# Patient Record
Sex: Male | Born: 1970 | Race: White
Health system: Southern US, Community
[De-identification: ages and names within clinical notes are randomized; demographics above are authoritative.]

## PROBLEM LIST (undated history)

## (undated) ENCOUNTER — Emergency Department
Admission: EM | Discharge status: Against Medical Advice | Payer: No Typology Code available for payment source | Attending: Emergency Medicine | Admitting: Emergency Medicine

## (undated) DIAGNOSIS — G4730 Sleep apnea, unspecified: Secondary | ICD-10-CM

## (undated) DIAGNOSIS — I10 Essential (primary) hypertension: Secondary | ICD-10-CM

## (undated) DIAGNOSIS — E785 Hyperlipidemia, unspecified: Secondary | ICD-10-CM

## (undated) DIAGNOSIS — E669 Obesity, unspecified: Secondary | ICD-10-CM

## (undated) DIAGNOSIS — I272 Pulmonary hypertension, unspecified: Secondary | ICD-10-CM

## (undated) DIAGNOSIS — J302 Other seasonal allergic rhinitis: Secondary | ICD-10-CM

## (undated) DIAGNOSIS — Z9289 Personal history of other medical treatment: Secondary | ICD-10-CM

## (undated) HISTORY — DX: Pulmonary hypertension, unspecified: I27.20

## (undated) HISTORY — DX: Sleep apnea, unspecified: G47.30

## (undated) HISTORY — DX: Other seasonal allergic rhinitis: J30.2

## (undated) HISTORY — DX: Personal history of other medical treatment: Z92.89

## (undated) HISTORY — PX: WISDOM TOOTH EXTRACTION: SHX21

## (undated) HISTORY — DX: Hyperlipidemia, unspecified: E78.5

## (undated) HISTORY — DX: Obesity, unspecified: E66.9

---

## 2001-09-05 ENCOUNTER — Ambulatory Visit (INDEPENDENT_AMBULATORY_CARE_PROVIDER_SITE_OTHER): Admit: 2001-09-05 | Disposition: A | Discharge status: Home / Self Care | Payer: Self-pay | Source: Ambulatory Visit

## 2009-02-28 DIAGNOSIS — Z9289 Personal history of other medical treatment: Secondary | ICD-10-CM

## 2009-02-28 HISTORY — DX: Personal history of other medical treatment: Z92.89

## 2009-02-28 HISTORY — PX: OTHER SURGICAL HISTORY: SHX169

## 2010-10-30 DIAGNOSIS — Z9289 Personal history of other medical treatment: Secondary | ICD-10-CM

## 2010-10-30 HISTORY — DX: Personal history of other medical treatment: Z92.89

## 2014-10-23 ENCOUNTER — Ambulatory Visit (INDEPENDENT_AMBULATORY_CARE_PROVIDER_SITE_OTHER): Payer: Self-pay | Admitting: Cardiology

## 2014-10-30 ENCOUNTER — Other Ambulatory Visit: Payer: Self-pay | Admitting: Cardiology

## 2014-10-30 DIAGNOSIS — I119 Hypertensive heart disease without heart failure: Secondary | ICD-10-CM

## 2014-10-30 DIAGNOSIS — Z9289 Personal history of other medical treatment: Secondary | ICD-10-CM

## 2014-10-30 HISTORY — DX: Personal history of other medical treatment: Z92.89

## 2014-11-06 ENCOUNTER — Other Ambulatory Visit (INDEPENDENT_AMBULATORY_CARE_PROVIDER_SITE_OTHER): Payer: Self-pay

## 2014-11-06 ENCOUNTER — Ambulatory Visit
Admission: RE | Admit: 2014-11-06 | Discharge: 2014-11-06 | Disposition: A | Discharge status: Home / Self Care | Payer: BC Managed Care – PPO | Source: Ambulatory Visit | Attending: Cardiology | Admitting: Cardiology

## 2014-11-06 DIAGNOSIS — I119 Hypertensive heart disease without heart failure: Secondary | ICD-10-CM

## 2014-11-06 LAB — VAHRT HISTORIC LVEF: Ejection Fraction: 65 %

## 2014-11-19 ENCOUNTER — Ambulatory Visit (INDEPENDENT_AMBULATORY_CARE_PROVIDER_SITE_OTHER): Payer: Self-pay

## 2014-11-19 ENCOUNTER — Ambulatory Visit (INDEPENDENT_AMBULATORY_CARE_PROVIDER_SITE_OTHER): Payer: Self-pay | Admitting: Nurse Practitioner

## 2014-11-26 ENCOUNTER — Ambulatory Visit (INDEPENDENT_AMBULATORY_CARE_PROVIDER_SITE_OTHER): Payer: Self-pay

## 2014-12-13 ENCOUNTER — Ambulatory Visit (INDEPENDENT_AMBULATORY_CARE_PROVIDER_SITE_OTHER): Payer: Self-pay | Admitting: Cardiovascular Disease

## 2015-11-28 ENCOUNTER — Ambulatory Visit (INDEPENDENT_AMBULATORY_CARE_PROVIDER_SITE_OTHER): Payer: Self-pay | Admitting: Cardiology

## 2015-12-12 ENCOUNTER — Ambulatory Visit (INDEPENDENT_AMBULATORY_CARE_PROVIDER_SITE_OTHER): Payer: Self-pay

## 2016-05-20 ENCOUNTER — Ambulatory Visit (INDEPENDENT_AMBULATORY_CARE_PROVIDER_SITE_OTHER): Payer: Self-pay

## 2016-06-09 ENCOUNTER — Ambulatory Visit (INDEPENDENT_AMBULATORY_CARE_PROVIDER_SITE_OTHER): Payer: Self-pay

## 2016-06-17 ENCOUNTER — Ambulatory Visit (INDEPENDENT_AMBULATORY_CARE_PROVIDER_SITE_OTHER): Payer: Self-pay

## 2016-06-25 ENCOUNTER — Ambulatory Visit (INDEPENDENT_AMBULATORY_CARE_PROVIDER_SITE_OTHER): Payer: Self-pay

## 2016-06-30 ENCOUNTER — Ambulatory Visit (INDEPENDENT_AMBULATORY_CARE_PROVIDER_SITE_OTHER): Payer: Self-pay

## 2016-07-07 ENCOUNTER — Ambulatory Visit (INDEPENDENT_AMBULATORY_CARE_PROVIDER_SITE_OTHER): Payer: Self-pay

## 2016-07-21 ENCOUNTER — Ambulatory Visit (INDEPENDENT_AMBULATORY_CARE_PROVIDER_SITE_OTHER): Payer: Self-pay

## 2016-07-28 ENCOUNTER — Ambulatory Visit (INDEPENDENT_AMBULATORY_CARE_PROVIDER_SITE_OTHER): Payer: Self-pay

## 2016-08-05 ENCOUNTER — Ambulatory Visit (INDEPENDENT_AMBULATORY_CARE_PROVIDER_SITE_OTHER): Payer: Self-pay

## 2016-08-11 ENCOUNTER — Ambulatory Visit (INDEPENDENT_AMBULATORY_CARE_PROVIDER_SITE_OTHER): Payer: Self-pay

## 2016-09-22 ENCOUNTER — Ambulatory Visit (INDEPENDENT_AMBULATORY_CARE_PROVIDER_SITE_OTHER): Payer: Self-pay

## 2016-09-29 ENCOUNTER — Ambulatory Visit (INDEPENDENT_AMBULATORY_CARE_PROVIDER_SITE_OTHER): Payer: Self-pay

## 2016-10-06 ENCOUNTER — Ambulatory Visit (INDEPENDENT_AMBULATORY_CARE_PROVIDER_SITE_OTHER): Payer: Self-pay

## 2016-10-27 ENCOUNTER — Ambulatory Visit (INDEPENDENT_AMBULATORY_CARE_PROVIDER_SITE_OTHER): Payer: Self-pay

## 2016-10-27 ENCOUNTER — Ambulatory Visit (INDEPENDENT_AMBULATORY_CARE_PROVIDER_SITE_OTHER): Payer: Self-pay | Admitting: Cardiology

## 2017-08-10 ENCOUNTER — Emergency Department
Admission: EM | Admit: 2017-08-10 | Discharge: 2017-08-10 | Disposition: A | Discharge status: Home / Self Care | Payer: Commercial Managed Care - POS | Attending: Emergency Medicine | Admitting: Emergency Medicine

## 2017-08-10 ENCOUNTER — Emergency Department: Payer: Commercial Managed Care - POS

## 2017-08-10 DIAGNOSIS — I10 Essential (primary) hypertension: Secondary | ICD-10-CM | POA: Insufficient documentation

## 2017-08-10 DIAGNOSIS — Z79899 Other long term (current) drug therapy: Secondary | ICD-10-CM | POA: Insufficient documentation

## 2017-08-10 DIAGNOSIS — R079 Chest pain, unspecified: Secondary | ICD-10-CM

## 2017-08-10 HISTORY — DX: Essential (primary) hypertension: I10

## 2017-08-10 LAB — COMPREHENSIVE METABOLIC PANEL
ALT: 38 U/L (ref 0–55)
AST (SGOT): 22 U/L (ref 5–34)
Albumin/Globulin Ratio: 1.8 (ref 0.9–2.2)
Albumin: 4.4 g/dL (ref 3.5–5.0)
Alkaline Phosphatase: 67 U/L (ref 38–106)
Anion Gap: 10 (ref 5.0–15.0)
BUN: 22.8 mg/dL (ref 9.0–28.0)
Bilirubin, Total: 0.5 mg/dL (ref 0.2–1.2)
CO2: 23 mEq/L (ref 22–29)
Calcium: 9.6 mg/dL (ref 8.5–10.5)
Chloride: 106 mEq/L (ref 100–111)
Creatinine: 1.1 mg/dL (ref 0.7–1.3)
Globulin: 2.4 g/dL (ref 2.0–3.6)
Glucose: 87 mg/dL (ref 70–100)
Potassium: 4.1 mEq/L (ref 3.5–5.1)
Protein, Total: 6.8 g/dL (ref 6.0–8.3)
Sodium: 139 mEq/L (ref 136–145)

## 2017-08-10 LAB — PT AND APTT
PT INR: 1 (ref 0.9–1.1)
PT: 13 s (ref 12.6–15.0)
PTT: 27 s (ref 23–37)

## 2017-08-10 LAB — CBC AND DIFFERENTIAL
Absolute NRBC: 0 10*3/uL (ref 0.00–0.00)
Basophils Absolute Automated: 0.04 10*3/uL (ref 0.00–0.08)
Basophils Automated: 0.6 %
Eosinophils Absolute Automated: 0.16 10*3/uL (ref 0.00–0.44)
Eosinophils Automated: 2.3 %
Hematocrit: 38.9 % (ref 37.6–49.6)
Hgb: 13.2 g/dL (ref 12.5–17.1)
Immature Granulocytes Absolute: 0.01 10*3/uL (ref 0.00–0.07)
Immature Granulocytes: 0.1 %
Lymphocytes Absolute Automated: 2.3 10*3/uL (ref 0.42–3.22)
Lymphocytes Automated: 32.9 %
MCH: 29.8 pg (ref 25.1–33.5)
MCHC: 33.9 g/dL (ref 31.5–35.8)
MCV: 87.8 fL (ref 78.0–96.0)
MPV: 10.4 fL (ref 8.9–12.5)
Monocytes Absolute Automated: 0.44 10*3/uL (ref 0.21–0.85)
Monocytes: 6.3 %
Neutrophils Absolute: 4.05 10*3/uL (ref 1.10–6.33)
Neutrophils: 57.8 %
Nucleated RBC: 0 /100 WBC (ref 0.0–0.0)
Platelets: 222 10*3/uL (ref 142–346)
RBC: 4.43 10*6/uL (ref 4.20–5.90)
RDW: 13 % (ref 11–15)
WBC: 7 10*3/uL (ref 3.10–9.50)

## 2017-08-10 LAB — GFR: EGFR: >60

## 2017-08-10 LAB — B-TYPE NATRIURETIC PEPTIDE: B-Natriuretic Peptide: 15.9 pg/mL (ref 0.0–100.0)

## 2017-08-10 LAB — TROPONIN I
Troponin I: <0.01 ng/mL (ref 0.00–0.09)
Troponin I: <0.01 ng/mL (ref 0.00–0.09)

## 2017-08-10 LAB — MAGNESIUM: Magnesium: 2.2 mg/dL (ref 1.6–2.6)

## 2017-08-10 LAB — IHS D-DIMER: D-Dimer: 0.32 ug/mL FEU (ref 0.00–0.50)

## 2017-08-10 LAB — TSH: TSH: 2.75 u[IU]/mL (ref 0.35–4.94)

## 2017-08-10 MED ORDER — ASPIRIN 81 MG PO CHEW
324.00 mg | CHEWABLE_TABLET | Freq: Once | ORAL | Status: AC
Start: 2017-08-10 — End: 2017-08-10
  Administered 2017-08-10: 324 mg via ORAL
  Filled 2017-08-10: qty 4

## 2017-08-10 MED ORDER — KETOROLAC TROMETHAMINE 30 MG/ML IJ SOLN
30.00 mg | Freq: Once | INTRAMUSCULAR | Status: DC
Start: 2017-08-10 — End: 2017-08-11
  Filled 2017-08-10: qty 1

## 2017-08-10 NOTE — Discharge Instructions (Signed)
Chest Pain of Unclear Etiology    You have been seen for chest pain. The cause of your pain is not yet known.    Your doctor has learned about your medical history, examined you, and checked any tests that were done. Still, it is unclear why you are having pain. The doctor thinks there is only a very small chance that your pain is caused by a life-threatening condition. Later, your primary care doctor might do more tests or check you again.    Sometimes chest pain is caused by a dangerous condition, like a heart attack, aorta injury, blood clot in the lung, or collapsed lung. It is unlikely that your pain is caused by a life-threatening condition if: Your chest pain lasts only a few seconds at a time; you are not short of breath, nauseated (sick to your stomach), sweaty, or lightheaded; your pain gets worse when you twist or bend; your pain improves with exercise or hard work.    Chest pain is serious. It is VERY IMPORTANT that you follow up with your regular doctor and seek medical attention immediately here or at the nearest Emergency Department if your symptoms become worse or they change.    YOU SHOULD SEEK MEDICAL ATTENTION IMMEDIATELY, EITHER HERE OR AT THE NEAREST EMERGENCY DEPARTMENT, IF ANY OF THE FOLLOWING OCCURS:   Your pain gets worse.   Your pain makes you short of breath, nauseated, or sweaty.   Your pain gets worse when you walk, go up stairs, or exert yourself.   You feel weak, lightheaded, or faint.   It hurts to breathe.   Your leg swells.   Your symptoms get worse or you have new symptoms or concerns.             Rest, fluids.  Tylenol or ibuprofen for pain, fever.   Daily aspirin 81 mg until cardiology recheck.  Activity as tolerated.

## 2017-08-10 NOTE — ED Triage Notes (Signed)
mid chest pain started today at 1600, worse with deep breath and pushing on chest.

## 2017-08-10 NOTE — ED Provider Notes (Signed)
Physician/Midlevel provider first contact with patient: 08/10/17 1942         History     Chief Complaint   Patient presents with   . Chest Pain       Chief Complaint:  Chest pain  Location:  Left mid-lower sternal  Onset:  1600 hrs x dop  Character:  Aching to sharp  Aggravating/Alleviating Factors:  Aggravated by coughing, direct palpation.  ass'd anxious.  No sx tx or alleviating factors  Timing:  intermittent  Environment:  driving  Severity:  Mild to moderate  Context:  Pt, drove alone to ED, c/o chest pain.  He states while driving he had sudden onset of left mid to lower sternal pain w/o radiation.  Pt felt anxious with onset of pain.  He reports increase with palpation and coughing.  No sx tx.  Pt called treating cardiology group, but did not hear back    Pt had air travel to [place] in past week.    Pt reports EST and chemical stress test in 2018, unremarkable.    PMH:  HTN, cholesterol unknown    FH:  Father with cardiac dz    Denies HA, chills, sweats, dyspnea, palps, URI sx, NVD, melena, weakness, numbness, vertigo, swelling, PND, orthopnea    PMD    Cards Kulangara, s      The history is provided by the patient and medical records. No language interpreter was used.            Past Medical History:   Diagnosis Date   . Hypertension        History reviewed. No pertinent surgical history.    History reviewed. No pertinent family history.    Social  Social History   Substance Use Topics   . Smoking status: Never Smoker   . Smokeless tobacco: Never Used   . Alcohol use Yes       .     No Known Allergies    Home Medications     Med List Status:  In Progress Set By: Otis Brace, RN at 08/10/2017  7:32 PM                lisinopril (PRINIVIL,ZESTRIL) 10 MG tablet     Take 10 mg by mouth daily.           Review of Systems   Constitutional: Negative for activity change, chills, diaphoresis and fever.   HENT: Negative for congestion, nosebleeds, rhinorrhea, sore throat, trouble swallowing and voice  change.    Eyes: Negative for pain, discharge and redness.   Respiratory: Positive for chest tightness. Negative for cough, shortness of breath and wheezing.    Cardiovascular: Negative for chest pain, palpitations and leg swelling.   Gastrointestinal: Negative for abdominal pain, blood in stool, diarrhea, nausea and vomiting.   Endocrine: Negative for polydipsia, polyphagia and polyuria.   Genitourinary: Negative for dysuria, frequency, hematuria and urgency.   Musculoskeletal: Negative for arthralgias, back pain, myalgias and neck pain.   Skin: Negative for color change, rash and wound.   Allergic/Immunologic: Negative for environmental allergies, food allergies and immunocompromised state.   Neurological: Negative for dizziness, tremors, syncope, weakness, light-headedness, numbness and headaches.   Hematological: Negative for adenopathy. Does not bruise/bleed easily.   Psychiatric/Behavioral: Negative for agitation and confusion. The patient is nervous/anxious.    All other systems reviewed and are negative.      Physical Exam    BP: (!) 141/94, Heart Rate: 73, Temp:  98 F (36.7 C), Resp Rate: 16, SpO2: 99 %, Weight: 118.4 kg    Physical Exam   Constitutional: He is oriented to person, place, and time. He appears well-developed and well-nourished. He is cooperative.  Non-toxic appearance. He does not appear ill. No distress.   HENT:   Head: Normocephalic and atraumatic.   Right Ear: External ear normal.   Left Ear: External ear normal.   Nose: Nose normal. No mucosal edema or rhinorrhea. Right sinus exhibits no maxillary sinus tenderness and no frontal sinus tenderness. Left sinus exhibits no maxillary sinus tenderness and no frontal sinus tenderness.   Mouth/Throat: Oropharynx is clear and moist. No oropharyngeal exudate, posterior oropharyngeal edema, posterior oropharyngeal erythema or tonsillar abscesses.   Eyes: Pupils are equal, round, and reactive to light. Conjunctivae and lids are normal. Right eye  exhibits no discharge. Left eye exhibits no discharge. Right conjunctiva is not injected. Left conjunctiva is not injected. No scleral icterus.   Neck: Normal range of motion. Neck supple. No thyromegaly present.   Cardiovascular: Normal rate, regular rhythm, normal heart sounds and intact distal pulses.    No murmur heard.  Pulses:       Radial pulses are 2+ on the right side.   Pulmonary/Chest: Effort normal and breath sounds normal. No respiratory distress. He has no wheezes. He has no rales. Chest wall is not dull to percussion. He exhibits tenderness. He exhibits no mass, no bony tenderness, no crepitus, no edema, no swelling and no retraction.       No splinting with respirations.   Abdominal: Soft. Bowel sounds are normal. He exhibits no distension, no abdominal bruit, no ascites, no pulsatile midline mass and no mass. There is no hepatosplenomegaly. There is no tenderness. There is no rigidity, no rebound, no guarding and no CVA tenderness. No hernia.   Musculoskeletal: Normal range of motion. He exhibits no edema or tenderness.   Lymphadenopathy:     He has no cervical adenopathy.   Neurological: He is alert and oriented to person, place, and time. No sensory deficit. He exhibits normal muscle tone. Coordination and gait normal.   Skin: Skin is warm and dry. Capillary refill takes less than 2 seconds. No abrasion, no ecchymosis, no laceration, no lesion, no petechiae and no rash noted. He is not diaphoretic. No erythema. Nails show no clubbing.   Psychiatric: He has a normal mood and affect. His speech is normal and behavior is normal. Judgment and thought content normal. Cognition and memory are normal.         MDM and ED Course     ED Medication Orders     Start Ordered     Status Ordering Provider    08/10/17 2034 08/10/17 2033    Once     Route: Intravenous  Ordered Dose: 30 mg     Discontinued Elisama Thissen A    08/10/17 1956 08/10/17 1955  aspirin chewable tablet 324 mg  Once     Route: Oral  Ordered  Dose: 324 mg     Last MAR action:  Given Chamaine Stankus A             MDM  Number of Diagnoses or Management Options  Chest pain in adult: new and requires workup  Diagnosis management comments: Plan:  IV, labs, ekg, monitoring, imaging, meds, supportive care, reassess.    2040  Patient reassessment.  Lab results reviewed.  Initial troponin is negative.  Patient is resting quietly and comfortably.  No additional complaints.  Pain is resolved.  Exam is otherwise unchanged.  Patient advised that we will need to repeat a 6 hour post-onset of symptoms.  Troponin.  He is amenable.  This will be redrawn at 2200 hrs.  On occur.  Vital signs, cardiopulmonary status.    2235  Repeat troponin is reviewed with patient.  He remains pain-free.    His questions have been answered.  He reports understanding follow-up instructions.  Strict return precautions bites.  He is ready for discharge.    The attending signature signifies review and agreement of the history , PE, evaluation, clinical impression and discharge plan except as otherwise noted.    I,Farid Grigorian, PA-C, have been the primary provider for Guy Martinez during this Emergency Dept visit.    Oxygen saturation by pulse oximetry is 95%-100%, Normal.  Interventions: None Needed.    DDX to include, but not limited to:  Cp, angina, arrhythmia, PE, CHF, costochondritis, less likely PE, pna, PTX  Plan:  Supportive care, daily 81 mg ASA, cards referral.    Results     Procedure Component Value Units Date/Time    Troponin I ([id number]) Collected:  08/10/17 2200    Specimen:  Blood Updated:  08/10/17 2232     Troponin I <0.01 ng/mL     TSH ([id number]) Collected:  08/10/17 2001    Specimen:  Blood Updated:  08/10/17 2048     TSH 2.75 uIU/mL     B-type Natriuretic Peptide ([id number]) Collected:  08/10/17 2003    Specimen:  Blood Updated:  08/10/17 2032     B-Natriuretic Peptide 15.9 pg/mL     Troponin I ([id number]) Collected:  08/10/17 1959    Specimen:  Blood Updated:  08/10/17  2032     Troponin I <0.01 ng/mL     Comprehensive metabolic panel ([id number]) Collected:  08/10/17 2001    Specimen:  Blood Updated:  08/10/17 2029     Glucose 87 mg/dL      BUN 24.4 mg/dL      Creatinine 1.1 mg/dL      Sodium 010 mEq/L      Potassium 4.1 mEq/L      Chloride 106 mEq/L      CO2 23 mEq/L      Calcium 9.6 mg/dL      Protein, Total 6.8 g/dL      Albumin 4.4 g/dL      AST (SGOT) 22 U/L      ALT 38 U/L      Alkaline Phosphatase 67 U/L      Bilirubin, Total 0.5 mg/dL      Globulin 2.4 g/dL      Albumin/Globulin Ratio 1.8     Anion Gap 10.0    Magnesium ([id number]) Collected:  08/10/17 2001    Specimen:  Blood Updated:  08/10/17 2029     Magnesium 2.2 mg/dL     GFR ([id number]) Collected:  08/10/17 2001     Updated:  08/10/17 2029     EGFR >60.0    D-Dimer ([id number]) Collected:  08/10/17 1959     Updated:  08/10/17 2023     D-Dimer 0.32 ug/mL FEU     PT/APTT ([id number]) Collected:  08/10/17 1959     Updated:  08/10/17 2023     PT 13.0 sec      PT INR 1.0     PT Anticoag. Given Within 48 hrs. None     PTT 27 sec     CBC  with differential ([id number]) Collected:  08/10/17 2001    Specimen:  Blood from Blood Updated:  08/10/17 2010     WBC 7.00 x10 3/uL      Hgb 13.2 g/dL      Hematocrit 30.8 %      Platelets 222 x10 3/uL      RBC 4.43 x10 6/uL      MCV 87.8 fL      MCH 29.8 pg      MCHC 33.9 g/dL      RDW 13 %      MPV 10.4 fL      Neutrophils 57.8 %      Lymphocytes Automated 32.9 %      Monocytes 6.3 %      Eosinophils Automated 2.3 %      Basophils Automated 0.6 %      Immature Granulocyte 0.1 %      Nucleated RBC 0.0 /100 WBC      Neutrophils Absolute 4.05 x10 3/uL      Abs Lymph Automated 2.30 x10 3/uL      Abs Mono Automated 0.44 x10 3/uL      Abs Eos Automated 0.16 x10 3/uL      Absolute Baso Automated 0.04 x10 3/uL      Absolute Immature Granulocyte 0.01 x10 3/uL      Absolute NRBC 0.00 x10 3/uL         Radiology Results (24 Hour)     Procedure Component Value Units Date/Time    Chest 2 Views  ([id number]) Collected:  08/10/17 2024    Order Status:  Completed Updated:  08/10/17 2029    Narrative:       Clinical History: Chest pain.    Findings: PA and lateral views of the chest. No prior studies are  available for comparison.    Central silhouette and pulmonary vascularity are within normal limits.     No focal consolidation or pleural effusion.    Minimal degenerative changes in the thoracic spine.      Impression:        No evidence of acute cardiopulmonary disease.    Darra Lis, MD   08/10/2017 8:25 PM        I have reviewed all labs and/or radiological studies. I have reviewed all xrays if any myself on the PACS system.      " *This note was generated by the [employer] EMR system/ Dragon speech recognition and   may contain inherent errors or omissions not intended by the user. Grammatical    errors, random word insertions, deletions, pronoun errors and incomplete   sentences are occasional consequences of this technology due to software   limitations. Not all errors are caught or corrected. If there are questions or    concerns about the content of this note or information contained within the body   of this dictation they should be addressed directly with the author for   Clarification."    Wells Score      Value   Previous DVT or PE  0   HR greater than 100  0   Surgery within 4 weeks, or Immobilization in last 3 days  0   Clinical Signs/Symptoms of DVT  0   Alternative diagnosis less likely than PE  0   Hemoptysis  0   Malignancy with treatment within 6 months or palliative care  0   Wells Score for PE  0  PERC Score      Value   Age (read only)  less than or equal to 50 years   HR >= 100  0   O2 Sat on Room Air < 95%  0   Prior History of Venous Thromboembolism  0   Trauma or Surgery within 4 weeks  0   Hemoptysis  0   Exogenous Estrogen  0   Unilateral Leg Swelling  0   PERC Rule Score (Calculated)  0      Heart Score      Value   History  0   EKG  0   Risk Factors  1   Total (with  age)  2   Onset of pain (time of START of last episode of chest pain)?  3-6 hrs ago        EKG interpreted by ED physician assistant    Rate: Normal  Rhythm: sinus rhythm  Axis: Normal  ST Segments: No acute ST segment changes  T waves: No acute T Wave changes  Conduction: No blocks  Impression: Normal EKG    No previous EKG present for comparison       Amount and/or Complexity of Data Reviewed  Clinical lab tests: ordered and reviewed  Tests in the radiology section of CPT: ordered and reviewed (  Chest 2 Views (Final result)   Result time 08/10/17 20:25:17   Final result by Betti Cruz, MD (08/10/17 20:25:17)             Impression:     No evidence of acute cardiopulmonary disease.    Darra Lis, MD   08/10/2017 8:25 PM        Narrative:     Clinical History: Chest pain.    Findings: PA and lateral views of the chest. No prior studies are  available for comparison.    Central silhouette and pulmonary vascularity are within normal limits.     No focal consolidation or pleural effusion.    Minimal degenerative changes in the thoracic spine.          )  Tests in the medicine section of CPT: ordered and reviewed  Review and summarize past medical records: yes  Discuss the patient with other providers: yes (ED case d/w attending, potluri, j, who agrees with the current course, tx, and disposition plan.)  Independent visualization of images, tracings, or specimens: yes    Risk of Complications, Morbidity, and/or Mortality  Presenting problems: high  Diagnostic procedures: high  Management options: high    Patient Progress  Patient progress: stable               Wells Score      Value   Previous DVT or PE  0   HR greater than 100  0   Surgery within 4 weeks, or Immobilization in last 3 days  0   Clinical Signs/Symptoms of DVT  0   Alternative diagnosis less likely than PE  0   Hemoptysis  0   Malignancy with treatment within 6 months or palliative care  0   Wells Score for PE  0      PERC Score       Value   Age (read only)  less than or equal to 50 years   HR >= 100  0   O2 Sat on Room Air < 95%  0   Prior History of Venous Thromboembolism  0   Trauma or Surgery within  4 weeks  0   Hemoptysis  0   Exogenous Estrogen  0   Unilateral Leg Swelling  0   PERC Rule Score (Calculated)  0      Heart Score      Value   History  0   EKG  0   Risk Factors  1   Total (with age)  2   Onset of pain (time of START of last episode of chest pain)?  3-6 hrs ago          Procedures    Clinical Impression & Disposition     Clinical Impression  Final diagnoses:   Chest pain in adult        ED Disposition     ED Disposition Condition Date/Time Comment    Discharge  Wed Aug 10, 2017 10:48 PM Guy Martinez discharge to home/self care.    Condition at disposition: Stable           Discharge Medication List as of 08/10/2017 10:49 PM                    Missi Mcmackin, Selinda Flavin, PA  08/11/17 1943       Earline Mayotte, MD  08/12/17 1707

## 2017-08-11 LAB — ECG 12-LEAD
Atrial Rate: 64 {beats}/min
P Axis: 42 degrees
P-R Interval: 186 ms
Q-T Interval: 398 ms
QRS Duration: 100 ms
QTC Calculation (Bezet): 410 ms
R Axis: 48 degrees
T Axis: 29 degrees
Ventricular Rate: 64 {beats}/min

## 2018-08-23 ENCOUNTER — Ambulatory Visit (INDEPENDENT_AMBULATORY_CARE_PROVIDER_SITE_OTHER): Payer: Self-pay | Admitting: Cardiology

## 2019-07-02 ENCOUNTER — Emergency Department: Payer: Commercial Managed Care - POS

## 2019-07-02 ENCOUNTER — Emergency Department
Admission: EM | Admit: 2019-07-02 | Discharge: 2019-07-03 | Disposition: A | Discharge status: Home / Self Care | Payer: Commercial Managed Care - POS | Attending: Emergency Medicine | Admitting: Emergency Medicine

## 2019-07-02 DIAGNOSIS — N281 Cyst of kidney, acquired: Secondary | ICD-10-CM | POA: Insufficient documentation

## 2019-07-02 DIAGNOSIS — I10 Essential (primary) hypertension: Secondary | ICD-10-CM | POA: Insufficient documentation

## 2019-07-02 DIAGNOSIS — S0031XA Abrasion of nose, initial encounter: Secondary | ICD-10-CM | POA: Insufficient documentation

## 2019-07-02 DIAGNOSIS — Z23 Encounter for immunization: Secondary | ICD-10-CM | POA: Insufficient documentation

## 2019-07-02 DIAGNOSIS — S0081XA Abrasion of other part of head, initial encounter: Secondary | ICD-10-CM

## 2019-07-02 DIAGNOSIS — K4090 Unilateral inguinal hernia, without obstruction or gangrene, not specified as recurrent: Secondary | ICD-10-CM | POA: Insufficient documentation

## 2019-07-02 DIAGNOSIS — S39012A Strain of muscle, fascia and tendon of lower back, initial encounter: Secondary | ICD-10-CM

## 2019-07-02 DIAGNOSIS — Y9323 Activity, snow (alpine) (downhill) skiing, snow boarding, sledding, tobogganing and snow tubing: Secondary | ICD-10-CM | POA: Insufficient documentation

## 2019-07-02 DIAGNOSIS — S301XXA Contusion of abdominal wall, initial encounter: Secondary | ICD-10-CM

## 2019-07-02 LAB — URINALYSIS, REFLEX TO MICROSCOPIC EXAM IF INDICATED
Bilirubin, UA: NEGATIVE
Blood, UA: NEGATIVE
Glucose, UA: NEGATIVE
Ketones UA: NEGATIVE
Leukocyte Esterase, UA: NEGATIVE
Nitrite, UA: NEGATIVE
Protein, UR: 30 — AB
Specific Gravity UA: 1.025 (ref 1.001–1.035)
Urine pH: 5 (ref 5.0–8.0)
Urobilinogen, UA: NORMAL mg/dL (ref 0.2–2.0)

## 2019-07-02 LAB — CBC WITH MANUAL DIFFERENTIAL
Absolute NRBC: 0 10*3/uL (ref 0.00–0.00)
Band Neutrophils Absolute: 0.56 10*3/uL (ref 0.00–1.00)
Band Neutrophils: 6 %
Basophils Absolute Manual: 0 10*3/uL (ref 0.00–0.08)
Basophils Manual: 0 %
Cell Morphology: NORMAL
Eosinophils Absolute Manual: 0.09 10*3/uL (ref 0.00–0.44)
Eosinophils Manual: 1 %
Hematocrit: 40.2 % (ref 37.6–49.6)
Hgb: 13.9 g/dL (ref 12.5–17.1)
Lymphocytes Absolute Manual: 2.89 10*3/uL (ref 0.42–3.22)
Lymphocytes Manual: 31 %
MCH: 30.3 pg (ref 25.1–33.5)
MCHC: 34.6 g/dL (ref 31.5–35.8)
MCV: 87.6 fL (ref 78.0–96.0)
MPV: 9.9 fL (ref 8.9–12.5)
Monocytes Absolute: 0.09 10*3/uL — ABNORMAL LOW (ref 0.21–0.85)
Monocytes Manual: 1 %
Neutrophils Absolute Manual: 5.69 10*3/uL (ref 1.10–6.33)
Nucleated RBC: 0 /100 WBC (ref 0.0–0.0)
Platelet Estimate: NORMAL
Platelets: 215 10*3/uL (ref 142–346)
RBC: 4.59 10*6/uL (ref 4.20–5.90)
RDW: 13 % (ref 11–15)
Segmented Neutrophils: 61 %
WBC: 9.32 10*3/uL (ref 3.10–9.50)

## 2019-07-02 LAB — COMPREHENSIVE METABOLIC PANEL
ALT: 49 U/L (ref 0–55)
AST (SGOT): 32 U/L (ref 5–34)
Albumin/Globulin Ratio: 2 (ref 0.9–2.2)
Albumin: 4.4 g/dL (ref 3.5–5.0)
Alkaline Phosphatase: 60 U/L (ref 38–106)
Anion Gap: 9 (ref 5.0–15.0)
BUN: 17.1 mg/dL (ref 9.0–28.0)
Bilirubin, Total: 1 mg/dL (ref 0.2–1.2)
CO2: 23 mEq/L (ref 22–29)
Calcium: 9 mg/dL (ref 8.5–10.5)
Chloride: 106 mEq/L (ref 100–111)
Creatinine: 1 mg/dL (ref 0.7–1.3)
Globulin: 2.2 g/dL (ref 2.0–3.6)
Glucose: 93 mg/dL (ref 70–100)
Potassium: 4 mEq/L (ref 3.5–5.1)
Protein, Total: 6.6 g/dL (ref 6.0–8.3)
Sodium: 138 mEq/L (ref 136–145)

## 2019-07-02 LAB — PT AND APTT
PT INR: 1 (ref 0.9–1.1)
PT: 12.8 s (ref 12.6–15.0)
PTT: 25 s (ref 23–37)

## 2019-07-02 LAB — GFR: EGFR: >60

## 2019-07-02 LAB — LIPASE: Lipase: 7 U/L — ABNORMAL LOW (ref 8–78)

## 2019-07-02 MED ORDER — FENTANYL CITRATE (PF) 50 MCG/ML IJ SOLN (WRAP)
50.00 ug | Freq: Once | INTRAMUSCULAR | Status: AC
Start: 2019-07-02 — End: 2019-07-02
  Administered 2019-07-02: 50 ug via INTRAVENOUS
  Filled 2019-07-02: qty 2

## 2019-07-02 MED ORDER — ONDANSETRON HCL 4 MG/2ML IJ SOLN
4.00 mg | Freq: Once | INTRAMUSCULAR | Status: AC
Start: 2019-07-02 — End: 2019-07-02
  Administered 2019-07-02: 4 mg via INTRAVENOUS
  Filled 2019-07-02: qty 2

## 2019-07-02 MED ORDER — MORPHINE SULFATE 2 MG/ML IJ/IV SOLN (WRAP)
4.0000 mg | Freq: Once | Status: AC
Start: 2019-07-02 — End: 2019-07-02
  Administered 2019-07-02: 4 mg via INTRAVENOUS
  Filled 2019-07-02: qty 2

## 2019-07-02 MED ORDER — KETOROLAC TROMETHAMINE 15 MG/ML IJ SOLN
15.00 mg | Freq: Once | INTRAMUSCULAR | Status: AC
Start: 2019-07-02 — End: 2019-07-02
  Administered 2019-07-02: 15 mg via INTRAVENOUS
  Filled 2019-07-02: qty 1

## 2019-07-02 MED ORDER — SODIUM CHLORIDE 0.9 % IV BOLUS
1000.00 mL | Freq: Once | INTRAVENOUS | Status: AC
Start: 2019-07-02 — End: 2019-07-03
  Administered 2019-07-02: 1000 mL via INTRAVENOUS

## 2019-07-02 MED ORDER — TETANUS-DIPHTH-ACELL PERTUSSIS 5-2.5-18.5 LF-MCG/0.5 IM SUSP
0.50 mL | Freq: Once | INTRAMUSCULAR | Status: AC
Start: 2019-07-02 — End: 2019-07-02
  Administered 2019-07-02: 0.5 mL via INTRAMUSCULAR
  Filled 2019-07-02: qty 0.5

## 2019-07-02 MED ORDER — CYCLOBENZAPRINE HCL 10 MG PO TABS
10.00 mg | ORAL_TABLET | Freq: Once | ORAL | Status: AC
Start: 2019-07-02 — End: 2019-07-02
  Administered 2019-07-02: 10 mg via ORAL
  Filled 2019-07-02: qty 1

## 2019-07-02 MED ORDER — IODIXANOL 320 MG/ML IV SOLN
100.00 mL | Freq: Once | INTRAVENOUS | Status: AC | PRN
Start: 2019-07-02 — End: 2019-07-02
  Administered 2019-07-02: 100 mL via INTRAVENOUS

## 2019-07-02 NOTE — ED Triage Notes (Signed)
Pt states he had a skiing accident. Pt states at 1430 he fell into rocks and ice boulders while going down hill. Helmet was dented but not cracked open. Redness noted to nose from goggles. C/o RLQ/LLQ pain. Stated pain 7/10-advil taken at 1700 with some relief. Patient has been questioned about the presence of weapons so that these items may be recovered and secured appropriately. Denies the current possession of any weapons.

## 2019-07-02 NOTE — ED Provider Notes (Addendum)
Nursing (triage) note reviewed for the following pertinent information:         History     Chief Complaint   Patient presents with   . ski accident     49 year old male presents to the emergency department with his wife after ski accident earlier today.  He said he was skiing on the side did not realize there is only 6 inches over a bunch of boulders.  He fell face forward.  He is unsure if he hit his head.  He has an abrasion on his nose.  His main pain is along his lower abdomen and his lower back.  No blood thinners.  Pain is moderate to severe.  Is worse with movement.  No chest pain.  No blood in his urine.  No arm or leg pain.  No neck pain.  He was wearing a helmet.    The history is provided by the patient.        Past Medical History:   Diagnosis Date   . Hypertension        History reviewed. No pertinent surgical history.    History reviewed. No pertinent family history.    Social  Social History     Tobacco Use   . Smoking status: Never Smoker   . Smokeless tobacco: Never Used   Substance Use Topics   . Alcohol use: Not Currently   . Drug use: No       .     No Known Allergies    Home Medications     Med List Status: In Progress Set By: Elberta Leatherwood, RN at 07/02/2019  8:46 PM                lisinopril (PRINIVIL,ZESTRIL) 10 MG tablet     Take 10 mg by mouth daily.           Review of Systems   Constitutional: Negative for chills and fever.   HENT: Negative for congestion, rhinorrhea and sore throat.    Respiratory: Negative for cough, chest tightness and shortness of breath.    Cardiovascular: Negative for chest pain and palpitations.   Gastrointestinal: Positive for abdominal pain. Negative for diarrhea, nausea and vomiting.   Genitourinary: Negative for dysuria and frequency.   Musculoskeletal: Positive for back pain. Negative for myalgias.   Skin: Negative for color change and rash.   Neurological: Negative for dizziness, weakness and headaches.   Psychiatric/Behavioral: Negative for confusion. The  patient is not nervous/anxious.        Physical Exam    BP: (!) 165/108, Heart Rate: 77, Temp: 97.8 F (36.6 C), Resp Rate: 16, SpO2: 99 %, Weight: 126 kg    Physical Exam  Vitals signs and nursing note reviewed.   Constitutional:       Appearance: He is well-developed.   HENT:      Head: Normocephalic.      Comments: Abrasion along bridge of nose  Eyes:      General:         Right eye: No discharge.         Left eye: No discharge.      Conjunctiva/sclera: Conjunctivae normal.      Pupils: Pupils are equal, round, and reactive to light.   Neck:      Musculoskeletal: Normal range of motion and neck supple.      Comments: Nontender full range of motion  Cardiovascular:      Rate and Rhythm: Normal  rate and regular rhythm.      Heart sounds: Normal heart sounds.   Pulmonary:      Effort: Pulmonary effort is normal. No respiratory distress.      Breath sounds: Normal breath sounds. No wheezing or rales.   Abdominal:      General: There is no distension.      Palpations: Abdomen is soft.      Tenderness: There is abdominal tenderness. There is no guarding or rebound.      Comments: Diffusely tender especially lower abdomen   Musculoskeletal: Normal range of motion.         General: No tenderness.      Comments: Tender lumbar spine, no step-offs   Skin:     General: Skin is warm and dry.   Neurological:      General: No focal deficit present.      Mental Status: He is alert and oriented to person, place, and time.      Cranial Nerves: No cranial nerve deficit.   Psychiatric:         Mood and Affect: Mood normal.         Behavior: Behavior normal.         Thought Content: Thought content normal.         Judgment: Judgment normal.           MDM and ED Course     ED Medication Orders (From admission, onward)    Start Ordered     Status Ordering Provider    07/03/19 0015 07/03/19 0014  HYDROcodone-acetaminophen (NORCO) 5-325 MG per tablet 2 tablet  Once     Route: Oral  Ordered Dose: 2 tablet     Last MAR action: Given Madelyn Tlatelpa,  Teniqua Marron H    07/03/19 0015 07/03/19 0014  ondansetron (ZOFRAN-ODT) disintegrating tablet 4 mg  Once     Route: Oral  Ordered Dose: 4 mg     Last MAR action: Given Vasilisa Vore H    07/02/19 2319 07/02/19 2318  ketorolac (TORADOL) injection 15 mg  Once     Route: Intravenous  Ordered Dose: 15 mg     Last MAR action: Given Mehgan Santmyer H    07/02/19 2309 07/02/19 2308  cyclobenzaprine (FLEXERIL) tablet 10 mg  Once     Route: Oral  Ordered Dose: 10 mg     Last MAR action: Given Jishnu Jenniges H    07/02/19 2222 07/02/19 2222  iodixanol (VISIPAQUE) 320 MG/ML injection 100 mL  IMG once as needed     Route: Intravenous  Ordered Dose: 100 mL     Last MAR action: Imaging Agent Given Siearra Amberg H    07/02/19 2200 07/02/19 2159  morphine injection 4 mg  Once     Route: Intravenous  Ordered Dose: 4 mg     Last MAR action: Given Huey Scalia H    07/02/19 2057 07/02/19 2056  tetanus-diphth-acell pertussis (BOOSTRIX) injection 0.5 mL  Once     Route: Intramuscular  Ordered Dose: 0.5 mL     Last MAR action: Given Jerre Vandrunen H    07/02/19 2056 07/02/19 2055  fentaNYL (PF) (SUBLIMAZE) injection 50 mcg  Once     Route: Intravenous  Ordered Dose: 50 mcg     Last MAR action: Given Deatrice Spanbauer H    07/02/19 2056 07/02/19 2055  ondansetron (ZOFRAN) injection 4 mg  Once     Route: Intravenous  Ordered Dose: 4 mg  Last MAR action: Given Leticia Clas    07/02/19 2056 07/02/19 2055  sodium chloride 0.9 % bolus 1,000 mL  Once     Route: Intravenous  Ordered Dose: 1,000 mL     Last MAR action: Stopped Keiston Manley H             MDM  Number of Diagnoses or Management Options  Abrasion of face, initial encounter:   Contusion of abdominal wall, initial encounter:   Cyst of left kidney:   Essential hypertension:   Injury due to skiing accident, initial encounter:   Strain of lumbar region, initial encounter:   Unilateral inguinal hernia without obstruction or gangrene, recurrence not specified:   Diagnosis management comments: Differential diagnosis: Intra-abdominal  injury, lumbar fracture, rule out head or face injury  Plan: Labs, CAT scan, supportive care    I, Nita Sells, M.D, have been the primary provider for Guy Martinez during this Emergency Dept visit.  Oxygen saturation by pulse oximetry is 95%-100%, Normal.  Interventions: Patient Observed    I wore the appropriate PPE while caring for this patient.      11:18 PM  CT scans neg for acute injury but pt is still in significant pain. Gave additional meds and consulted trauma.     Trauma consulted. They saw and examined pt and reviewed ct scans. They feel he can go home with pain meds and close f/u. They will see in their office this week. Pt will return if worsening pain or any concerns. He is aware of limitations of ct scan.  Jovita Gamma and Dr Evlyn Courier were the consultants.  Pt aware that there are incidental findings and will f/u. Given a copy of his results.       Amount and/or Complexity of Data Reviewed  Clinical lab tests: ordered and reviewed  Tests in the radiology section of CPT: ordered and reviewed  Obtain history from someone other than the patient: yes (wife)  Discuss the patient with other providers: yes (trauma)    Patient Progress  Patient progress: improved             Radiology Results (24 Hour)     Procedure Component Value Units Date/Time    CT Abd/Pelvis with IV Contrast [[id number]] Collected: 07/02/19 2253    Order Status: Completed Updated: 07/02/19 2303    Narrative:      HISTORY: Trauma while skiing with lower abdominal pain.    TECHNIQUE: CT angiogram of chest with additional CT imaging through  abdomen and pelvis with nonionic IV and oral contrast. 100 cc Visipaque  320 given. The mid generated.    COMPARISON: None    FINDINGS: Thoracic aorta is intact. Minimal dependent atelectatic  changes identified at the left lung base. The lungs are otherwise clear.  Central airways are patent. No thoracic adenopathy, pleural or  pericardial effusion. Coronary artery calcification noted. Heart size is   normal. Aorta is normal in caliber. No pneumothorax.    Simple cyst within the upper pole the left kidney measures 1.8 cm.  Kidneys are otherwise normal in appearance. Normal liver, spleen,  adrenals, pancreas,  gallbladder and biliary tree. No mass, hernia,  inflammatory process or bowel abnormality is demonstrated. There is no  abdominal or pelvic lymphadenopathy or free fluid.    Small fat-containing umbilical hernia. No herniating bowel loops. There  are bilateral small fat-containing inguinal hernias slightly larger on  the left without herniating bowel. The bladder is unremarkable. No  suspicious bony lesion.  Impression:       Minimal atelectasis at the left lung base. No additional  evidence for traumatic injury. Intact thoracic aorta. No hemoperitoneum.  Small fat-containing inguinal and umbilical hernias. No herniating  bowel.    Charlott Rakes, MD   07/02/2019 11:01 PM    CT Angio Chest with IV Contrast [[id number]] Collected: 07/02/19 2253    Order Status: Completed Updated: 07/02/19 2303    Narrative:      HISTORY: Trauma while skiing with lower abdominal pain.    TECHNIQUE: CT angiogram of chest with additional CT imaging through  abdomen and pelvis with nonionic IV and oral contrast. 100 cc Visipaque  320 given. The mid generated.    COMPARISON: None    FINDINGS: Thoracic aorta is intact. Minimal dependent atelectatic  changes identified at the left lung base. The lungs are otherwise clear.  Central airways are patent. No thoracic adenopathy, pleural or  pericardial effusion. Coronary artery calcification noted. Heart size is  normal. Aorta is normal in caliber. No pneumothorax.    Simple cyst within the upper pole the left kidney measures 1.8 cm.  Kidneys are otherwise normal in appearance. Normal liver, spleen,  adrenals, pancreas,  gallbladder and biliary tree. No mass, hernia,  inflammatory process or bowel abnormality is demonstrated. There is no  abdominal or pelvic lymphadenopathy or free  fluid.    Small fat-containing umbilical hernia. No herniating bowel loops. There  are bilateral small fat-containing inguinal hernias slightly larger on  the left without herniating bowel. The bladder is unremarkable. No  suspicious bony lesion.       Impression:       Minimal atelectasis at the left lung base. No additional  evidence for traumatic injury. Intact thoracic aorta. No hemoperitoneum.  Small fat-containing inguinal and umbilical hernias. No herniating  bowel.    Charlott Rakes, MD   07/02/2019 11:01 PM    CT Reconstruction L-spine [[id number]] Collected: 07/02/19 2252    Order Status: Completed Updated: 07/02/19 2301    Narrative:      HISTORY: 49 year old male patient with posttraumatic low back pain. Fell  while skiing.    COMPARISON: None.    TECHNIQUE: Small field-of-view axial reconstructions of the lumbar spine  were rendered retrospectively from an enhanced abdominal and pelvic CT.  Coronal and sagittal reconstructions were provided.    This CT study was performed using radiation dose reduction techniques  including one or more of the following: automated exposure control,  adjustment of the mA and/or kV according to patient size, and the use of  iterative reconstruction technique.    FINDINGS:    The lumbar spine is normally aligned on the sagittal reconstructions.  Mild lumbar levocurvature is present. The vertebral body heights are  maintained. The facets are well aligned bilaterally. The central canal  contents are unremarkable within the limits of this modality. Intrinsic  disc degeneration is present at L5-S1 with moderate disc space  narrowing, endplate irregularities, reactive sclerosis and  circumferential disc osteophyte formation. Moderate facet arthrosis is  also present. There is stenosis of the neural foramina and lateral  recesses with probable chronic impingement of the exiting L5 and  descending S1 nerve roots. Symmetric degenerative changes are present  along the sacroiliac joints  including marginal sclerosis and ventral  osteophyte formation (asymmetrically more prominent on the right).      Impression:           No acute lumbar vertebral fracture, compression deformity or traumatic  malalignment.  Waynard Edwards, MD   07/02/2019 10:59 PM    CT Maxillofacial Bones [[id number]] Collected: 07/02/19 2251    Order Status: Completed Updated: 07/02/19 2255    Narrative:      HISTORY: 49 years old, trauma  COMPARISON: None.    TECHNIQUE: Routine  Facial CT with MPR, without contrast.   Dose reduction with automatic exposure control, iterative  reconstruction, and/or adjustment of the mA and/or kV according to  patient size.  FINDINGS:  The facial bones are without acute  fracture.  The orbital bones are without acute fracture. The intraorbital soft  tissue appear without focal abnormality.      Impression:         1.  No evidence of acute fracture of the facial bones.    Alex Einar Pheasant, MD   07/02/2019 10:53 PM    CT Cervical Spine without  Contrast [[id number]] Collected: 07/02/19 2250    Order Status: Completed Updated: 07/02/19 2254    Narrative:      HISTORY: 49 year old male patient with posttraumatic cervical  tenderness.    COMPARISON: None.    TECHNIQUE: Unenhanced axial images were acquired through the cervical  spine. Coronal and sagittal reconstructions were provided.    This CT study was performed using radiation dose reduction techniques  including one or more of the following: automated exposure control,  adjustment of the mA and/or kV according to patient size, and the use of  iterative reconstruction technique.    FINDINGS:    The cervical spine is normal in alignment on both coronal and sagittal  reconstructions. The relationship of the dens to the ring of C1 is  normal and the facet joints are well aligned bilaterally. No acute  fracture is identified. No prevertebral soft tissue swelling is evident.  The central canal contents are unremarkable within the limits of this   modality. Calcification at each carotid bulb/bifurcation indicates  atherosclerosis.      Impression:           No acute cervical spine fracture or traumatic malalignment.    Waynard Edwards, MD   07/02/2019 10:52 PM    CT Head without  Contrast [[id number]] Collected: 07/02/19 2248    Order Status: Completed Updated: 07/02/19 2252    Narrative:      HISTORY: 49 year old male patient with posttraumatic headache following  fall.    COMPARISON: None.    TECHNIQUE: Serial axial images were obtained from the skull base to  vertex without intravenous contrast.    This CT study was performed using radiation dose reduction techniques  including one or more of the following: automated exposure control,  adjustment of the mA and/or kV according to patient size, and the use of  iterative reconstruction technique.    FINDINGS:    The ventricles are normal in configuration. No intracranial hemorrhage,  mass-effect or midline shift is present. No extra-axial fluid  collections are seen. There is no CT evidence of an acute cortical  infarct. The parenchyma is unremarkable in attenuation.     The calvarium is intact. The visualized paranasal sinuses and mastoids  are clear. Leftward nasal septal convexity and spurring is present. The  orbital contents are normal.      Impression:           No CT evidence of acute intracranial abnormality.     Waynard Edwards, MD   07/02/2019 10:50 PM        Results     Procedure Component  Value Units Date/Time    UA with reflex to micro (all hospital ED's and [facility], pts  3 + yrs) [[id number]]  (Abnormal) Collected: 07/02/19 2216    Specimen: Urine Updated: 07/02/19 2227     Urine Type Clean Catch     Color, UA Yellow     Clarity, UA Clear     Specific Gravity UA 1.025     Urine pH 5.0     Leukocyte Esterase, UA Negative     Nitrite, UA Negative     Protein, UR 30     Glucose, UA Negative     Ketones UA Negative     Urobilinogen, UA Normal mg/dL      Bilirubin, UA Negative     Blood,  UA Negative     RBC, UA 0-2 /hpf      WBC, UA 0-5 /hpf      Squamous Epithelial Cells, Urine 0-5 /hpf     CBC WITH MANUAL DIFFERENTIAL [[id number]]  (Abnormal) Collected: 07/02/19 2119     Updated: 07/02/19 2154     WBC 9.32 x10 3/uL      Hgb 13.9 g/dL      Hematocrit 78.2 %      Platelets 215 x10 3/uL      RBC 4.59 x10 6/uL      MCV 87.6 fL      MCH 30.3 pg      MCHC 34.6 g/dL      RDW 13 %      MPV 9.9 fL      Nucleated RBC 0.0 /100 WBC      Absolute NRBC 0.00 x10 3/uL      Segmented Neutrophils 61 %      Band Neutrophils 6 %      Lymphocytes Manual 31 %      Monocytes Manual 1 %      Eosinophils Manual 1 %      Basophils Manual 0 %      Neutrophils Absolute Manual 5.69 x10 3/uL      Band Neutrophils Absolute 0.56 x10 3/uL      Lymphocytes Absolute Manual 2.89 x10 3/uL      Monocytes Absolute 0.09 x10 3/uL      Eosinophils Absolute Manual 0.09 x10 3/uL      Basophils Absolute Manual 0.00 x10 3/uL      Cell Morphology Normal     Platelet Estimate Normal    PT/APTT [[id number]] Collected: 07/02/19 2119     Updated: 07/02/19 2149     PT 12.8 sec      PT INR 1.0     PTT 25 sec     GFR [[id number]] Collected: 07/02/19 2119     Updated: 07/02/19 2149     EGFR >60.0    Comprehensive metabolic panel [[id number]] Collected: 07/02/19 2119    Specimen: Blood Updated: 07/02/19 2149     Glucose 93 mg/dL      BUN 01.0 mg/dL      Creatinine 1.0 mg/dL      Sodium 272 mEq/L      Potassium 4.0 mEq/L      Chloride 106 mEq/L      CO2 23 mEq/L      Calcium 9.0 mg/dL      Protein, Total 6.6 g/dL      Albumin 4.4 g/dL      AST (SGOT) 32 U/L      ALT 49 U/L      Alkaline Phosphatase 60  U/L      Bilirubin, Total 1.0 mg/dL      Globulin 2.2 g/dL      Albumin/Globulin Ratio 2.0     Anion Gap 9.0    Lipase [[id number]]  (Abnormal) Collected: 07/02/19 2119    Specimen: Blood Updated: 07/02/19 2149     Lipase 7 U/L               Procedures    Clinical Impression & Disposition     Clinical Impression  Final diagnoses:   Injury due to skiing  accident, initial encounter   Contusion of abdominal wall, initial encounter   Abrasion of face, initial encounter   Strain of lumbar region, initial encounter   Essential hypertension   Unilateral inguinal hernia without obstruction or gangrene, recurrence not specified   Cyst of left kidney        ED Disposition     ED Disposition Condition Date/Time Comment    Discharge Boarder to Home  Tue Jul 03, 2019 12:06 AM            Discharge Medication List as of 07/03/2019 12:19 AM      START taking these medications    Details   cyclobenzaprine (FLEXERIL) 10 MG tablet Take 1 tablet (10 mg total) by mouth 3 (three) times daily as needed for Muscle spasms, Starting Tue 07/03/2019, E-Rx      HYDROcodone-acetaminophen (NORCO) 5-325 MG per tablet Take 1 tablet by mouth every 6 (six) hours as needed for Pain, Starting Tue 07/03/2019, Until Tue 07/10/2019, E-Rx      ondansetron (Zofran ODT) 4 MG disintegrating tablet Take 1 tablet (4 mg total) by mouth every 8 (eight) hours as needed for Nausea, Starting Tue 07/03/2019, E-Rx                       Leticia Clas, MD  07/04/19 0206       Leticia Clas, MD  07/04/19 [phone]

## 2019-07-03 MED ORDER — HYDROCODONE-ACETAMINOPHEN 5-325 MG PO TABS
1.00 | ORAL_TABLET | Freq: Four times a day (QID) | ORAL | 0 refills | Status: AC | PRN
Start: 2019-07-03 — End: 2019-07-10

## 2019-07-03 MED ORDER — HYDROCODONE-ACETAMINOPHEN 5-325 MG PO TABS
2.00 | ORAL_TABLET | Freq: Once | ORAL | Status: AC
Start: 2019-07-03 — End: 2019-07-03
  Administered 2019-07-03: 2 via ORAL
  Filled 2019-07-03: qty 2

## 2019-07-03 MED ORDER — ONDANSETRON 4 MG PO TBDP
4.00 mg | ORAL_TABLET | Freq: Once | ORAL | Status: AC
Start: 2019-07-03 — End: 2019-07-03
  Administered 2019-07-03: 4 mg via ORAL
  Filled 2019-07-03: qty 1

## 2019-07-03 MED ORDER — CYCLOBENZAPRINE HCL 10 MG PO TABS
10.0000 mg | ORAL_TABLET | Freq: Three times a day (TID) | ORAL | 0 refills | Status: DC | PRN
Start: 2019-07-03 — End: 2019-08-29

## 2019-07-03 MED ORDER — ONDANSETRON 4 MG PO TBDP
4.00 mg | ORAL_TABLET | Freq: Three times a day (TID) | ORAL | 0 refills | Status: DC | PRN
Start: 2019-07-03 — End: 2019-08-29
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
## 2019-07-03 NOTE — Discharge Instructions (Signed)
Back Pain, Lumbar NOS    You have been seen for low back pain.     This area is also called the lumbar spine.    Pain in the low back is very a common problem. This pain is usually due to overuse of the muscles, tension or a strain of the muscles. There can also be damage to the discs that cushion the bones in the spine. This can cause irritation of the nerves that exit the spinal canal in the low back.    Your doctor did not find any pain over the bones in your back (even though you might have pain in the back muscles). This means it is very unlikely that you have a broken bone in your back. Your doctor did not think it was necessary to take an x-ray.    The doctor still does not know the exact cause of your pain. Your problem does not seem to be from a dangerous cause. It is OK for you to go home today.    Some things you can try to help your back feel better are:   Apply a warm damp washcloth to the back for 20 minutes at a time, at least 4 times per day. This will reduce your pain. Massaging your back might also help.   Have someone massage the sore parts of your back.   Don't do any heavy lifting or bending. You can go back to normal daily activities if they don't make the pain worse.   Use the over-the-counter anti-inflammatory medication ibuprofen (also known as Advil or Motrin) as directed on the package to help with pain and inflammation.    It is normal for the pain to last for the next few days.     Call your doctor or go to the nearest Emergency Department if you your pain does not improve or your pain is bad enough to seriously limit your normal activities.    YOU SHOULD SEEK MEDICAL ATTENTION IMMEDIATELY, EITHER HERE OR AT THE NEAREST EMERGENCY DEPARTMENT, IF ANY OF THE FOLLOWING OCCURS:   You think the pain is coming from somewhere other than your back. This can include pelvic pain. This can be from infections in the pelvis or lower belly.   You have abdominal (belly) pain that goes  through to your back.   Your legs tingle or get numb (lose feeling).   Your legs are weak.   You have fever (temperature higher than 100.90F / 38C) along with back pain.   Your back pain is getting worse.   You lose control of your bladder or bowels. If this were to happen, it may cause you to wet or soil yourself.   You have problems urinating (peeing).   Your symptoms get worse or you have new symptoms or concerns.    If you can't follow up with your doctor, or if at any time you feel you need to be rechecked or seen again, come back here or go to the nearest emergency department.        Blunt Abdominal Trauma    You have been seen for an injury to your abdomen (belly).    Blunt trauma is a term used for impact injuries. This can happen when you hit the steering wheel or dashboard during a car accident. It can also happen from a fall or if you are hit in the belly with a fist or other object. Blunt trauma is different from penetrating trauma. Penetrating trauma  is when an object, like a bullet, enters and injures the body. The abdomen is the part of the body between the chest and the pelvis. It includes many of your body's organs. These include the stomach, intestines, pancreas, liver, spleen and bladder.    Some symptoms of blunt injury are:   Pain anywhere in the abdomen.   Pain in other parts of your body like the lower chest or shoulder. This pain is called "referred pain." It happens because the nerves that supply feeling to some of the abdominal organs also supply feeling to the chest and shoulder.   Scratches, bruises or swelling to the skin on your abdomen. This can be called a "seatbelt sign." It is a bruise over the lower belly. It happens when the seatbelt is forced tightly against the body during a crash. This sign shows up when a great amount of force is sent into the belly. It can be a sign of a more serious injury to the intestines.   Nausea (feeling sick) and vomiting (throwing  up).   Blood in the urine.    Injuries to some organs in the abdomen can usually be found when you are examined by the doctor. Even if you have normal lab work, x-rays and CT scans, there can still be injuries. Some injuries do not show up right away. This is why it is very important for you to see your doctor or go to the Emergency Department if you still have pain or the pain gets worse.    It is OK for you to go home today.    YOU SHOULD SEEK MEDICAL ATTENTION IMMEDIATELY, EITHER HERE OR AT THE NEAREST EMERGENCY DEPARTMENT, IF ANY OF THE FOLLOWING OCCUR:   Your abdominal pain gets worse.   You vomit more than two times.   You get a fever (temperature higher than 100.63F / 38C) or chills.   You have any other pains or symptoms you did not notice before.   You have any other problems or concerns.             Sedating Medication (Edu)    You have been given a medicine or prescription for medicine that causes drowsiness (sleepiness) or dizziness.    While taking this medicine:   Do not drink alcohol or take any take any tranquilizers or sleeping pills.   Do not drive or use heavy machinery.    Do not ride a bicycle.   Do not perform jobs where you need to be alert.   Do not make important decisions or sign any legal documents.    After taking sedating medicine, you have a bigger risk of falling. You may need help with things you don't normally need help with. Falls can result in serious injury.     Do the following when taking this medicine to prevent a fall:   If you get dizzy, sit or lie down at the first signs. Get up slowly.   Be careful walking and going up and down stairs. Always ask for help.   Wear your eye glasses.    Use your assistive devices such as a cane or walker.     Never give this medicine to others.    Keep this medicine out of reach of children.    Do not take or save old medicines. Throw them away when expired. To learn how to safely throw away medications, go to:  [employer].za    Keep all medicines in a cool,  dry place. Do not keep them in your bathroom medicine cabinet or in a cabinet above the stove.

## 2019-07-07 ENCOUNTER — Emergency Department: Payer: Commercial Managed Care - POS

## 2019-07-07 ENCOUNTER — Emergency Department
Admission: EM | Admit: 2019-07-07 | Discharge: 2019-07-08 | Disposition: A | Discharge status: Home / Self Care | Payer: Commercial Managed Care - POS | Attending: Emergency Medical Services | Admitting: Emergency Medical Services

## 2019-07-07 DIAGNOSIS — R1030 Lower abdominal pain, unspecified: Secondary | ICD-10-CM | POA: Insufficient documentation

## 2019-07-07 DIAGNOSIS — K5900 Constipation, unspecified: Secondary | ICD-10-CM

## 2019-07-07 DIAGNOSIS — N289 Disorder of kidney and ureter, unspecified: Secondary | ICD-10-CM | POA: Insufficient documentation

## 2019-07-07 DIAGNOSIS — I10 Essential (primary) hypertension: Secondary | ICD-10-CM

## 2019-07-07 DIAGNOSIS — N281 Cyst of kidney, acquired: Secondary | ICD-10-CM | POA: Insufficient documentation

## 2019-07-07 NOTE — ED Provider Notes (Signed)
History     Chief Complaint   Patient presents with   . Abdominal Pain   . Constipation     49 year old male presents with complaints of constipation since Monday.  Constipation was slow onset, initially moderate now mild with minimal passing of some hard stool pellets after taking milk of magnesia and Dulcolax pills and suppositories, likely made worse secondary to use of Norco, associated with some cramping lower abdominal pain that is in the same location as when he injured his abdomen during skiing, pain is nonradiating, constipation is constant, gradually improving.  Denies f/c/n/v/d/chest pain/sob/headahce/photophobia/rash/focal neuro deficits/loc/neck pain    The history is provided by the patient and medical records. No language interpreter was used.   Abdominal Pain  Associated symptoms: constipation    Associated symptoms: no chest pain, no chills, no cough, no diarrhea, no dysuria, no fever, no hematuria, no nausea, no shortness of breath, no sore throat and no vomiting    Constipation  Associated symptoms: abdominal pain    Associated symptoms: no back pain, no diarrhea, no dysuria, no fever, no nausea and no vomiting         Past Medical History:   Diagnosis Date   . Hypertension        No past surgical history on file.    No family history on file.    Social  Social History     Tobacco Use   . Smoking status: Never Smoker   . Smokeless tobacco: Never Used   Substance Use Topics   . Alcohol use: Not Currently   . Drug use: No       .     No Known Allergies    Home Medications     Med List Status: In Progress Set By: Llana Aliment, RN at 07/07/2019 10:23 PM                cyclobenzaprine (FLEXERIL) 10 MG tablet     Take 1 tablet (10 mg total) by mouth 3 (three) times daily as needed for Muscle spasms     HYDROcodone-acetaminophen (NORCO) 5-325 MG per tablet     Take 1 tablet by mouth every 6 (six) hours as needed for Pain     lisinopril (PRINIVIL,ZESTRIL) 10 MG tablet     Take 10 mg by mouth  daily.     ondansetron (Zofran ODT) 4 MG disintegrating tablet     Take 1 tablet (4 mg total) by mouth every 8 (eight) hours as needed for Nausea           Review of Systems   Constitutional: Negative for chills and fever.   HENT: Negative for rhinorrhea and sore throat.    Eyes: Negative for photophobia and discharge.   Respiratory: Negative for cough and shortness of breath.    Cardiovascular: Negative for chest pain and palpitations.   Gastrointestinal: Positive for abdominal pain and constipation. Negative for diarrhea, nausea and vomiting.   Genitourinary: Negative for dysuria, frequency and hematuria.   Musculoskeletal: Negative for back pain, myalgias and neck pain.   Neurological: Negative for dizziness, syncope, weakness, light-headedness, numbness and headaches.   Psychiatric/Behavioral: Negative for confusion and suicidal ideas.       Physical Exam    BP: (!) 180/110, Heart Rate: 72, Temp: 97 F (36.1 C), Resp Rate: 18, SpO2: 97 %, Weight: 123 kg    Physical Exam  Vitals signs and nursing note reviewed.   Constitutional:       General:  He is not in acute distress.     Appearance: He is well-developed. He is not toxic-appearing or diaphoretic.   HENT:      Head: Normocephalic and atraumatic.      Mouth/Throat:      Mouth: Mucous membranes are moist.      Pharynx: No oropharyngeal exudate.   Eyes:      General: Lids are normal.         Right eye: No discharge.         Left eye: No discharge.      Conjunctiva/sclera: Conjunctivae normal.      Right eye: Right conjunctiva is not injected. No exudate.     Left eye: Left conjunctiva is not injected. No exudate.     Pupils: Pupils are equal, round, and reactive to light.   Neck:      Musculoskeletal: Full passive range of motion without pain, normal range of motion and neck supple. Normal range of motion. No spinous process tenderness or muscular tenderness.      Vascular: No carotid bruit or JVD.      Trachea: No tracheal tenderness.      Comments: htn   Cardiovascular:      Rate and Rhythm: Normal rate and regular rhythm.      Pulses: Normal pulses.      Heart sounds: Normal heart sounds.   Pulmonary:      Effort: Pulmonary effort is normal. No respiratory distress.      Breath sounds: Normal breath sounds. No stridor. No decreased breath sounds or wheezing.   Abdominal:      General: Bowel sounds are normal. There is no distension.      Palpations: Abdomen is soft. There is no mass.      Tenderness: There is no abdominal tenderness. There is no right CVA tenderness, left CVA tenderness, guarding or rebound.      Hernia: A hernia ( Reducible umbilical hernia without signs of strangulation or incarceration) is present.   Genitourinary:     Comments: Soft stool high up in the rectal vault.  No palpation of hard stool ball  Musculoskeletal: Normal range of motion.         General: No tenderness.      Comments: No calf tenderness, no Homman's sign   Skin:     General: Skin is warm.      Coloration: Skin is not pale.      Findings: No rash.   Neurological:      General: No focal deficit present.      Mental Status: He is alert and oriented to person, place, and time.      GCS: GCS eye subscore is 4. GCS verbal subscore is 5. GCS motor subscore is 6.      Cranial Nerves: Cranial nerves are intact. No cranial nerve deficit.      Sensory: Sensation is intact. No sensory deficit.      Motor: Motor function is intact.      Coordination: Coordination is intact. Coordination normal.      Deep Tendon Reflexes: Reflexes are normal and symmetric.      Reflex Scores:       Patellar reflexes are 2+ on the right side and 2+ on the left side.  Psychiatric:         Speech: Speech normal.         Behavior: Behavior normal.         Thought Content: Thought content normal.  Judgment: Judgment normal.           MDM and ED Course     ED Medication Orders (From admission, onward)    None             MDM  Number of Diagnoses or Management Options  Constipation, unspecified  constipation type:   Essential hypertension:   Renal insufficiency:   Diagnosis management comments: I, Janese Banks, have assumed care of patient.  I have completed his/her evaluation, reviewed all pertinent data, and determined his final disposition.    " *This note was generated by the Epic EMR system/ Dragon speech recognition and may contain inherent errors or omissions not intended by the user. Grammatical errors, random word insertions, deletions, pronoun errors and incomplete sentences are occasional consequences of this technology due to software limitations. Not all errors are caught or corrected. If there are questions or concerns about the content of this note or information contained within the body of this dictation they should be addressed directly with the author for clarification."      Oxygen saturation by pulse oximetry is 95%-100%, Normal.  Interventions: None Needed.     I reviewed all labs and/or radiology studies.     I reviewed old medical records including CAT scan of the abdomen pelvis from 07/02/2019 showing:    CT Abd/Pelvis with IV Contrast (UEA540) (Order [id number])   CT Angio Chest with IV Contrast (GNF621) (Order [id number])  Status: Final result  Study Result    HISTORY: Trauma while skiing with lower abdominal pain.    TECHNIQUE: CT angiogram of chest with additional CT imaging through  abdomen and pelvis with nonionic IV and oral contrast. 100 cc Visipaque  320 given. The mid generated.    COMPARISON: None    FINDINGS: Thoracic aorta is intact. Minimal dependent atelectatic  changes identified at the left lung base. The lungs are otherwise clear.  Central airways are patent. No thoracic adenopathy, pleural or  pericardial effusion. Coronary artery calcification noted. Heart size is  normal. Aorta is normal in caliber. No pneumothorax.    Simple cyst within the upper pole the left kidney measures 1.8 cm.  Kidneys are otherwise normal in appearance. Normal liver, spleen,   adrenals, pancreas,  gallbladder and biliary tree. No mass, hernia,  inflammatory process or bowel abnormality is demonstrated. There is no  abdominal or pelvic lymphadenopathy or free fluid.    Small fat-containing umbilical hernia. No herniating bowel loops. There  are bilateral small fat-containing inguinal hernias slightly larger on  the left without herniating bowel. The bladder is unremarkable. No  suspicious bony lesion.     IMPRESSION:    Minimal atelectasis at the left lung base. No additional  evidence for traumatic injury. Intact thoracic aorta. No hemoperitoneum.  Small fat-containing inguinal and umbilical hernias. No herniating  bowel.    Charlott Rakes, MD   07/02/2019 11:01 PM        I reviewed nursing recorded vitals and history including PMSFHX         When I was within 6 feet of this patient I donned the following PPE:  Surgical Mask Yes, Gloves Yes, Gown No  ; Goggles Yes; Face Shield No  , Respirator/N95 mask Yes.  The patient was wearing a mask during my evaluation Yes.    Axr shows constipation and no signs of obstruction-  Read by Dr. Jannet Askew.  X-ray reading was used in medical decision-making.    Differential diagnosis: Constipation secondary  to narcotic use, obstruction-unlikely as patient has had a CAT scan on 07/02/2019 without signs of blockage.    X-ray, meds, reeval    Abdominal x-ray show:      XR Abdomen 2 Views (Final result)  Result time 07/07/19 23:54:40  Final result by Gar Ponto, MD (07/07/19 23:54:40)             Impression:        Possible fluid is seen within the transverse colon. Small amounts of   fluid are seen within the small bowel. Possible dilated loop of small   bowel in the midabdomen. Findings possibly due to acute gastroenteritis.   Bowel obstruction is possible.     CT abdomen and pelvis with intravenous and oral contrast suggested.     Johnsie Kindred, MD   07/07/2019 11:54 PM        Narrative:     HISTORY: Constipation.     COMPARISON: CT abdomen and pelvis dated  07/02/2019     TECHNIQUE: Supine and upright views of the abdomen     FINDINGS:     LINES/TUBES: None.     LUNG BASES: The visualized lung bases are clear.     FREE AIR: No radiographic evidence of free air.     ORGANS: No organomegaly.     BOWEL: Possible fluid is seen within the transverse colon. Small   amounts of fluid are seen within the small bowel. Possible dilated loop   of small bowel in the midabdomen.     CALCIFICATIONS: No abnormal calcifications.     BONES: Unremarkable      Due to the findings on the abdominal x-ray is questionable that the patient may have developed bowel obstruction versus findings related to laxatives he has been using.  We will get CAT scan and labs.    CAT scan shows:      CT Abd/Pelvis with IV and PO Contrast (Final result)  Result time 07/08/19 01:11:50  Final result by Teresa Coombs, MD (07/08/19 01:11:50)             Impression:          No acute inflammatory process identified in the abdomen and pelvis.   Fluid fecal material within the colon could represent a diarrheal state   or laxative use given history of constipation. No significant stool   burden in the distal colon or rectum.     Demetrios Isaacs, MD   07/08/2019 1:11 AM        Narrative:     HISTORY: Abdominal pain and constipation.     COMPARISON: CT abdomen pelvis 07/02/2019     TECHNIQUE: CT abdomen and pelvis with 100 mL Omnipaque 350 IV contrast.   The following dose reduction techniques were utilized: automated   exposure control and/or adjustment of the mA and/or kV according to   patient size, and/or the use iterative reconstruction technique.     FINDINGS: The lung bases are clear. The abdominal aorta is normal   caliber. The liver, gallbladder, spleen, pancreas and adrenal glands are   within normal limits. There is a small cyst in the upper pole of the   left kidney. The kidneys enhance symmetrically without hydronephrosis.   There is no bowel wall thickening or obstruction. Fluid fecal material   is  visualized in the ascending, transverse and descending colon with   mild distention of the ascending colon. No significant stool burden in   the rectum or sigmoid  colon. There is no ascites or pneumoperitoneum.   The appendix looks normal. The bladder looks normal. There is no   suspicious or destructive osseous lesion.     CBC within normal limits, electrolytes within normal limits creatinine 1.4.    We will give patient please enema and reevaluate    After fleets enema patient had large BM and feels much better.    Discussed study results and treatment plan with patient     Will d/c home with physician f/u. Pt. Understands to return to ed if worsen or not better or have any acute concerns.                     Procedures    Clinical Impression & Disposition     Clinical Impression  Final diagnoses:   Essential hypertension   Constipation, unspecified constipation type   Renal insufficiency        ED Disposition     ED Disposition Condition Date/Time Comment    Discharge  Sun Jul 08, 2019  2:59 AM Lorenda Hatchet discharge to home/self care.    Condition at disposition: Stable           New Prescriptions    No medications on file                 Janese Banks, MD  07/08/19 0300

## 2019-07-07 NOTE — ED Triage Notes (Signed)
Pt presents to ED via walk-in c/o lower abd pain and constipation since Monday. Was seen at this facility on Monday after a skiing accident, prescribed norco, took a couple doses. Has tried ducolax, suppository and milk of magnesia with no relief.

## 2019-07-08 ENCOUNTER — Emergency Department: Payer: Commercial Managed Care - POS

## 2019-07-08 LAB — CBC AND DIFFERENTIAL
Absolute NRBC: 0 10*3/uL (ref 0.00–0.00)
Basophils Absolute Automated: 0.05 10*3/uL (ref 0.00–0.08)
Basophils Automated: 0.8 %
Eosinophils Absolute Automated: 0.18 10*3/uL (ref 0.00–0.44)
Eosinophils Automated: 3 %
Hematocrit: 39.8 % (ref 37.6–49.6)
Hgb: 13.9 g/dL (ref 12.5–17.1)
Immature Granulocytes Absolute: 0.02 10*3/uL (ref 0.00–0.07)
Immature Granulocytes: 0.3 %
Lymphocytes Absolute Automated: 2.52 10*3/uL (ref 0.42–3.22)
Lymphocytes Automated: 42.4 %
MCH: 30.3 pg (ref 25.1–33.5)
MCHC: 34.9 g/dL (ref 31.5–35.8)
MCV: 86.7 fL (ref 78.0–96.0)
MPV: 9.6 fL (ref 8.9–12.5)
Monocytes Absolute Automated: 0.48 10*3/uL (ref 0.21–0.85)
Monocytes: 8.1 %
Neutrophils Absolute: 2.7 10*3/uL (ref 1.10–6.33)
Neutrophils: 45.4 %
Nucleated RBC: 0 /100 WBC (ref 0.0–0.0)
Platelets: 241 10*3/uL (ref 142–346)
RBC: 4.59 10*6/uL (ref 4.20–5.90)
RDW: 12 % (ref 11–15)
WBC: 5.95 10*3/uL (ref 3.10–9.50)

## 2019-07-08 LAB — COMPREHENSIVE METABOLIC PANEL
ALT: 42 U/L (ref 0–55)
AST (SGOT): 25 U/L (ref 5–34)
Albumin/Globulin Ratio: 1.9 (ref 0.9–2.2)
Albumin: 4.5 g/dL (ref 3.5–5.0)
Alkaline Phosphatase: 63 U/L (ref 38–106)
Anion Gap: 10 (ref 5.0–15.0)
BUN: 19.4 mg/dL (ref 9.0–28.0)
Bilirubin, Total: 0.8 mg/dL (ref 0.2–1.2)
CO2: 27 mEq/L (ref 22–29)
Calcium: 9.2 mg/dL (ref 8.5–10.5)
Chloride: 102 mEq/L (ref 100–111)
Creatinine: 1.4 mg/dL — ABNORMAL HIGH (ref 0.7–1.3)
Globulin: 2.4 g/dL (ref 2.0–3.6)
Glucose: 96 mg/dL (ref 70–100)
Potassium: 4.1 mEq/L (ref 3.5–5.1)
Protein, Total: 6.9 g/dL (ref 6.0–8.3)
Sodium: 139 mEq/L (ref 136–145)

## 2019-07-08 LAB — GFR: EGFR: 53.8

## 2019-07-08 MED ORDER — FLEET ENEMA 7-19 GM/118ML RE ENEM
1.00 | ENEMA | Freq: Once | RECTAL | Status: AC
Start: 2019-07-08 — End: 2019-07-08
  Administered 2019-07-08: 1 via RECTAL

## 2019-07-08 MED ORDER — SODIUM CHLORIDE 0.9 % IV BOLUS
1000.00 mL | Freq: Once | INTRAVENOUS | Status: AC
Start: 2019-07-08 — End: 2019-07-08
  Administered 2019-07-08: 1000 mL via INTRAVENOUS

## 2019-07-08 MED ORDER — IOHEXOL 350 MG/ML IV SOLN
100.00 mL | Freq: Once | INTRAVENOUS | Status: AC | PRN
Start: 2019-07-08 — End: 2019-07-08
  Administered 2019-07-08: 100 mL via INTRAVENOUS

## 2019-07-08 MED ORDER — IOHEXOL 12 MG/ML PO SOLN
45.00 mL | Freq: Once | ORAL | Status: AC
Start: 2019-07-08 — End: 2019-07-08
  Administered 2019-07-08: 45 mL via ORAL

## 2019-07-08 NOTE — Discharge Instructions (Signed)
F/u with pcp or physician from [facility] family practice in 7 days as needed for re-eval. return to ed if worsen or not better or have any acute concerns.      Hypertension    You have been diagnosed with elevated blood pressure.    The medical term for high blood pressure is hypertension. Many people feel anxious or uncomfortable about being at the hospital. If you feel anxious today, this could make your blood pressure appear high, even if your blood pressure is usually normal. Check your blood pressure several more times when you are not feeling stress. Keep a record of these readings and give this information to your regular doctor. He or she will decide whether you have hypertension that requires medical treatment.    If your blood pressure becomes extremely high all of a sudden, you will probably notice symptoms. In fact, very high blood pressure is a medical emergency. Most people with hypertension have blood pressure that is only a little too high. Mild high blood pressure does not cause specific symptoms. Instead, the effects of hypertension develop slowly over time. Untreated hypertension can affect the heart, brain, kidneys, eyes, and blood vessels. Unfortunately, by the time side-effects become noticeable, the body has already been damaged. This is why hypertension is called "the silent killer!"    It is important to follow up with your regular doctor. Check your blood pressure several times in the next 1 to 2 weeks and tell your doctor about the results. It may be helpful to keep a log or a journal where you can write down your blood pressures. Note the time of day and the activity you were doing when the reading was taken.    YOU SHOULD SEEK MEDICAL ATTENTION IMMEDIATELY, EITHER HERE OR AT THE NEAREST EMERGENCY DEPARTMENT, IF ANY OF THE FOLLOWING OCCURS:   You have a headache.   You have chest pain.   You are short of breath or have trouble breathing.    You feel weak, especially on  only one side of the body.   Your symptoms get worse or you have other concerns.               Constipation    You have been diagnosed with constipation.    Constipation is one of the most common problems people have. It is especially common for those over age 87.    Constipation is caused by a few things. This includes not enough fiber in the diet. It also includes not enough regular exercise. Hypothyroidism is another possible cause. Medicines (especially narcotic pain medicines like oxycodone (Percocet), hydrocodone (Vicodin), propoxyphene (Darvocet), and codeine (Tylenol #3) can cause it. Rarely, constipation can be a symptom in colon cancer. You should follow up with your doctor.    Typical constipation symptoms are less bowel movements than usual and small hard stool. Sometimes, diarrhea happens when stool leaks over and around a blockage of hard stool. This is called "overflow incontinence."    There are many treatments for constipation. These include having more fiber in your diet. Fruits, vegetables and foods with bran are rich in fiber. One cup of bran cereal a day usually gets you the daily dietary fiber and fluids you need. A teaspoon (5 ml) of Metamucil with each meal usually works as well.    You may also need suppositories, enemas and/or pills for your constipation. Use these only after talking about them with your doctor.    YOU SHOULD SEEK  MEDICAL ATTENTION IMMEDIATELY, EITHER HERE OR AT THE NEAREST EMERGENCY DEPARTMENT, IF ANY OF THE FOLLOWING OCCURS:   Vomiting.   Fever (temperature higher than 100.4F / 38C).   Bloody or black, tarry stool.   Abdominal (belly) pain that is new, worse or that does not get better in the next 24 hours.   Cannot have a bowel movement even with laxatives, enemas and/or suppositories used as directed.   Worsening in any way or any concerns.

## 2019-07-24 ENCOUNTER — Telehealth: Payer: Self-pay

## 2019-07-24 NOTE — Telephone Encounter (Signed)
Pt reached out to office via our online request portal. PT is interested in our [place] office location and is scheduled for a consult with Dr. Selena Martinez on 2/29 @ 9:00am.   Please see below for zoom link and forms e-mail sent to pt.     From: Adriana Simas   Sent: Tuesday, July 24, 2019 1:13 PM  To: '[name]' [email or web address]>  Cc: Waggoner, Drexel G. [email or web address]>; [email or web address] [email or web address]>  Subject: Guy Martinez    Dear [name],     Please see below for your [facility] consult zoom link for your video visit, set for Friday, Feb. 29th @ 9:00am with Dr. Selena Martinez. If you are using a mobile device, copy and paste the following link into your browser.    [email or web address] [email or web address]    Please enable your video and audio in the bottom left corner once you are ready to begin.  Try out the camera and speakers on your computer or phone.  If you aren't currently calling family or friends using video software, now is a great time to start.  Make sure that you can see and hear yourself and your video partner well.  Look for a space in your house with some privacy and good lighting.  Also, earphones can help you maintain your privacy and make it easier for you to hear.     Attached you will find the required registration paperwork for your appointment.  Please complete and return via e-mail before your appointment begins.  Please note that the Communication Waiver, Release, and Indemnity Agreement and Financial Agreement for [employer] Members will only be required if you chose to enroll.  We encourage you to review them in advance and let us know if there are any questions.      Kindest regards,      Adriana Simas  Patient Relations Manager  [facility]   [address]  [place], [place] [address]  [place] [phone]  F [phone]  [email or web address]        This communication  may contain confidential and/or privileged information. Additionally, this communication may contain protected health information (PHI) that is legally protected from inappropriate disclosure by the Privacy Standards of the [employer] Portability and Accountability Act (HIPAA) and relevant [employer]. If you are not the intended recipient, please note that any dissemination, distribution or copying of this communication is strictly prohibited. If you have received this message in error, you should notify the sender immediately by telephone or by return e-mail and delete this message from your computer. Direct questions to the Personal assistant at [phone].

## 2019-07-27 ENCOUNTER — Encounter: Payer: Self-pay | Admitting: Internal Medicine

## 2019-07-27 ENCOUNTER — Encounter (INDEPENDENT_AMBULATORY_CARE_PROVIDER_SITE_OTHER): Payer: Self-pay

## 2019-07-27 NOTE — Progress Notes (Signed)
Consult for [facility]. Past medical history notable for cardiac currently on lisinopril and recent back injury skiing. Needs new patient physical and blood work.

## 2019-08-08 ENCOUNTER — Ambulatory Visit (INDEPENDENT_AMBULATORY_CARE_PROVIDER_SITE_OTHER): Payer: Commercial Managed Care - POS

## 2019-08-08 DIAGNOSIS — Z0000 Encounter for general adult medical examination without abnormal findings: Secondary | ICD-10-CM

## 2019-08-08 DIAGNOSIS — E559 Vitamin D deficiency, unspecified: Secondary | ICD-10-CM

## 2019-08-08 LAB — COMPREHENSIVE METABOLIC PANEL
ALT: 45 U/L (ref 0–55)
AST (SGOT): 29 U/L (ref 5–34)
Albumin/Globulin Ratio: 1.9 (ref 0.9–2.2)
Albumin: 4.4 g/dL (ref 3.5–5.0)
Alkaline Phosphatase: 81 U/L (ref 38–106)
Anion Gap: 10 (ref 5.0–15.0)
BUN: 18 mg/dL (ref 9.0–28.0)
Bilirubin, Total: 1 mg/dL (ref 0.2–1.2)
CO2: 22 mEq/L (ref 21–29)
Calcium: 8.8 mg/dL (ref 8.5–10.5)
Chloride: 107 mEq/L (ref 100–111)
Creatinine: 1.2 mg/dL (ref 0.5–1.5)
Globulin: 2.3 g/dL (ref 2.0–3.7)
Glucose: 97 mg/dL (ref 70–100)
Potassium: 4.4 mEq/L (ref 3.5–5.1)
Protein, Total: 6.7 g/dL (ref 6.0–8.3)
Sodium: 139 mEq/L (ref 136–145)

## 2019-08-08 LAB — URINALYSIS WITH MICROSCOPIC
Bilirubin, UA: NEGATIVE
Glucose, UA: NEGATIVE
Ketones UA: NEGATIVE
Leukocyte Esterase, UA: NEGATIVE
Nitrite, UA: NEGATIVE
Protein, UR: NEGATIVE
Specific Gravity UA: 1.024 (ref 1.001–1.035)
Urine Bacteria: NONE SEEN /hpf
Urine pH: 5.5 (ref 5.0–8.0)
Urobilinogen, UA: 0.2 (ref 0.2–2.0)

## 2019-08-08 LAB — LIPID PANEL
Cholesterol / HDL Ratio: 5.9
Cholesterol: 220 mg/dL — ABNORMAL HIGH (ref 0–199)
HDL: 37 mg/dL — ABNORMAL LOW (ref 40–9999)
LDL Calculated: 148 mg/dL — ABNORMAL HIGH (ref 0–99)
Triglycerides: 174 mg/dL — ABNORMAL HIGH (ref 34–149)
VLDL Calculated: 35 mg/dL (ref 10–40)

## 2019-08-08 LAB — VITAMIN D,25 OH,TOTAL: Vitamin D, 25 OH, Total: 13 ng/mL — ABNORMAL LOW (ref 30–100)

## 2019-08-08 LAB — HEPATITIS C ANTIBODY: Hepatitis C, AB: NONREACTIVE

## 2019-08-08 LAB — TSH: TSH: 2.72 u[IU]/mL (ref 0.35–4.94)

## 2019-08-08 LAB — HEMOLYSIS INDEX: Hemolysis Index: 13 (ref 0–18)

## 2019-08-08 LAB — PSA: Prostate Specific Antigen, Total: 0.796 ng/mL (ref 0.000–4.000)

## 2019-08-08 LAB — GFR: EGFR: >60

## 2019-08-08 NOTE — Progress Notes (Signed)
Blood draw:  Using aseptic technique, blood was drawn from the right and left antecubital without any complication. Attempt number 1. Patient tolerated procedure well. All labs will be sent to [facility] refrigerated.    -3 SSTs     -1 Lav    -1 Yellow/red top for UA    [employer] completed today.    Patient left in good condition today.

## 2019-08-15 ENCOUNTER — Ambulatory Visit: Payer: Commercial Managed Care - POS | Admitting: Internal Medicine

## 2019-08-17 ENCOUNTER — Encounter: Payer: Self-pay | Admitting: Internal Medicine

## 2019-08-29 ENCOUNTER — Other Ambulatory Visit: Payer: Self-pay | Admitting: Internal Medicine

## 2019-08-29 ENCOUNTER — Encounter: Payer: Self-pay | Admitting: Internal Medicine

## 2019-08-29 ENCOUNTER — Ambulatory Visit (INDEPENDENT_AMBULATORY_CARE_PROVIDER_SITE_OTHER): Payer: Commercial Managed Care - POS | Admitting: Internal Medicine

## 2019-08-29 VITALS — BP 152/91 | HR 71 | Resp 16 | Ht 71.0 in | Wt 277.8 lb

## 2019-08-29 DIAGNOSIS — Z0000 Encounter for general adult medical examination without abnormal findings: Secondary | ICD-10-CM

## 2019-08-29 DIAGNOSIS — M545 Low back pain: Secondary | ICD-10-CM

## 2019-08-29 DIAGNOSIS — E782 Mixed hyperlipidemia: Secondary | ICD-10-CM

## 2019-08-29 DIAGNOSIS — R319 Hematuria, unspecified: Secondary | ICD-10-CM

## 2019-08-29 DIAGNOSIS — R7989 Other specified abnormal findings of blood chemistry: Secondary | ICD-10-CM

## 2019-08-29 DIAGNOSIS — I10 Essential (primary) hypertension: Secondary | ICD-10-CM

## 2019-08-29 DIAGNOSIS — Z6838 Body mass index (BMI) 38.0-38.9, adult: Secondary | ICD-10-CM

## 2019-08-29 DIAGNOSIS — E6609 Other obesity due to excess calories: Secondary | ICD-10-CM

## 2019-08-29 DIAGNOSIS — E559 Vitamin D deficiency, unspecified: Secondary | ICD-10-CM

## 2019-08-29 DIAGNOSIS — E785 Hyperlipidemia, unspecified: Secondary | ICD-10-CM | POA: Insufficient documentation

## 2019-08-29 LAB — URINALYSIS REFLEX TO MICROSCOPIC EXAM - REFLEX TO CULTURE
Bilirubin, UA: NEGATIVE
Blood, UA: NEGATIVE
Glucose, UA: NEGATIVE
Ketones UA: NEGATIVE
Leukocyte Esterase, UA: NEGATIVE
Nitrite, UA: NEGATIVE
Protein, UR: NEGATIVE
Specific Gravity UA: 1.016 (ref 1.001–1.035)
Urine Bacteria: NONE SEEN /hpf
Urine pH: 8 (ref 5.0–8.0)
Urobilinogen, UA: 1 (ref 0.2–2.0)

## 2019-08-29 MED ORDER — ATORVASTATIN CALCIUM 10 MG PO TABS
10.0000 mg | ORAL_TABLET | Freq: Every day | ORAL | 0 refills | Status: DC
Start: 2019-08-29 — End: 2019-09-13

## 2019-08-29 NOTE — Progress Notes (Signed)
Subjective:      Patient ID: Guy Martinez is a 49 y.o. male.    Chief Complaint:  Chief Complaint   Patient presents with   . Annual Exam       HPI:  Guy Martinez is a 49 y.o. male here for first concierge physical.    Specific concerns:    Lumbar back pain. Cramping and tight. Skiing injury several weeks ago. Stretching helps and slowly getting better. No radiation or neuro symptoms associated.    Right knee point tenderss after fall. Usually when kneeling on it when getting into bed. Has been improving. Full ROM and no swelling or brusing after the fall while skiing. Was able to ski on it and has golfed on it without problems.    Past Medical History:    HTN - lisinopril, managed by [facility].     Family history of early cardiac disease - Evaluated at [facility] for non-obstructive disease.     Low Vitamin D - currently on 35,000 units weekly, should be on 50,000    Health Maintenance  Annual Cancer screenings: Does do regular testicular exams.  Derm: uses sun protection. Follow with derm annually.   Dental: Every year.    Surgical History: Reviewed and noted below  Family History: Reviewed and noted below. Father with history of early heart attack.  Social Hisory: Reviewed and noted below    Family: Lives with wife and four kids. 18, 16, 15, 50 yo. Rottie mix and another rescue.  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  Stress: High. Good oulets and support network among colleagues and friends.  Hobbies: Golf in the 33s. Kids.  Exercise: None at the moment.  Diet: Could use work.  Sleep: 6.5-7.5 hrs/night. No difficulty falling or staying asleep. Wife reports snoring has worsened with weight gain. Well rested in the AM and no daytime somnolence.        Problem List:  Patient Active Problem List   Diagnosis   . HLD (hyperlipidemia)   . Obesity   . HTN (hypertension)       Current Medications:  Current Outpatient Medications   Medication Sig Dispense Refill   . Cholecalciferol (Vitamin D) 50 MCG (2000 UT) Cap  Take 50 mcg by mouth daily     . lisinopril (ZESTRIL) 40 MG tablet Take 40 mg by mouth daily     . atorvastatin (LIPITOR) 10 MG tablet Take 1 tablet (10 mg total) by mouth daily 30 tablet 0     No current facility-administered medications for this visit.       Allergies:  No Known Allergies    Past Medical History:  Past Medical History:   Diagnosis Date   . Hypertension    . Seasonal allergic rhinitis     Seasonal - Ragweed       Past Surgical History:  History reviewed. No pertinent surgical history.    Family History:  Family History   Problem Relation Age of Onset   . Diverticulitis Mother    . Cancer Father 59        Prostate, Bladder   . Heart attack Father 45        4 MI's   . Stroke Father 7        2 strokes   . Hypertension Father    . Diabetes Father    . Hyperlipidemia Father    . Heart disease Father    . No known problems Sister    .  No known problems Brother    . No known problems Son    . No known problems Paternal Grandmother    . COPD Paternal Grandfather    . Heart attack Paternal Grandfather    . Diabetes Paternal Grandfather    . Appendicitis Son    . Asthma Son    . No known problems Son    . Pancreatic cancer Paternal Uncle 65       Social History:  Social History     Socioeconomic History   . Marital status: Married     Spouse name: Not on file   . Number of children: Not on file   . Years of education: Not on file   . Highest education level: Not on file   Occupational History   . Not on file   Tobacco Use   . Smoking status: Former Smoker     Types: Cigarettes   . Smokeless tobacco: Former Neurosurgeon     Types: Snuff     Quit date: 2010   . Tobacco comment: Social use. 5-7 cig/week   Substance and Sexual Activity   . Alcohol use: Yes     Comment: 8-10 week   . Drug use: Never   . Sexual activity: Yes     Partners: Female     Birth control/protection: None   Other Topics Concern   . Not on file   Social History Narrative   . Not on file     Social Determinants of Health     Financial Resource  Strain:    . Difficulty of Paying Living Expenses:    Food Insecurity:    . Worried About Programme researcher, broadcasting/film/video in the Last Year:    . Barista in the Last Year:    Transportation Needs:    . Freight forwarder (Medical):    Marland Kitchen Lack of Transportation (Non-Medical):    Physical Activity:    . Days of Exercise per Week:    . Minutes of Exercise per Session:    Stress:    . Feeling of Stress :    Social Connections:    . Frequency of Communication with Friends and Family:    . Frequency of Social Gatherings with Friends and Family:    . Attends Religious Services:    . Active Member of Clubs or Organizations:    . Attends Banker Meetings:    Marland Kitchen Marital Status:    Intimate Partner Violence:    . Fear of Current or Ex-Partner:    . Emotionally Abused:    Marland Kitchen Physically Abused:    . Sexually Abused:        The following sections were reviewed this encounter by the provider: Meds           ROS:  Review of Systems   Constitutional: Negative for chills, fatigue and fever.   HENT: Negative for congestion, ear discharge, ear pain, hearing loss, postnasal drip, sinus pressure, sneezing, sore throat and tinnitus.    Eyes: Negative for pain, discharge and visual disturbance.   Respiratory: Negative for cough, chest tightness, shortness of breath and wheezing.    Cardiovascular: Negative for chest pain, palpitations and leg swelling.   Gastrointestinal: Negative for abdominal pain, blood in stool, constipation, diarrhea, nausea and vomiting.   Genitourinary: Negative for decreased urine volume, difficulty urinating, discharge, flank pain, frequency, genital sores, hematuria, penile pain, penile swelling, scrotal swelling, testicular pain and urgency.  Musculoskeletal: Positive for back pain. Negative for arthralgias, gait problem, joint swelling, myalgias, neck pain and neck stiffness.   Skin: Negative for color change, pallor, rash and wound.   Neurological: Negative for dizziness, seizures, syncope,  weakness, light-headedness, numbness and headaches.       Vitals:  BP (!) 152/91 (BP Site: Left arm, Patient Position: Sitting, Cuff Size: Large)   Pulse 71   Resp 16   Ht 1.803 m (5\' 11" )   Wt 126 kg (277 lb 12.8 oz)   SpO2 97%   BMI 38.75 kg/m      Objective:     Physical Exam:  Physical Exam  Vitals reviewed.   Constitutional:       General: He is not in acute distress.     Appearance: Normal appearance. He is obese. He is not ill-appearing, toxic-appearing or diaphoretic.   HENT:      Head: Normocephalic and atraumatic.      Right Ear: Tympanic membrane, ear canal and external ear normal. There is no impacted cerumen.      Left Ear: Tympanic membrane, ear canal and external ear normal. There is no impacted cerumen.   Eyes:      General: No scleral icterus.        Right eye: No discharge.         Left eye: No discharge.      Extraocular Movements: Extraocular movements intact.      Conjunctiva/sclera: Conjunctivae normal.      Pupils: Pupils are equal, round, and reactive to light.   Cardiovascular:      Rate and Rhythm: Normal rate and regular rhythm.      Pulses: Normal pulses.      Heart sounds: Normal heart sounds. No murmur. No friction rub. No gallop.    Pulmonary:      Effort: Pulmonary effort is normal. No respiratory distress.      Breath sounds: No stridor. No wheezing, rhonchi or rales.   Chest:      Chest wall: No tenderness.   Abdominal:      General: Abdomen is flat. Bowel sounds are normal. There is no distension.      Palpations: Abdomen is soft. There is no mass.      Tenderness: There is no abdominal tenderness. There is no right CVA tenderness, left CVA tenderness, guarding or rebound.      Hernia: No hernia is present.   Genitourinary:     Penis: Normal.       Testes: Normal.   Musculoskeletal:         General: No swelling, tenderness, deformity or signs of injury. Normal range of motion.      Cervical back: Normal range of motion and neck supple. No rigidity or tenderness.      Right  lower leg: No edema.      Left lower leg: No edema.      Comments: Lumbar paraspinal tenderness. Some point tenderness of anterior tibula at the level of the knee on the right leg but not consistent. Otherwise full ROM of the back and knee.   Lymphadenopathy:      Cervical: No cervical adenopathy.   Skin:     General: Skin is warm and dry.      Capillary Refill: Capillary refill takes less than 2 seconds.      Comments: Extensive sun damage   Neurological:      General: No focal deficit present.      Mental  Status: He is alert and oriented to person, place, and time. Mental status is at baseline.      Cranial Nerves: No cranial nerve deficit.      Sensory: No sensory deficit.      Motor: No weakness.      Coordination: Coordination normal.      Gait: Gait normal.      Deep Tendon Reflexes: Reflexes normal.   Psychiatric:         Mood and Affect: Mood normal.         Behavior: Behavior normal.         Thought Content: Thought content normal.         Judgment: Judgment normal.          Assessment:     1. Hematuria, unspecified type  - UA/Micro reflex Culture Routine    2. Low vitamin D level  - Vitamin D,25 OH, Total; Future    3. Mixed hyperlipidemia  - atorvastatin (LIPITOR) 10 MG tablet; Take 1 tablet (10 mg total) by mouth daily  Dispense: 30 tablet; Refill: 0  - Comprehensive metabolic panel; Future  - Lipid panel; Future  - Comprehensive metabolic panel; Future    4. Class 2 obesity due to excess calories without serious comorbidity with body mass index (BMI) of 38.0 to 38.9 in adult    5. Hypertension, unspecified type      Plan:     HTN - continue lisnopril, recheck BP at home and follow up with log.    Low Vitamin D - 50,000 units weekly with recheck of levels in one month    Hematuria - asymptomatic, rechecking UA    HLD - started on atorvastatin 10mg  daily with aggressive lifestyle changes. ASCVD risk of 7.2%    Back pain - suspect 2/2 msk. Advised stretching which has been helping    Knee pain - no joint  pain or boney tenderness. Monitor for now.    Obesity- counseled on lifestyle modifications and diet. Given info on [place] resources.    Health Maintenance  - Pending above lab work.  - Flu shot due 2021  - Shingles at 49 yo  - Pneumovax at 49 yo  - Tdap: pending notes from PCP  - Hep B/C: Negative Hep C 08/08/2019. Checking hep b surface ab  - Colonoscopy at 49 yo  - Dexa @ 49yo  - PSA normal  - Derm Follow up as outpt.    EKG: Normal Sinus Rhythm. Normal axis. Normal intervals. No ST elevations, ST depression, q waves, or inverted T waves appreciated.  Vision: poor near vision, uses readers  Hearing: Normal    Advance directive: information provided    1 month CMP and Vitamin D  3 month lipid and CMP  6 month office visit follow up      Sarina Ill, MD

## 2019-08-29 NOTE — Progress Notes (Signed)
Audiology test: Hearing test administered to patient. Went over results with patient in a general form by nurse. Results reviewed with patient by physician in detail.       Vision test:  Vision test administered to patient. Went over results with patient in a general form by nurse. Results reviewed with patient by physician in detail.       Fitness Evaluation:    Body Fat as percentage: In men, over 25% is obese, 20-25% is higher than normal, 16-20% is healthy / normal, <16% or under is considered lean / ideal.   2020 = 36.5%       Visceral Fat: Abdominal "belly" fat.Visceral fat is a type of body fat that exists in the abdomen and surrounds the internal organs. A high level of visceral fat can increase your risk for serious health problems including cardiovascular disease, types 2 diabetes, and increased blood pressure.     2020 = 20 (normal <10).     Current Exercise Program:   Discussed    Nutrition:      Discussed        Fitness Goals:   Decrease weight.   Increase muscle mass.   Decrease stress.    Decrease alcohol.     Weight Loss:  Discussed with patient that a -68.8 pound weight loss would be ideal per [employer]'s recommendations.     Patient did not meet with the trainer today to discuss nutrition and fitness regimen.     Patient Education:    Hearing recommendations were always protect your hearing.    Educated patient on importance of getting flu shot every year/season and the benefits as recommended by the CDC.    Educated patient on importance of getting yearly eye exam with an Optometrist for their eye health (e.g. Retina scans, glaucoma checks, etc).    Faxed request for records to [facility]     Emailed pt info on Engineer, mining.     Testing:  Collected urine sample, sent to [facility] for U/A and C&S.

## 2019-08-30 ENCOUNTER — Encounter: Payer: Self-pay | Admitting: Internal Medicine

## 2019-09-13 ENCOUNTER — Other Ambulatory Visit: Payer: Self-pay

## 2019-09-13 DIAGNOSIS — E782 Mixed hyperlipidemia: Secondary | ICD-10-CM

## 2019-09-13 MED ORDER — ATORVASTATIN CALCIUM 10 MG PO TABS
10.0000 mg | ORAL_TABLET | Freq: Every day | ORAL | 0 refills | Status: DC
Start: 2019-09-13 — End: 2019-10-13

## 2019-10-05 ENCOUNTER — Encounter: Payer: Self-pay | Admitting: Internal Medicine

## 2019-10-05 ENCOUNTER — Ambulatory Visit (INDEPENDENT_AMBULATORY_CARE_PROVIDER_SITE_OTHER): Payer: Commercial Managed Care - POS

## 2019-10-05 DIAGNOSIS — R7989 Other specified abnormal findings of blood chemistry: Secondary | ICD-10-CM

## 2019-10-05 DIAGNOSIS — E782 Mixed hyperlipidemia: Secondary | ICD-10-CM

## 2019-10-05 LAB — HEMOLYSIS INDEX
Hemolysis Index: 9 (ref 0–18)
Hemolysis Index: 10 (ref 0–18)

## 2019-10-05 LAB — GFR: EGFR: >60

## 2019-10-05 LAB — COMPREHENSIVE METABOLIC PANEL
ALT: 51 U/L (ref 0–55)
AST (SGOT): 30 U/L (ref 5–34)
Albumin/Globulin Ratio: 1.8 (ref 0.9–2.2)
Albumin: 4.5 g/dL (ref 3.5–5.0)
Alkaline Phosphatase: 62 U/L (ref 38–106)
Anion Gap: 8 (ref 5.0–15.0)
BUN: 20 mg/dL (ref 9.0–28.0)
Bilirubin, Total: 0.8 mg/dL (ref 0.2–1.2)
CO2: 26 mEq/L (ref 21–29)
Calcium: 9.6 mg/dL (ref 8.5–10.5)
Chloride: 106 mEq/L (ref 100–111)
Creatinine: 1.2 mg/dL (ref 0.5–1.5)
Globulin: 2.5 g/dL (ref 2.0–3.7)
Glucose: 104 mg/dL — ABNORMAL HIGH (ref 70–100)
Potassium: 4.7 mEq/L (ref 3.5–5.1)
Protein, Total: 7 g/dL (ref 6.0–8.3)
Sodium: 140 mEq/L (ref 136–145)

## 2019-10-05 LAB — VITAMIN D,25 OH,TOTAL: Vitamin D, 25 OH, Total: 38 ng/mL (ref 30–100)

## 2019-10-05 NOTE — Progress Notes (Signed)
Blood draw:  Using aseptic technique, blood was drawn from the right antecubital without any complication. Patient tolerated procedure well. All labs will be sent to [facility] refrigerated.    -1 SSTs     Patient left in good condition today.

## 2019-10-10 ENCOUNTER — Encounter: Payer: Self-pay | Admitting: Internal Medicine

## 2019-10-22 ENCOUNTER — Telehealth: Payer: Self-pay | Admitting: Internal Medicine

## 2019-10-22 NOTE — Telephone Encounter (Signed)
Called patient and request call back to schedule fasting blood work around 11/28/19 per Dr. Elmyra Ricks request.

## 2019-10-31 ENCOUNTER — Other Ambulatory Visit (INDEPENDENT_AMBULATORY_CARE_PROVIDER_SITE_OTHER): Payer: Self-pay

## 2019-10-31 ENCOUNTER — Ambulatory Visit (INDEPENDENT_AMBULATORY_CARE_PROVIDER_SITE_OTHER): Payer: Commercial Managed Care - POS | Admitting: Internal Medicine

## 2019-10-31 ENCOUNTER — Telehealth: Payer: Self-pay | Admitting: Internal Medicine

## 2019-10-31 ENCOUNTER — Encounter: Payer: Self-pay | Admitting: Internal Medicine

## 2019-10-31 VITALS — BP 142/88 | HR 69 | Resp 16

## 2019-10-31 DIAGNOSIS — I10 Essential (primary) hypertension: Secondary | ICD-10-CM

## 2019-10-31 DIAGNOSIS — R55 Syncope and collapse: Secondary | ICD-10-CM

## 2019-10-31 DIAGNOSIS — R42 Dizziness and giddiness: Secondary | ICD-10-CM

## 2019-10-31 LAB — CBC AND DIFFERENTIAL
Absolute NRBC: 0 10*3/uL (ref 0.00–0.00)
Basophils Absolute Automated: 0.06 10*3/uL (ref 0.00–0.08)
Basophils Automated: 0.9 %
Eosinophils Absolute Automated: 0.17 10*3/uL (ref 0.00–0.44)
Eosinophils Automated: 2.7 %
Hematocrit: 41.7 % (ref 37.6–49.6)
Hgb: 14.1 g/dL (ref 12.5–17.1)
Immature Granulocytes Absolute: 0.02 10*3/uL (ref 0.00–0.07)
Immature Granulocytes: 0.3 %
Lymphocytes Absolute Automated: 2.2 10*3/uL (ref 0.42–3.22)
Lymphocytes Automated: 34.4 %
MCH: 30.7 pg (ref 25.1–33.5)
MCHC: 33.8 g/dL (ref 31.5–35.8)
MCV: 90.8 fL (ref 78.0–96.0)
MPV: 9.9 fL (ref 8.9–12.5)
Monocytes Absolute Automated: 0.56 10*3/uL (ref 0.21–0.85)
Monocytes: 8.8 %
Neutrophils Absolute: 3.39 10*3/uL (ref 1.10–6.33)
Neutrophils: 52.9 %
Nucleated RBC: 0 /100 WBC (ref 0.0–0.0)
Platelets: 251 10*3/uL (ref 142–346)
RBC: 4.59 10*6/uL (ref 4.20–5.90)
RDW: 13 % (ref 11–15)
WBC: 6.4 10*3/uL (ref 3.10–9.50)

## 2019-10-31 LAB — BASIC METABOLIC PANEL
Anion Gap: 7 (ref 5.0–15.0)
BUN: 15 mg/dL (ref 9.0–28.0)
CO2: 29 mEq/L (ref 21–29)
Calcium: 9.8 mg/dL (ref 8.5–10.5)
Chloride: 107 mEq/L (ref 100–111)
Creatinine: 1.2 mg/dL (ref 0.5–1.5)
Glucose: 101 mg/dL — ABNORMAL HIGH (ref 70–100)
Potassium: 4.7 mEq/L (ref 3.5–5.1)
Sodium: 143 mEq/L (ref 136–145)

## 2019-10-31 LAB — GFR: EGFR: >60

## 2019-10-31 LAB — HEMOLYSIS INDEX: Hemolysis Index: 12 (ref 0–18)

## 2019-10-31 MED ORDER — LISINOPRIL 40 MG PO TABS
40.0000 mg | ORAL_TABLET | Freq: Every day | ORAL | 0 refills | Status: DC
Start: 2019-10-31 — End: 2020-02-06

## 2019-10-31 NOTE — Telephone Encounter (Signed)
Pt experienced three episodes of light headedness. Coming in this afternoon for evaluation at 3:45 PM.

## 2019-10-31 NOTE — Progress Notes (Signed)
Subjective:      Patient ID: Guy Martinez is a 49 y.o. male     Chief Complaint   Patient presents with   . Dizziness        Pt with same day visit for light headedness and vertigo. Pt reports it has been on going for the past 3-5 weeks. It is intermittent and usually resolves in minutes. There is a combination of light headedness and vertigo. Not always associated with positional change or exertion. No chest pain, SOB, palpitations, LOC, weakness, vision changes, diffuse numbness, or problems with coordination. Felt more concerned this AM after his most severe episode that required him to steady himself. Feels his hydration has been at baseline. History of unremarkable echo with [facility]. Has been taking lisinopril as prescribed.     Has been having some paresthesias of the 1st toes bilaterally and not associated with the above symptoms.       The following sections were reviewed this encounter by the provider:        Review of Systems   Constitutional: Negative for diaphoresis and fatigue.   Eyes: Negative for visual disturbance.   Respiratory: Negative for chest tightness and shortness of breath.    Cardiovascular: Negative for chest pain and palpitations.   Neurological: Positive for dizziness and light-headedness. Negative for seizures, syncope, facial asymmetry, speech difficulty, weakness, numbness and headaches.          BP 142/88 (BP Site: Left arm, Patient Position: Standing, Cuff Size: Large)   Pulse 69   Resp 16   SpO2 98%      Orthostatics: Diastolic did drop from seated to standing by 13 points. Other readings normal.    EKG: Normal Sinus Rhythm. Normal axis. Normal intervals. No ST elevations, ST depression, q waves, or inverted T waves appreciated.    Objective:     Physical Exam  Vitals reviewed.   Constitutional:       General: He is not in acute distress.     Appearance: Normal appearance. He is obese. He is not ill-appearing, toxic-appearing or diaphoretic.   HENT:      Head: Normocephalic and  atraumatic.      Right Ear: Tympanic membrane, ear canal and external ear normal. There is no impacted cerumen.      Left Ear: Tympanic membrane, ear canal and external ear normal.   Eyes:      General:         Right eye: No discharge.         Left eye: No discharge.      Extraocular Movements: Extraocular movements intact.      Conjunctiva/sclera: Conjunctivae normal.      Pupils: Pupils are equal, round, and reactive to light.   Cardiovascular:      Rate and Rhythm: Normal rate and regular rhythm.      Pulses: Normal pulses.      Heart sounds: Normal heart sounds. No murmur. No friction rub. No gallop.    Pulmonary:      Effort: Pulmonary effort is normal. No respiratory distress.      Breath sounds: No stridor. No wheezing, rhonchi or rales.   Chest:      Chest wall: No tenderness.   Skin:     General: Skin is warm and dry.      Capillary Refill: Capillary refill takes less than 2 seconds.   Neurological:      General: No focal deficit present.  Mental Status: He is alert and oriented to person, place, and time. Mental status is at baseline.      Cranial Nerves: No cranial nerve deficit.      Sensory: No sensory deficit.      Motor: No weakness.      Coordination: Coordination normal.      Gait: Gait normal.      Comments: Negative Dix Hallpike Maneuver    CN 2-12 intact. Sensation grossly intact. Strength 5+ diffusely. Normal finger to nose bilaterally. Negative dysdiadochokinesis. Normal tandem gait. Negative pronator drift.    Slightly reduced sensation of the 1st toe bilaterally under the bottom. Strength intact.        Assessment:     1. Dizziness  - CBC without differential; Future  - Basic Metabolic Panel; Future  - CBC and differential; Future  - Basic Metabolic Panel; Future  - CBC and differential  - Basic Metabolic Panel    2. Near syncope  - CBC without differential; Future  - Basic Metabolic Panel; Future  - CBC and differential; Future  - Basic Metabolic Panel; Future  - CBC and differential  -  Basic Metabolic Panel    3. Vertigo  - CBC without differential; Future  - Basic Metabolic Panel; Future  - CBC and differential; Future  - Basic Metabolic Panel; Future  - CBC and differential  - Basic Metabolic Panel        Plan:     Light headedness and vertigo  - Unclear etiology at this time with normal neuro exam and unremarkable EKG.  - Diastolic did drop 13 points from seated to standing. Asked to hold BP meds for now and hydrate.  - Will refer to cardiology for further evaluation. Pending that work up, consider neurology evaluation.  - Advise to monitor for worsening or new symptoms that were listed in the HPI and if any are noted, needs immediate ER evaluation.    Sarina Ill, MD

## 2019-10-31 NOTE — Progress Notes (Signed)
Blood Draw:  . Fasting? = no  . Location of stick: right AC using aseptic technique.  . Outcome: Patient tolerated well without any complications. Attempt # 1.    . Collected:   o 1 Gold/Tiger top tubes.  o 1 Lavender EDTA top tubes.    . Lab/Temperature: refrigerated and sent to [facility]

## 2019-11-01 ENCOUNTER — Encounter: Payer: Self-pay | Admitting: Internal Medicine

## 2019-11-01 ENCOUNTER — Telehealth: Payer: Self-pay | Admitting: Internal Medicine

## 2019-11-01 NOTE — Telephone Encounter (Signed)
Left a message that my chart message was sent.    "  Dear Guy Martinez,    I'm sorry to hear that you cannot make the appt tomorrow. I would have preferred you be seen sooner rather than later, esp since the next appt is so far out. Your work up with me is so far negative and the next step would be a cardiology evaluation. Since your symptoms are better today I think we do have some time to wait but if they worsen or become more frequent, we would have to expedite the work up with an ER evaluation. You can reach out to [facility] and schedule with them as soon as you can.    Sincerely,    Sarina Ill  3:38 PM 11/01/2019  "

## 2019-11-01 NOTE — Telephone Encounter (Signed)
Patient called to inform our office he cannot make the cardiology appointment for tomorrow (11/02/19). He has already contacted their office to cancel the appointment.    Informed patient that there is no appointment sooner than 2 weeks and this would be in [place]. All other appointments with either [facility] or [facility] is booked out for another 3 weeks to 1 month away.     Patient would like to know next steps since he is unable to go to his cardiology appointment.     Please advise.

## 2019-11-01 NOTE — Telephone Encounter (Signed)
There was no appointments for [facility] or Dr. Delena Bali office for another 2 weeks.     Patient is scheduled at Dr. Teressa Lower office [facility] [place] tomorrow, 11/02/19, at 8:30 AM. Left detailed VM for patient and sending e-mail as well to inform him of appointment date, time, address, and phone number.

## 2019-11-01 NOTE — Telephone Encounter (Signed)
Called about lab work. Normal CBC and BMP. Pt reports symptoms still occur but are more mild today. No new symptoms. Asked to follow up if anything changes. Attempting to make app with [facility].

## 2019-11-02 ENCOUNTER — Encounter: Payer: Self-pay | Admitting: Internal Medicine

## 2019-11-06 ENCOUNTER — Encounter: Payer: Self-pay | Admitting: Internal Medicine

## 2019-11-22 ENCOUNTER — Ambulatory Visit (FREE_STANDING_LABORATORY_FACILITY): Payer: Commercial Managed Care - POS

## 2019-11-22 ENCOUNTER — Telehealth: Payer: Self-pay

## 2019-11-22 DIAGNOSIS — R05 Cough: Secondary | ICD-10-CM

## 2019-11-22 DIAGNOSIS — R059 Cough, unspecified: Secondary | ICD-10-CM

## 2019-11-22 NOTE — Telephone Encounter (Signed)
Emailed patient appointment time and address to [facility] for covid 19 testing.

## 2019-11-22 NOTE — Progress Notes (Signed)
COVID-19 test was performed via drive-up appointment. Verified ID from [facility] -- patient was symptomatic. Swab was placed into the anterior nasal region and coated for 10-15 seconds in each nostril.      Patient was notified that results may take 48-72 hours to appear in their MyChart and their PCP will call regarding the final result. Swab was processed and sent to [facility].

## 2019-11-23 ENCOUNTER — Telehealth: Payer: Self-pay | Admitting: Internal Medicine

## 2019-11-23 LAB — COVID-19 (SARS-COV-2): SARS CoV 2 Overall Result: NOT DETECTED

## 2019-11-23 NOTE — Telephone Encounter (Signed)
Left message asking for a call back to follow up on symptoms in light of negative COVID test.

## 2019-11-26 ENCOUNTER — Telehealth: Payer: Self-pay | Admitting: Internal Medicine

## 2019-11-26 ENCOUNTER — Encounter: Payer: Self-pay | Admitting: Internal Medicine

## 2019-11-26 ENCOUNTER — Ambulatory Visit: Payer: Commercial Managed Care - POS | Admitting: Internal Medicine

## 2019-11-26 ENCOUNTER — Other Ambulatory Visit: Payer: Self-pay | Admitting: Internal Medicine

## 2019-11-26 VITALS — BP 157/93 | HR 86 | Temp 97.7°F

## 2019-11-26 DIAGNOSIS — W57XXXA Bitten or stung by nonvenomous insect and other nonvenomous arthropods, initial encounter: Secondary | ICD-10-CM

## 2019-11-26 DIAGNOSIS — R5381 Other malaise: Secondary | ICD-10-CM

## 2019-11-26 DIAGNOSIS — E782 Mixed hyperlipidemia: Secondary | ICD-10-CM

## 2019-11-26 DIAGNOSIS — R7989 Other specified abnormal findings of blood chemistry: Secondary | ICD-10-CM

## 2019-11-26 DIAGNOSIS — Z0000 Encounter for general adult medical examination without abnormal findings: Secondary | ICD-10-CM

## 2019-11-26 LAB — HEPATITIS B SURFACE ANTIBODY: HEPATITIS B SURFACE ANTIBODY: <8

## 2019-11-26 LAB — COMPREHENSIVE METABOLIC PANEL
ALT: 102 U/L — ABNORMAL HIGH (ref 0–55)
AST (SGOT): 40 U/L — ABNORMAL HIGH (ref 5–34)
Albumin/Globulin Ratio: 1.8 (ref 0.9–2.2)
Albumin: 4.3 g/dL (ref 3.5–5.0)
Alkaline Phosphatase: 75 U/L (ref 38–106)
Anion Gap: 11 (ref 5.0–15.0)
BUN: 16 mg/dL (ref 9.0–28.0)
Bilirubin, Total: 1.6 mg/dL — ABNORMAL HIGH (ref 0.2–1.2)
CO2: 22 mEq/L (ref 21–29)
Calcium: 9 mg/dL (ref 8.5–10.5)
Chloride: 102 mEq/L (ref 100–111)
Creatinine: 1.1 mg/dL (ref 0.5–1.5)
Globulin: 2.4 g/dL (ref 2.0–3.7)
Glucose: 97 mg/dL (ref 70–100)
Potassium: 4.3 mEq/L (ref 3.5–5.1)
Protein, Total: 6.7 g/dL (ref 6.0–8.3)
Sodium: 135 mEq/L — ABNORMAL LOW (ref 136–145)

## 2019-11-26 LAB — CBC AND DIFFERENTIAL
Absolute NRBC: 0 10*3/uL (ref 0.00–0.00)
Basophils Absolute Automated: 0.03 10*3/uL (ref 0.00–0.08)
Basophils Automated: 0.5 %
Eosinophils Absolute Automated: 0.02 10*3/uL (ref 0.00–0.44)
Eosinophils Automated: 0.3 %
Hematocrit: 37.7 % (ref 37.6–49.6)
Hgb: 12.9 g/dL (ref 12.5–17.1)
Immature Granulocytes Absolute: 0.02 10*3/uL (ref 0.00–0.07)
Immature Granulocytes: 0.3 %
Lymphocytes Absolute Automated: 1.1 10*3/uL (ref 0.42–3.22)
Lymphocytes Automated: 16.7 %
MCH: 29.7 pg (ref 25.1–33.5)
MCHC: 34.2 g/dL (ref 31.5–35.8)
MCV: 86.9 fL (ref 78.0–96.0)
MPV: 9.8 fL (ref 8.9–12.5)
Monocytes Absolute Automated: 0.61 10*3/uL (ref 0.21–0.85)
Monocytes: 9.2 %
Neutrophils Absolute: 4.82 10*3/uL (ref 1.10–6.33)
Neutrophils: 73 %
Nucleated RBC: 0 /100 WBC (ref 0.0–0.0)
Platelets: 218 10*3/uL (ref 142–346)
RBC: 4.34 10*6/uL (ref 4.20–5.90)
RDW: 13 % (ref 11–15)
WBC: 6.6 10*3/uL (ref 3.10–9.50)

## 2019-11-26 LAB — LIPID PANEL
Cholesterol / HDL Ratio: 7.4
Cholesterol: 221 mg/dL — ABNORMAL HIGH (ref 0–199)
HDL: 30 mg/dL — ABNORMAL LOW (ref 40–9999)
LDL Calculated: 156 mg/dL — ABNORMAL HIGH (ref 0–99)
Triglycerides: 176 mg/dL — ABNORMAL HIGH (ref 34–149)
VLDL Calculated: 35 mg/dL (ref 10–40)

## 2019-11-26 LAB — LYME AB, TOTAL,REFLEX TO WESTERN BLOT (IGG & IGM): Lyme AB,Total,Reflx to WB(IGM): 0.3

## 2019-11-26 LAB — GFR: EGFR: >60

## 2019-11-26 LAB — HEMOLYSIS INDEX: Hemolysis Index: 3 (ref 0–18)

## 2019-11-26 MED ORDER — DOXYCYCLINE HYCLATE 100 MG PO TABS
100.00 mg | ORAL_TABLET | Freq: Two times a day (BID) | ORAL | 0 refills | Status: AC
Start: 2019-11-26 — End: 2019-12-10

## 2019-11-26 NOTE — Telephone Encounter (Signed)
Left message informing patient that I will try to call him in the AM.

## 2019-11-26 NOTE — Progress Notes (Signed)
Blood drawn from right AC using a Vacutainer blood collection set. Aseptic technique utilized. No complications noted. Attempts x 1. Dressing applied. Client tolerated well.      The following was sent to [facility]:     2 Gold/Tiger Top Tube (spun)  1 Lavender Top Tube

## 2019-11-26 NOTE — Progress Notes (Signed)
Subjective:      Patient ID: Guy Martinez is a 49 y.o. male.    Chief Complaint:  Chief Complaint   Patient presents with   . Lyme Disease       HPI:  Malaise and fatigue that started earlier last week. Negative covid test. Developed chills and subjective fevers for two nights. Saturday symptoms started to improve. Sunday night night sweats and malaise. Today with headache. Frontal area. No vision changes, dizziness, LOC, cough, shortness of breath, wheezing, loss of smell, loss of taste, or joint pain. Did have some nausea today with headaches. No sick contacts. Patient was golfing 2 weeks ago and noticed a large rash over his right hip with a central bite in the area of redness around it with possible clearing. Approximately 6 inches in diameter. Does not recall removing a tick and has no bleeding or discharge at the site.      Problem List:  Patient Active Problem List   Diagnosis   . HLD (hyperlipidemia)   . Obesity   . HTN (hypertension)       Current Medications:  Current Outpatient Medications   Medication Sig Dispense Refill   . Cholecalciferol (Vitamin D) 50 MCG (2000 UT) Cap Take 50 mcg by mouth daily     . lisinopril (ZESTRIL) 40 MG tablet Take 1 tablet (40 mg total) by mouth daily 90 tablet 0   . doxycycline (VIBRA-TABS) 100 MG tablet Take 1 tablet (100 mg total) by mouth 2 (two) times daily for 14 days 28 tablet 0     No current facility-administered medications for this visit.       Allergies:  No Known Allergies    Past Medical History:  Past Medical History:   Diagnosis Date   . Hypertension    . Seasonal allergic rhinitis     Seasonal - Ragweed       Past Surgical History:  No past surgical history on file.    Family History:  Family History   Problem Relation Age of Onset   . Diverticulitis Mother    . Cancer Father 59        Prostate, Bladder   . Heart attack Father 45        4 MI's   . Stroke Father 48        2 strokes   . Hypertension Father    . Diabetes Father    . Hyperlipidemia Father    .  Heart disease Father    . No known problems Sister    . No known problems Brother    . No known problems Son    . No known problems Paternal Grandmother    . COPD Paternal Grandfather    . Heart attack Paternal Grandfather    . Diabetes Paternal Grandfather    . Appendicitis Son    . Asthma Son    . No known problems Son    . Pancreatic cancer Paternal Uncle 61       Social History:  Social History     Socioeconomic History   . Marital status: Married     Spouse name: Not on file   . Number of children: Not on file   . Years of education: Not on file   . Highest education level: Not on file   Occupational History   . Not on file   Tobacco Use   . Smoking status: Former Smoker     Types: Cigarettes   .  Smokeless tobacco: Former Neurosurgeon     Types: Snuff     Quit date: 2010   . Tobacco comment: Social use. 5-7 cig/week   Vaping Use   . Vaping Use: Never used   Substance and Sexual Activity   . Alcohol use: Yes     Comment: 8-10 week   . Drug use: Never   . Sexual activity: Yes     Partners: Female     Birth control/protection: None   Other Topics Concern   . Not on file   Social History Narrative   . Not on file     Social Determinants of Health     Financial Resource Strain:    . Difficulty of Paying Living Expenses:    Food Insecurity:    . Worried About Programme researcher, broadcasting/film/video in the Last Year:    . Barista in the Last Year:    Transportation Needs:    . Freight forwarder (Medical):    Marland Kitchen Lack of Transportation (Non-Medical):    Physical Activity:    . Days of Exercise per Week:    . Minutes of Exercise per Session:    Stress:    . Feeling of Stress :    Social Connections:    . Frequency of Communication with Friends and Family:    . Frequency of Social Gatherings with Friends and Family:    . Attends Religious Services:    . Active Member of Clubs or Organizations:    . Attends Banker Meetings:    Marland Kitchen Marital Status:    Intimate Partner Violence:    . Fear of Current or Ex-Partner:    .  Emotionally Abused:    Marland Kitchen Physically Abused:    . Sexually Abused:        The following sections were reviewed this encounter by the provider:        ROS:  Review of Systems   Constitutional: Positive for chills, diaphoresis, fatigue and fever. Negative for appetite change.   HENT: Positive for postnasal drip, rhinorrhea, sinus pressure, sinus pain, sneezing and sore throat.    Respiratory: Negative for cough, chest tightness, shortness of breath and wheezing.    Cardiovascular: Negative for chest pain.   Gastrointestinal: Positive for nausea. Negative for abdominal pain, constipation, diarrhea and vomiting.   Genitourinary: Negative for dysuria and flank pain.   Musculoskeletal: Positive for myalgias. Negative for arthralgias and joint swelling.   Neurological: Negative for dizziness, weakness, light-headedness and numbness.       Vitals:  BP (!) 157/93 (BP Site: Right arm, Patient Position: Sitting, Cuff Size: Medium)   Pulse 86   Temp 97.7 F (36.5 C) (Temporal)      Objective:     Physical Exam:  Physical Exam  Vitals reviewed.   Constitutional:       General: He is not in acute distress.     Appearance: Normal appearance. He is obese. He is diaphoretic. He is not ill-appearing or toxic-appearing.   HENT:      Head: Normocephalic and atraumatic.   Cardiovascular:      Rate and Rhythm: Normal rate and regular rhythm.      Pulses: Normal pulses.      Heart sounds: Normal heart sounds. No murmur heard.   No friction rub. No gallop.    Pulmonary:      Effort: Pulmonary effort is normal. No respiratory distress.      Breath sounds:  Normal breath sounds. No stridor. No wheezing, rhonchi or rales.   Chest:      Chest wall: No tenderness.   Abdominal:      General: Abdomen is flat. Bowel sounds are normal. There is no distension.      Palpations: Abdomen is soft. There is no mass.      Tenderness: There is no abdominal tenderness. There is no right CVA tenderness, left CVA tenderness, guarding or rebound.      Hernia:  No hernia is present.   Skin:     General: Skin is warm.      Comments: Large possible bull's-eye rash of the right lower quadrant.   Neurological:      Mental Status: He is alert.          Assessment:     1. Malaise  - Lyme Ab Tot Rflx to WB IGG/IGM; Future  - doxycycline (VIBRA-TABS) 100 MG tablet; Take 1 tablet (100 mg total) by mouth 2 (two) times daily for 14 days  Dispense: 28 tablet; Refill: 0  - CBC and differential; Future  - CBC and differential  - Lyme Ab Tot Rflx to WB IGG/IGM    2. Bug bite, initial encounter  - Lyme Ab Tot Rflx to WB IGG/IGM; Future  - doxycycline (VIBRA-TABS) 100 MG tablet; Take 1 tablet (100 mg total) by mouth 2 (two) times daily for 14 days  Dispense: 28 tablet; Refill: 0  - Lyme Ab Tot Rflx to WB IGG/IGM    3. Preventative health care  - Hepatitis B (HBV) Surface Antibody Quant    4. Mixed hyperlipidemia  - Comprehensive metabolic panel  - Lipid panel      Plan:     -Given rash on lower hip and generalized symptoms of malaise and negative Covid test and other obvious infectious symptoms, will need to treat for possible tickborne illnesses.  -Given doxycycline 100 mg twice daily for 14 days. Sending of Lyme disease test in addition to CBC and CMP.  -Advised of increased photosensitivity while on doxycycline.  Asked patient to follow-up if symptoms are worsening or not improving.-    Health maintenance  -Pending CMP, lipid panel, and hep B    Sarina Ill, MD

## 2019-11-26 NOTE — Addendum Note (Signed)
Addended by: Lorrin Mais on: 11/26/2019 04:47 PM     Modules accepted: Orders

## 2019-11-26 NOTE — Progress Notes (Signed)
Dr. Selena Batten wanted to add on the RMSF to this am blood draw- called [facility] and spoke with Wilmer who reported that they should be able to add that on- MD placed order and I released.

## 2019-11-27 NOTE — Telephone Encounter (Signed)
Notified pt of low sodium, elevated bilirubin, elevated LFTs, and negative lyme. Advised to continue doxycycline for now. Have added on RMSF for lab work. Will repeat lab work this week to trend out CMP.

## 2019-11-28 ENCOUNTER — Telehealth: Payer: Self-pay | Admitting: Internal Medicine

## 2019-11-28 ENCOUNTER — Encounter: Payer: Self-pay | Admitting: Internal Medicine

## 2019-11-28 DIAGNOSIS — R7989 Other specified abnormal findings of blood chemistry: Secondary | ICD-10-CM

## 2019-11-28 NOTE — Telephone Encounter (Signed)
Pt reports he is feeling significantly better. Coming in for lab work tomorrow.

## 2019-11-29 ENCOUNTER — Encounter: Payer: Self-pay | Admitting: Internal Medicine

## 2019-11-29 ENCOUNTER — Ambulatory Visit (INDEPENDENT_AMBULATORY_CARE_PROVIDER_SITE_OTHER): Payer: Commercial Managed Care - POS

## 2019-11-29 DIAGNOSIS — E6609 Other obesity due to excess calories: Secondary | ICD-10-CM

## 2019-11-29 DIAGNOSIS — Z6838 Body mass index (BMI) 38.0-38.9, adult: Secondary | ICD-10-CM

## 2019-11-29 DIAGNOSIS — E782 Mixed hyperlipidemia: Secondary | ICD-10-CM

## 2019-11-29 DIAGNOSIS — R7989 Other specified abnormal findings of blood chemistry: Secondary | ICD-10-CM

## 2019-11-29 LAB — COMPREHENSIVE METABOLIC PANEL
ALT: 69 U/L — ABNORMAL HIGH (ref 0–55)
AST (SGOT): 36 U/L — ABNORMAL HIGH (ref 5–34)
Albumin/Globulin Ratio: 1.9 (ref 0.9–2.2)
Albumin: 4.1 g/dL (ref 3.5–5.0)
Alkaline Phosphatase: 66 U/L (ref 38–106)
Anion Gap: 9 (ref 5.0–15.0)
BUN: 18 mg/dL (ref 9.0–28.0)
Bilirubin, Total: 0.6 mg/dL (ref 0.2–1.2)
CO2: 25 mEq/L (ref 21–29)
Calcium: 8.9 mg/dL (ref 8.5–10.5)
Chloride: 105 mEq/L (ref 100–111)
Creatinine: 1.2 mg/dL (ref 0.5–1.5)
Globulin: 2.2 g/dL (ref 2.0–3.7)
Glucose: 95 mg/dL (ref 70–100)
Potassium: 4.6 mEq/L (ref 3.5–5.1)
Protein, Total: 6.3 g/dL (ref 6.0–8.3)
Sodium: 139 mEq/L (ref 136–145)

## 2019-11-29 LAB — MAN DIFF ONLY
Atypical Lymphocytes %: 2 %
Atypical Lymphocytes Absolute: 0.11 10*3/uL — ABNORMAL HIGH (ref 0.00–0.00)
Band Neutrophils Absolute: 0.33 10*3/uL (ref 0.00–1.00)
Band Neutrophils: 6 %
Basophils Absolute Manual: 0.11 10*3/uL — ABNORMAL HIGH (ref 0.00–0.08)
Basophils Manual: 2 %
Eosinophils Absolute Manual: 0.38 10*3/uL (ref 0.00–0.44)
Eosinophils Manual: 7 %
Lymphocytes Absolute Manual: 2.08 10*3/uL (ref 0.42–3.22)
Lymphocytes Manual: 38 %
Metamyelocytes Absolute: 0.05 10*3/uL — ABNORMAL HIGH
Metamyelocytes: 1 %
Monocytes Absolute: 0.38 10*3/uL (ref 0.21–0.85)
Monocytes Manual: 7 %
Neutrophils Absolute Manual: 2.02 10*3/uL (ref 1.10–6.33)
Segmented Neutrophils: 37 %

## 2019-11-29 LAB — CELL MORPHOLOGY
Cell Morphology: ABNORMAL — AB
Platelet Estimate: NORMAL

## 2019-11-29 LAB — CBC AND DIFFERENTIAL
Absolute NRBC: 0 10*3/uL (ref 0.00–0.00)
Hematocrit: 37.3 % — ABNORMAL LOW (ref 37.6–49.6)
Hgb: 12.8 g/dL (ref 12.5–17.1)
MCH: 30 pg (ref 25.1–33.5)
MCHC: 34.3 g/dL (ref 31.5–35.8)
MCV: 87.4 fL (ref 78.0–96.0)
MPV: 9.7 fL (ref 8.9–12.5)
Nucleated RBC: 0 /100 WBC (ref 0.0–0.0)
Platelets: 304 10*3/uL (ref 142–346)
RBC: 4.27 10*6/uL (ref 4.20–5.90)
RDW: 13 % (ref 11–15)
WBC: 5.47 10*3/uL (ref 3.10–9.50)

## 2019-11-29 LAB — GFR: EGFR: >60

## 2019-11-29 LAB — ROCKY MOUNTAIN SPOTTED FEVER IGG/IGM WITH TITER
Rocky Mountain Spotted Fever IgG: <1:64 {titer}
Rocky Mountain Spotted Fever IgM: <1:64 {titer}

## 2019-11-29 LAB — HEMOLYSIS INDEX: Hemolysis Index: 7 (ref 0–18)

## 2019-11-29 NOTE — Progress Notes (Signed)
Blood draw:  Using aseptic technique, blood was drawn from the right antecubital without any complication. Attempt # 1. Patient tolerated procedure well. All labs will be sent to [facility] refrigerated.    -1 SSTs       -1 Lav    Patient left in good condition today.

## 2019-11-30 ENCOUNTER — Encounter: Payer: Self-pay | Admitting: Internal Medicine

## 2019-12-05 ENCOUNTER — Ambulatory Visit: Payer: Commercial Managed Care - POS

## 2019-12-12 ENCOUNTER — Ambulatory Visit (INDEPENDENT_AMBULATORY_CARE_PROVIDER_SITE_OTHER): Payer: Commercial Managed Care - POS | Admitting: Cardiology

## 2019-12-12 ENCOUNTER — Encounter (INDEPENDENT_AMBULATORY_CARE_PROVIDER_SITE_OTHER): Payer: Self-pay | Admitting: Cardiology

## 2019-12-12 VITALS — BP 132/90 | HR 72 | Ht 71.0 in | Wt 267.0 lb

## 2019-12-12 DIAGNOSIS — I10 Essential (primary) hypertension: Secondary | ICD-10-CM

## 2019-12-12 DIAGNOSIS — E7800 Pure hypercholesterolemia, unspecified: Secondary | ICD-10-CM

## 2019-12-12 DIAGNOSIS — Z8249 Family history of ischemic heart disease and other diseases of the circulatory system: Secondary | ICD-10-CM

## 2019-12-12 NOTE — Progress Notes (Signed)
[facility] PROGRESS NOTE    [facility] -CARDIOLOGY  [address]  [place] [address]  Dept: [phone]  Dept Fax: [phone]       Patient Name: Guy Martinez    Date of Visit:  December 12, 2019  Date of Birth: August 20, 1970  AGE: 49 y.o.  Medical Record #: [id number]  Requesting Physician: Guy Ill, MD      CHIEF COMPLAINT: Hypertension and Hyperlipidemia      HISTORY OF PRESENT ILLNESS:    He is a pleasant 49 y.o. male who presents today for an annual visit but also for checkup since he is worried by his dad's history of cardiovascular disease.  His father had his first myocardial event in his 28s.  Patient self denies chest pains or shortness of breath but does not exercise beyond playing golf.      PAST MEDICAL HISTORY: He has a past medical history of Hypertension and Seasonal allergic rhinitis. He has no past surgical history on file.    ALLERGIES: No Known Allergies    MEDICATIONS:   Current Outpatient Medications   Medication Sig   . Cholecalciferol (Vitamin D) 50 MCG (2000 UT) Cap Take 50 mcg by mouth daily   . lisinopril (ZESTRIL) 40 MG tablet Take 1 tablet (40 mg total) by mouth daily        FAMILY HISTORY: family history includes Appendicitis in his son; Asthma in his son; COPD in his paternal grandfather; Cancer (age of onset: 41) in his father; Diabetes in his father and paternal grandfather; Diverticulitis in his mother; Heart attack in his paternal grandfather; Heart attack (age of onset: 67) in his father; Heart disease in his father; Hyperlipidemia in his father; Hypertension in his father; No known problems in his brother, paternal grandmother, sister, son, and son; Pancreatic cancer (age of onset: 33) in his paternal uncle; Stroke (age of onset: 72) in his father.    SOCIAL HISTORY: He reports that he has quit smoking. His smoking use included cigarettes. He quit smokeless tobacco use about 11 years ago.  His  smokeless tobacco use included snuff. He reports current alcohol use. He reports that he does not use drugs.    PHYSICAL EXAMINATION    Visit Vitals  BP 132/90 (BP Site: Left arm, Patient Position: Sitting, Cuff Size: Large)   Pulse 72   Ht 1.803 m (5\' 11" )   Wt 121.1 kg (267 lb)   BMI 37.24 kg/m       GENERAL: Patient is in no acute distress   HEENT: No scleral icterus or conjunctival pallor, moist mucous membranes   NECK: No jugular venous distention or thyromegaly, normal carotid upstrokes without bruits   CARDIAC: Regular rate and rhythm, with normal S1 and S2, and no murmurs, rubs, or gallops   CHEST: Clear to auscultation bilaterally, normal respiratory effort  ABDOMEN: Soft, nontender, non-distended, normoactive bowel sounds   EXTREMITIES: No clubbing, cyanosis, or edema, 2+ DP  SKIN: No rash or jaundice   NEUROLOGIC: Alert and oriented to time, place and person, normal mood and affect   MUSCULOSKELETAL: Normal muscle strength and tone.      ECG: Sinus rhythm with no ST-T changes      LABS:   Lab Results   Component Value Date    WBC 5.47 11/29/2019    HGB 12.8 11/29/2019    HCT 37.3 (L) 11/29/2019    PLT 304 11/29/2019  Lab Results   Component Value Date    GLU 95 11/29/2019    BUN 18.0 11/29/2019    CREAT 1.2 11/29/2019    NA 139 11/29/2019    K 4.6 11/29/2019    CL 105 11/29/2019    CO2 25 11/29/2019    AST 36 (H) 11/29/2019    ALT 69 (H) 11/29/2019     Lab Results   Component Value Date    MG 2.2 08/10/2017    TSH 2.72 08/08/2019    BNP 15.9 08/10/2017     Lab Results   Component Value Date    CHOL 221 (H) 11/26/2019    TRIG 176 (H) 11/26/2019    HDL 30 (L) 11/26/2019    LDL 156 (H) 11/26/2019           Most recent echo and nuclear study reviewed.      IMPRESSION:   Guy Martinez is a 49 y.o. male with the following problems:    1. 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  2. Hypertension-well controlled.  3. Hyperlipidemia-not on a  statin.  4. Family history of coronary disease-father had his first event in his 80s.      RECOMMENDATIONS:    1.  Updated calcium score to compare to see if this will help him decide to be on aspirin or statin.  I did recommend those either way but he would like some more information first.  2.  Treadmill stress test for ischemic eval.                                                     Orders Placed This Encounter   Procedures   . CT Coronary Calcium Score   . Exercise Stress Test (Office Treadmill)   . OFFICE VISIT 30 MIN ([facility])       No orders of the defined types were placed in this encounter.      SIGNED:    Otis Dials, MD          This note was generated by the Dragon speech recognition and may contain errors or omissions not intended by the user. Grammatical errors, random word insertions, deletions, pronoun errors, and incomplete sentences are occasional consequences of this technology due to software limitations. Not all errors are caught or corrected. If there are questions or concerns about the content of this note or information contained within the body of this dictation, they should be addressed directly with the author for clarification.

## 2019-12-25 ENCOUNTER — Encounter (INDEPENDENT_AMBULATORY_CARE_PROVIDER_SITE_OTHER): Payer: Self-pay

## 2019-12-31 ENCOUNTER — Telehealth: Payer: Self-pay | Admitting: Internal Medicine

## 2019-12-31 ENCOUNTER — Encounter: Payer: Self-pay | Admitting: Internal Medicine

## 2019-12-31 ENCOUNTER — Emergency Department
Admission: EM | Admit: 2019-12-31 | Discharge: 2019-12-31 | Disposition: A | Discharge status: Home / Self Care | Payer: Commercial Managed Care - POS | Attending: Emergency Medicine | Admitting: Emergency Medicine

## 2019-12-31 ENCOUNTER — Ambulatory Visit: Payer: Commercial Managed Care - POS | Admitting: Internal Medicine

## 2019-12-31 ENCOUNTER — Emergency Department: Payer: Commercial Managed Care - POS

## 2019-12-31 DIAGNOSIS — W458XXA Other foreign body or object entering through skin, initial encounter: Secondary | ICD-10-CM | POA: Insufficient documentation

## 2019-12-31 DIAGNOSIS — M79672 Pain in left foot: Secondary | ICD-10-CM

## 2019-12-31 DIAGNOSIS — S90852A Superficial foreign body, left foot, initial encounter: Secondary | ICD-10-CM | POA: Insufficient documentation

## 2019-12-31 MED ORDER — CEPHALEXIN 500 MG PO CAPS
500.00 mg | ORAL_CAPSULE | Freq: Four times a day (QID) | ORAL | 0 refills | Status: AC
Start: 2019-12-31 — End: 2020-01-07

## 2019-12-31 NOTE — Telephone Encounter (Signed)
Patient called today discussing discomfort of his foot [place].  Offered office visit but unfortunately we currently do not have any saline to flush the wound.  Advised to proceed to his nearest urgent care or ER so the wound can be evaluated.  Last tetanus shot was February 2021.  Also discussed the higher purulence of the delta variant.  Advised to continue to use caution along with distancing and mass.  Advised to monitor for any symptoms of sinus or head cold that may precede a positive infection.

## 2019-12-31 NOTE — Discharge Instructions (Signed)
Please rest and avoid aggravating activities. Please follow-up. Return to the ER for any concerns.     Foreign Body, SubQ, Removed    You have been seen for a laceration (cut) with foreign body removal.    Foreign bodies are materials that are not natural to the body. They contaminate wounds and are a common complication of wounds. Some common foreign bodies are gravel, glass fragments and bullets. Clothing material and wood fragments are other common foreign bodies.    Fortunately, your foreign body was identified and completely removed.    Keep the wound clean and dry for the next 24 hours. Avoid too much moisture. You can wash the wound gently with soap and water. Afterward, put on a dry bandage.    DO NOT let your wound soak in water. For example, avoid doing dishes or swimming. You can shower. However, be careful not to rub your stitches too hard (do not be too abrasive). Let the wound dry before you put on another bandage.    Take off old dressings every day. Then put on a clean, dry dressing.   If the dressing sticks to the wound, use water to get it a little wet. This lets it come off more easily.   To help take off a scab, cleanse the area with a half hydrogen peroxide/half water mix.   Let the area dry fully.   Unless your doctor says not to, you can put a thin layer of antibiotic ointment over the wound. You can buy such ointments over the counter (without prescription). These include Polysporin, Bacitracin or Neosporin. Neosporin can sometimes irritate the skin. If this happens, stop using it and try another topical (surface) antibiotic.   Put a clean, dry bandage over the wound if necessary to protect the wound.    Keep the injured area elevated for the next 24 hours. This is to keep down the swelling and pain. You may also want to put ice on the area. By applying ice to the affected area, swelling and pain can be reduced. Put some ice cubes in a re-sealable (Ziploc) bag. Add some  water. Put a thin washcloth between the bag and the skin. Apply the ice bag to the area for at least 20 minutes. NEVER APPLY ICE DIRECTLY TO THE SKIN OR WOUND.    If you had a local anesthetic, it will wear off in about 2 hours. Until then, you must be careful not to hurt yourself because you have less feeling in the area.    At this time, the medical staff decided your wound should not be sutured (stitched). This is because the risk of infection is too high.    YOU SHOULD SEEK MEDICAL ATTENTION IMMEDIATELY, EITHER HERE OR AT THE NEAREST EMERGENCY DEPARTMENT, IF ANY OF THE FOLLOWING OCCURS:   Redness of the wound.   The pain gets worse.   Fever (temperature higher than 100.25F / 38C).   Pus drains from the wound.   Swelling around the wound site.

## 2019-12-31 NOTE — ED Provider Notes (Signed)
Nursing (triage) note reviewed for the following pertinent information:  Pt feels like he has a piece of glass stuck in left foot      History     Chief Complaint   Patient presents with   . Foot Injury     20 YOM presents to the ED with concern he has a piece of glass to left foot. [place] he believes stepped on a piece of broken glass.  Patient reports he continues to have some pain to a specific area in the bottom of his foot.  Patient reports he is unable to feel any glass and does not see where the glass may have gone in. Pt  rates the pain at 4 out of 10 with no radiation.  No over-the-counter medicines.  No fever or chills.  No drainage from the area.  Patient reports he was seen by PMD prior to arrival and sent to the ER for further evaluation.    The history is provided by the patient. No language interpreter was used.   Foot Injury  Location:  Foot  Lower extremity injury: stepped on a piece of glass.    Foot location:  Dorsum of L foot  Pain details:     Quality:  Sharp    Radiates to:  Does not radiate    Severity:  Mild    Onset quality:  Gradual    Timing:  Constant    Progression:  Unchanged  Chronicity:  New  Dislocation: no    Foreign body present:  Glass  Tetanus status:  Up to date (07/2019)  Relieved by:  None tried  Exacerbated by: palpation.  Ineffective treatments:  None tried  Associated symptoms: no back pain, no decreased ROM, no fatigue, no fever, no neck pain, no numbness and no swelling    Risk factors: obesity         Past Medical History:   Diagnosis Date   . H/O Coronary CT Angiogram 10/2010   . H/O echocardiogram 10/2014   . H/O stress echocardiogram 02/2009    normal   . H/O treadmill stress test 01/2008, 05/2010, 11/2015, 12/12/15   . Hyperlipidemia    . Hypertension    . Obesity    . Seasonal allergic rhinitis     Seasonal - Ragweed       Past Surgical History:   Procedure Laterality Date   . 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 Diverticulitis Mother    . Cancer Father 75        Prostate, Bladder   . Heart attack Father 45        4 MI's   . Stroke Father 10        2 strokes   . Hypertension Father    . Diabetes Father    . Hyperlipidemia Father    . Heart disease Father    . Myocardial Infarction Father    . No known problems Sister    . No known problems Brother    . No known problems Son    . No known problems Paternal Grandmother    . COPD Paternal Grandfather    . Heart attack Paternal Grandfather    . Diabetes Paternal Grandfather    . Appendicitis Son    . Asthma Son    .  No known problems Son    . Pancreatic cancer Paternal Uncle 81   . Heart disease Paternal Grandparent    . Diabetes Paternal Grandparent    . Emphysema Paternal Grandparent         pulmonary       Social - lives with family   Social History     Tobacco Use   . Smoking status: Former Smoker     Types: Cigarettes   . Smokeless tobacco: Former Neurosurgeon     Types: Snuff     Quit date: 2010   . Tobacco comment: Social use. 5-7 cig/week   Vaping Use   . Vaping Use: Never used   Substance Use Topics   . Alcohol use: Yes     Comment: regularly, 15-20 per week   . Drug use: Never       .     No Known Allergies    Home Medications     Med List Status: In Progress Set By: Marylynn Pearson, LPN at 09/81/1914  1:12 PM                Cholecalciferol (Vitamin D) 50 MCG (2000 UT) Cap     Take 50 mcg by mouth daily     lisinopril (ZESTRIL) 40 MG tablet     Take 1 tablet (40 mg total) by mouth daily           Review of Systems   Constitutional: Negative for chills, fatigue and fever.   HENT: Negative for congestion and rhinorrhea.    Eyes: Negative for discharge and redness.   Musculoskeletal: Positive for arthralgias. Negative for back pain, gait problem, joint swelling, myalgias, neck pain and neck stiffness.   Skin: Negative for color change, pallor, rash and wound.   Allergic/Immunologic: Negative for immunocompromised state.    Neurological: Negative for dizziness, syncope, weakness, numbness and headaches.   Hematological: Does not bruise/bleed easily.   Psychiatric/Behavioral: Negative for self-injury. The patient is not nervous/anxious.        Physical Exam    BP: (!) 157/94, Heart Rate: 75, Temp: 97.8 F (36.6 C), Resp Rate: 18, SpO2: 97 %, Weight: 127.3 kg    Physical Exam  Vitals and nursing note reviewed.   Constitutional:       General: He is not in acute distress.     Appearance: He is well-developed. He is not diaphoretic.      Comments: Pt resting comfortably in NAD   HENT:      Head: Normocephalic and atraumatic.      Right Ear: External ear normal.      Left Ear: External ear normal.      Nose: Nose normal.   Eyes:      General: No scleral icterus.        Right eye: No discharge.         Left eye: No discharge.      Conjunctiva/sclera: Conjunctivae normal.      Pupils: Pupils are equal, round, and reactive to light.   Neck:      Vascular: No JVD.      Trachea: No tracheal deviation.   Pulmonary:      Effort: Pulmonary effort is normal. No respiratory distress.   Musculoskeletal:         General: Tenderness present. No swelling. Normal range of motion.      Cervical back: Normal range of motion and neck supple.      Comments: Left plantar  aspect of foot with ttp, no edema, no erythema,  No palpable foreign body. No induration or drainage.    Skin:     General: Skin is warm and dry.      Coloration: Skin is not pale.      Findings: No erythema or rash.   Neurological:      Mental Status: He is alert and oriented to person, place, and time.   Psychiatric:         Mood and Affect: Mood normal.         Behavior: Behavior normal.         Thought Content: Thought content normal.         Judgment: Judgment normal.           MDM and ED Course     ED Medication Orders (From admission, onward)    None             MDM  Number of Diagnoses or Management Options  Foreign body in left foot, initial encounter  Left foot pain  Diagnosis  management comments: I, Langley Gauss, PA-C, have been the primary provider for Guy Martinez during this Emergency Dept visit. The attending signature signifies review and agreement of the history, physical exam, evaluation, clinical impression and plan except as noted.   I have reviewed the nursing notes, including Past medical and surgical,Family and Social History     Oxygen Saturation by Pulse Oximetry  is 95%-100% - normal, no interventions needed     DDX: foot contusion, foot pain, foreign body, retained foreign body     Xray reviewed by me - no foreign body seen     Dr. Bufford Buttner, was able to remove a small piece of glass.     Multiple reassessments of the patient. Pt resting comfortably in NAD.  Discussed with patient need for rest, avoid aggravating activities and follow-up. Return to the ER for any concerns. Pt voices understanding. No questions.          Amount and/or Complexity of Data Reviewed  Tests in the radiology section of CPT: ordered and reviewed  Discuss the patient with other providers: yes  Independent visualization of images, tracings, or specimens: yes    Risk of Complications, Morbidity, and/or Mortality  Presenting problems: moderate  Management options: moderate    Patient Progress  Patient progress: stable                   Procedures  Radiology Results (24 Hour)     Procedure Component Value Units Date/Time    Foot Left AP Lateral and Oblique [[id number]] Collected: 12/31/19 1358    Order Status: Completed Updated: 12/31/19 1402    Narrative:      HISTORY: Evaluate for foreign body (glass).    COMPARISON: None.    FINDINGS:  Frontal, lateral, and oblique views of the left foot are provided.    No acute fracture is identified. There is mild hallux valgus. The  alignment is otherwise normal. There are mild degenerative changes of  the first metatarsophalangeal joint with small osteophytes. An Achilles  enthesophyte is noted. Mild soft tissue deformity is noted along the  plantar forefoot.  No radiopaque foreign body is identified.      Impression:        1.  Mild soft tissue deformity along the plantar forefoot. No radiopaque  foreign body is identified.    2.  Mild hallux valgus and mild degenerative changes of the first  metatarsophalangeal joint.    Carla Drape, MD   12/31/2019 2:00 PM          Clinical Impression & Disposition     Clinical Impression  Final diagnoses:   Foreign body in left foot, initial encounter   Left foot pain        ED Disposition     ED Disposition Condition Date/Time Comment    Discharge  Mon Dec 31, 2019  2:24 PM Guy Martinez discharge to home/self care.    Condition at disposition: Stable           Discharge Medication List as of 12/31/2019  2:24 PM      START taking these medications    Details   cephalexin (Keflex) 500 MG capsule Take 1 capsule (500 mg total) by mouth 4 (four) times daily for 7 days, Starting Mon 12/31/2019, Until Mon 01/07/2020, E[facility]                       Concaugh-Gruendel, Sebastian Ache, [place]  12/31/19 1530

## 2019-12-31 NOTE — Telephone Encounter (Signed)
Follow-up phone call after ER visit.  Patient did have the wound debrided and a small piece of glass was removed.  Was given a prescription for Keflex which is appropriate.  Was recommended to consider going to plastics for further wound debridement.  Patient will monitor for now and send me a picture to see if further steps are needed

## 2020-01-03 ENCOUNTER — Ambulatory Visit (INDEPENDENT_AMBULATORY_CARE_PROVIDER_SITE_OTHER): Payer: Commercial Managed Care - POS | Admitting: Cardiovascular Disease

## 2020-01-03 DIAGNOSIS — I10 Essential (primary) hypertension: Secondary | ICD-10-CM

## 2020-01-03 DIAGNOSIS — E7800 Pure hypercholesterolemia, unspecified: Secondary | ICD-10-CM

## 2020-01-03 DIAGNOSIS — Z8249 Family history of ischemic heart disease and other diseases of the circulatory system: Secondary | ICD-10-CM

## 2020-01-03 NOTE — Procedures (Signed)
EXERCISE STRESS TEST    [facility] OFFICE -CARDIOLOGY  [address]  [place] [address]  Dept: [phone]  Dept Fax: [phone]     Patient: Guy Martinez  Sex: Male   DOB: 10-Jul-1970 (49 y.o.)  MRN:  [id number]     Test Date:  01/03/2020       Interpretation Date: 01/03/2020    Referring Physician: Sarina Ill, MD     CLINICIAN: DFreudenthal  SUPERVISING PROVIDER: Redmond School, [place], [facility]    MEDICATIONS:  Patient has a current medication list which includes the following prescription(s): cephalexin - Take 1 capsule (500 mg total) by mouth 4 (four) times daily for 7 days, vitamin d - Take 50 mcg by mouth daily, and lisinopril - Take 1 tablet (40 mg total) by mouth daily.    INDICATION: Coronary artery disease (Family history)    GRADED ECG EXERCISE TEST:    ----- Protocol and Exercise Time -----   Protocol Bruce   Exercise Time (min) 08:51   ----- Heart Rate -----   Resting HR 72 bpm   Maximum HR 170 bpm   METS 9.75 METS   Percent Maximum Predicted HR 99 %    -----Blood Pressure -----   Resting BP 142/71mmHg   Maximum BP 232/84 mmHg     Patient Symptoms: none    Reason for end: Fatigue    Resting ECG: Normal sinus rhythm  ST Changes: No remarkable EKG changes  Stress ECG:      Arrhythmias: None    INTERPRETATION:  1. Hypertensive blood pressure response to exercise.  2. Exercise capacity average for age and gender.  3. No chest pain or ischemic ECG changes with exercise.    CONCLUSIONS:  Normal maximal exercise treadmill test with no clinical or ECG evidence of ischemia.  Hypertensive blood pressure response to exercise.      Results and recommendations discussed with the patient.      INTERPRETED BY: Keitha Butte, MD

## 2020-01-07 ENCOUNTER — Other Ambulatory Visit: Payer: Self-pay | Admitting: Cardiology

## 2020-01-08 ENCOUNTER — Other Ambulatory Visit (INDEPENDENT_AMBULATORY_CARE_PROVIDER_SITE_OTHER): Payer: Self-pay | Admitting: Cardiology

## 2020-01-11 ENCOUNTER — Telehealth (INDEPENDENT_AMBULATORY_CARE_PROVIDER_SITE_OTHER): Payer: Self-pay | Admitting: Cardiology

## 2020-01-11 NOTE — Telephone Encounter (Signed)
Attempted to call patient to give him the results of the coronary calcium score.  Would like him to begin a statin.

## 2020-02-03 ENCOUNTER — Other Ambulatory Visit (INDEPENDENT_AMBULATORY_CARE_PROVIDER_SITE_OTHER): Payer: Self-pay | Admitting: Cardiology

## 2020-02-03 DIAGNOSIS — I10 Essential (primary) hypertension: Secondary | ICD-10-CM

## 2020-02-06 ENCOUNTER — Other Ambulatory Visit (INDEPENDENT_AMBULATORY_CARE_PROVIDER_SITE_OTHER): Payer: Self-pay

## 2020-02-06 DIAGNOSIS — I10 Essential (primary) hypertension: Secondary | ICD-10-CM

## 2020-02-06 MED ORDER — LISINOPRIL 40 MG PO TABS
40.0000 mg | ORAL_TABLET | Freq: Every day | ORAL | 0 refills | Status: DC
Start: 2020-02-06 — End: 2020-03-07

## 2020-03-07 ENCOUNTER — Other Ambulatory Visit (INDEPENDENT_AMBULATORY_CARE_PROVIDER_SITE_OTHER): Payer: Self-pay | Admitting: Cardiology

## 2020-03-07 DIAGNOSIS — I10 Essential (primary) hypertension: Secondary | ICD-10-CM

## 2020-03-07 MED ORDER — LISINOPRIL 40 MG PO TABS
40.0000 mg | ORAL_TABLET | Freq: Every day | ORAL | 2 refills | Status: DC
Start: 2020-03-07 — End: 2021-04-13

## 2020-03-14 ENCOUNTER — Ambulatory Visit (INDEPENDENT_AMBULATORY_CARE_PROVIDER_SITE_OTHER): Payer: Commercial Managed Care - POS | Admitting: Cardiology

## 2020-03-18 ENCOUNTER — Telehealth: Payer: Self-pay | Admitting: Internal Medicine

## 2020-03-18 NOTE — Telephone Encounter (Signed)
COVID-19 Questionnaire:         Fever: no    Cough: no    Chills: no    Short of breath: no    Sore Throat: no    New Headache: no    Loss of taste or smell: no    Body aches not from physical activity: no    Close contact with a COVID-19 person (Mask and 6 feet distancing does not count)?: no    Recently been tested for COVID-19?: no    Ever been diagnosed with COVID-19?: no    Live in a group living residence (such as an assisted-living facility, nursing home, shelter, or dormitory)?: no    Visited a group living residence (assisted-living facility, nursing home, shelter, or dormitory)? Yes he visited a dormitory 2 weeks ago    Traveled outside [place] 2 weeks ago    Any social gatherings without masking or distancing? yes    Work or Agricultural consultant in a healthcare facility?: no         YES to any of these will be telemedicine visit unless it is an emergency. Please advise clinical team for in person visits if any of these are answered yes.      Assessment/Plan:     Patient advised to call from car upon arrival. Instructed to wear face mask if patient does not have one, one will be issued to them. Advised temperature will be taken in lobby of building. Instructed not to bring companion if not medically necessary and do not wear gloves due to cross contamination. Hand sanitizer and soap are readily available for use in office. Patient verbalized understanding.

## 2020-03-18 NOTE — Telephone Encounter (Signed)
Noted. Thank You.

## 2020-03-19 ENCOUNTER — Encounter: Payer: Self-pay | Admitting: Internal Medicine

## 2020-03-19 ENCOUNTER — Ambulatory Visit (INDEPENDENT_AMBULATORY_CARE_PROVIDER_SITE_OTHER): Payer: Commercial Managed Care - POS | Admitting: Internal Medicine

## 2020-03-19 VITALS — BP 147/100 | HR 67 | Temp 98.3°F | Resp 18 | Wt 277.0 lb

## 2020-03-19 DIAGNOSIS — E663 Overweight: Secondary | ICD-10-CM | POA: Insufficient documentation

## 2020-03-19 DIAGNOSIS — E7800 Pure hypercholesterolemia, unspecified: Secondary | ICD-10-CM

## 2020-03-19 DIAGNOSIS — L570 Actinic keratosis: Secondary | ICD-10-CM

## 2020-03-19 DIAGNOSIS — I10 Essential (primary) hypertension: Secondary | ICD-10-CM

## 2020-03-19 DIAGNOSIS — L723 Sebaceous cyst: Secondary | ICD-10-CM | POA: Insufficient documentation

## 2020-03-19 DIAGNOSIS — Z6841 Body Mass Index (BMI) 40.0 and over, adult: Secondary | ICD-10-CM

## 2020-03-19 HISTORY — DX: Actinic keratosis: L57.0

## 2020-03-19 HISTORY — DX: Sebaceous cyst: L72.3

## 2020-03-19 NOTE — Progress Notes (Signed)
Subjective:      Patient ID: Guy Martinez is a 49 y.o. male     Chief Complaint   Patient presents with   . Follow-up     Cardiology about starting a statin and wt loss   . Shortness of Breath     Noticed more latley with movement, denies CP or other sx        Patient here for follow-up after cardiac evaluation.  Patient with unremarkable exercise stress test and a CAC score of 126.  Medication to treat his cholesterol was recommended at that time the patient has not started his Lipitor.    Patient also noticing some shortness of breath with activities such as climbing stairs but denies any wheezing, chest pain, cough, dizziness, near loss of consciousness, or shortness of breath at rest.    Patient has been struggling with weight loss and understands that his diet and lifestyle do require significant improvement.  Follows no specific diet at this time and not very well-balanced overall and has no current exercise regimen       The following sections were reviewed this encounter by the provider:        Review of Systems   Constitutional: Negative for fever.   Respiratory: Positive for shortness of breath. Negative for cough, choking and wheezing.    Cardiovascular: Negative for chest pain.   Neurological: Negative for dizziness, weakness and light-headedness.          BP (!) 147/100 (BP Site: Left arm, Patient Position: Sitting, Cuff Size: Large)   Pulse 67   Temp 98.3 F (36.8 C) (Oral)   Resp 18   Wt 125.6 kg (277 lb)   SpO2 98%   BMI 49.07 kg/m     Objective:     Physical Exam  Vitals reviewed.   Constitutional:       General: He is not in acute distress.     Appearance: Normal appearance. He is normal weight. He is not ill-appearing, toxic-appearing or diaphoretic.   HENT:      Head: Normocephalic and atraumatic.   Cardiovascular:      Rate and Rhythm: Normal rate and regular rhythm.      Pulses: Normal pulses.      Heart sounds: Normal heart sounds. No murmur heard.   No friction rub. No gallop.     Pulmonary:      Effort: Pulmonary effort is normal. No respiratory distress.      Breath sounds: Normal breath sounds. No stridor. No wheezing, rhonchi or rales.   Chest:      Chest wall: No tenderness.   Neurological:      Mental Status: He is alert.          Assessment:     1. Pure hypercholesterolemia  - Comprehensive metabolic panel; Future    2. Morbid obesity    3. Body mass index 45.0-49.9, adult    4. Primary hypertension        Plan:     Hypertension  -Patient currently on lisinopril 40 mg a day.  Blood pressure is elevated in the office today and elevated in the 130s over 90s at his last cardiology visit.  -Asked patient to check a log at home to see if further titration of his medication is needed.    Hyperlipidemia  -ASCVD score of 10.0.  Strongly recommended patient consider starting a statin.  -Will check CMP 3 weeks after initiating statin dose and advised patient to notify  us if he has any  muscle aches.  -Strongly advised patient to consider aggressive lifestyle changes as this will a significantly impact his weight, blood pressure, and cholesterol    Lab work follow-up in 3 weeks to check CMP.  Lab work follow-up in 3 to 4 months to recheck lipid panel and CMP  Office follow-up in 6 months for physical in 07/2020    Sarina Ill, MD

## 2020-04-10 ENCOUNTER — Ambulatory Visit (INDEPENDENT_AMBULATORY_CARE_PROVIDER_SITE_OTHER): Payer: Commercial Managed Care - POS

## 2020-04-10 ENCOUNTER — Other Ambulatory Visit: Payer: Commercial Managed Care - POS

## 2020-04-10 DIAGNOSIS — E7800 Pure hypercholesterolemia, unspecified: Secondary | ICD-10-CM

## 2020-04-10 LAB — COMPREHENSIVE METABOLIC PANEL
ALT: 58 U/L — ABNORMAL HIGH (ref 0–55)
AST (SGOT): 29 U/L (ref 5–34)
Albumin/Globulin Ratio: 1.7 (ref 0.9–2.2)
Albumin: 4.4 g/dL (ref 3.5–5.0)
Alkaline Phosphatase: 98 U/L (ref 37–117)
Anion Gap: 9 (ref 5.0–15.0)
BUN: 17 mg/dL (ref 9.0–28.0)
Bilirubin, Total: 0.8 mg/dL (ref 0.2–1.2)
CO2: 28 mEq/L (ref 21–29)
Calcium: 9.2 mg/dL (ref 8.5–10.5)
Chloride: 106 mEq/L (ref 100–111)
Creatinine: 1.2 mg/dL (ref 0.5–1.5)
Globulin: 2.6 g/dL (ref 2.0–3.7)
Glucose: 96 mg/dL (ref 70–100)
Potassium: 5.2 mEq/L — ABNORMAL HIGH (ref 3.5–5.1)
Protein, Total: 7 g/dL (ref 6.0–8.3)
Sodium: 143 mEq/L (ref 136–145)

## 2020-04-10 LAB — HEMOLYSIS INDEX: Hemolysis Index: 6 (ref 0–24)

## 2020-04-10 LAB — GFR: EGFR: >60

## 2020-04-10 NOTE — Progress Notes (Signed)
Blood draw:  Using aseptic technique, blood was drawn from the left antecubital without any complication. Attempt # 1. Patient tolerated procedure well. All labs will be sent to [facility] refrigerated.    -1 SSTs     Patient left in good condition today.

## 2020-04-11 ENCOUNTER — Encounter: Payer: Self-pay | Admitting: Internal Medicine

## 2020-05-06 ENCOUNTER — Other Ambulatory Visit: Payer: Self-pay | Admitting: Internal Medicine

## 2020-05-06 ENCOUNTER — Encounter: Payer: Self-pay | Admitting: Internal Medicine

## 2020-05-06 DIAGNOSIS — E782 Mixed hyperlipidemia: Secondary | ICD-10-CM

## 2020-05-06 DIAGNOSIS — I10 Essential (primary) hypertension: Secondary | ICD-10-CM

## 2020-05-06 DIAGNOSIS — Z0000 Encounter for general adult medical examination without abnormal findings: Secondary | ICD-10-CM

## 2020-05-06 NOTE — Telephone Encounter (Signed)
Refill of statin. Normal CMP 11/11. Needs repeat CMP and lipid panel in 3 months which can be done at physical due  Reminded pt to send in blood pressure log.

## 2020-05-19 DIAGNOSIS — U071 COVID-19: Secondary | ICD-10-CM | POA: Insufficient documentation

## 2020-05-20 ENCOUNTER — Encounter: Payer: Self-pay | Admitting: Internal Medicine

## 2020-05-20 ENCOUNTER — Other Ambulatory Visit: Payer: Self-pay | Admitting: Family Medicine

## 2020-05-20 DIAGNOSIS — U071 COVID-19: Secondary | ICD-10-CM

## 2020-05-20 DIAGNOSIS — R059 Cough, unspecified: Secondary | ICD-10-CM

## 2020-05-20 MED ORDER — HYDROCOD POLST-CPM POLST ER 10-8 MG/5ML PO SUER
5.0000 mL | Freq: Every evening | ORAL | 0 refills | Status: DC | PRN
Start: 2020-05-20 — End: 2020-05-21

## 2020-05-20 MED ORDER — BENZONATATE 200 MG PO CAPS
200.0000 mg | ORAL_CAPSULE | Freq: Three times a day (TID) | ORAL | 0 refills | Status: DC | PRN
Start: 2020-05-20 — End: 2020-05-20

## 2020-05-20 MED ORDER — BENZONATATE 200 MG PO CAPS
200.0000 mg | ORAL_CAPSULE | Freq: Three times a day (TID) | ORAL | 0 refills | Status: AC | PRN
Start: 2020-05-20 — End: 2020-05-30

## 2020-05-20 MED ORDER — HYDROCOD POLST-CPM POLST ER 10-8 MG/5ML PO SUER
5.0000 mL | Freq: Every evening | ORAL | 0 refills | Status: DC | PRN
Start: 2020-05-20 — End: 2020-05-20

## 2020-05-20 NOTE — Progress Notes (Signed)
Sent to [facility].  Please inform pt to quarantine for 10 days after positive test.

## 2020-05-20 NOTE — Progress Notes (Signed)
Telephone call to Guy Martinez to follow up on positive Covid test. Patient reports that he started experiencing symptoms including a cough, congestion, and sore throat last night. He just now had his first feeling of "a little bit of a chill." He took two Tylenol to help his sore throat.    Patient was at a meeting in the office last Tuesday where he might have been exposed. He is currently isolating from his family in [place] and was asking about options for his wife to get tested. Discussed [facility] and [facility], will send patient email with information for both to pass on to his wife.     Patient did ask if there is anything he could take for the cough. Informed him I would discuss with Dr Corrin Parker. If a prescription is sent it should go to [id number] in [place], [place].

## 2020-05-21 MED ORDER — GUAIFENESIN-CODEINE 100-10 MG/5ML PO SYRP
5.0000 mL | ORAL_SOLUTION | Freq: Three times a day (TID) | ORAL | 0 refills | Status: AC | PRN
Start: 2020-05-21 — End: 2020-05-31

## 2020-05-21 NOTE — Progress Notes (Signed)
Received call from Lorenda Hatchet that [facility] does not have Tussionex in stock.     Called to [facility] in [place], [place] per patient request. They do not have Tussionex but do have Robitussin with Codeine.

## 2020-05-21 NOTE — Addendum Note (Signed)
Addended by: Farris Has on: 05/21/2020 03:23 PM     Modules accepted: Orders

## 2020-05-22 ENCOUNTER — Telehealth: Payer: Self-pay

## 2020-05-22 NOTE — Telephone Encounter (Signed)
E-mail received from the client:    From: Guy [email or web address]  Date: May 22, 2020 at 8:48:11 AM EST    The robo didn't really help.  I did set up some humidifiers and I think that those helped. My chest is congested and the phlegm is turning green, when it comes out. I am starting to take muccinex, not the dm version.     >>>>>>>>>>>>>>>>>>>>>>>>>>>>>>>>>>>>>>>>>>>>>>>>    Called. Spoke with the client.     Symptoms: Client reports that he does not have "a lot of symptoms." However, his coughing is "brutal at night" & it has been waking him up. In addition, he is having some chest congestion with occasional greenish phlegm production when he coughs. Client reports post nasal drip, some nasal congestion, & throat irritation from his coughing.     Denies: Client denies any fevers, chills, body aches, shortness of breathe, difficulty breathing, ear pain / pressure, or sinus pain or pressure.     Question: Client is concerned about a possible bacterial infection, and is wondering if he needs any antibiotics.     Medications: Client reports he is taking Tylenol 650 mg every 6-8 hours along with the Robitussin with Codeine at night, the Benzonatate every 8 hrs as prescribed, & just started taking Mucinex (guaifenesin).     Recommendations: Encouraged the client to increase hydration, drink warm lemon tea with honey, try saline nasal spray for the nasal congestion, & cough drops for his sore throat. In addition, informed the client that he can try Benadryl or ZzzQuil for sleep. Instructed the client to be mindful of duplicate ingredients such as Tylenol or Benadryl (diphenhydramine) & to monitor doses.     Will speak with provider regarding client's question.

## 2020-05-22 NOTE — Telephone Encounter (Signed)
Agree with recommendation below.  Hold abx unless symptom advances/changes.

## 2020-05-22 NOTE — Telephone Encounter (Signed)
Called. Spoke with the client and notified him of Dr. Gwynneth Macleod response and recommendations. Instructed the client to call the office to speak with the on-call provider over the weekend if his symptoms worsen or he develops any fevers or respiratory issues.    Client verbalized understanding.

## 2020-05-28 ENCOUNTER — Telehealth: Payer: Self-pay | Admitting: Internal Medicine

## 2020-05-28 DIAGNOSIS — N529 Male erectile dysfunction, unspecified: Secondary | ICD-10-CM

## 2020-05-28 MED ORDER — TADALAFIL 5 MG PO TABS
5.0000 mg | ORAL_TABLET | Freq: Every day | ORAL | 0 refills | Status: DC | PRN
Start: 2020-05-28 — End: 2021-03-30

## 2020-05-28 NOTE — Telephone Encounter (Signed)
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  The rest of his family is also tested positive and suffering from mild symptoms.  Was also calling to talk about some erectile dysfunction.  Patient reports some difficulty maintaining erections which preceded COVID.  Has been more intimate with his wife recently and has had issues maintaining an erection.  Has never been on medication in the past and would like to consider at this time.  Given Cialis 5 mg as needed.  Advised patient to monitor for lightheadedness, weakness, chest pains, or dizziness.  If 5 mg is ineffective after several tries can consider increasing dose to 10 mg.

## 2020-07-18 ENCOUNTER — Ambulatory Visit (INDEPENDENT_AMBULATORY_CARE_PROVIDER_SITE_OTHER): Payer: No Typology Code available for payment source | Admitting: Cardiology

## 2020-07-18 ENCOUNTER — Encounter (INDEPENDENT_AMBULATORY_CARE_PROVIDER_SITE_OTHER): Payer: Self-pay | Admitting: Cardiology

## 2020-07-18 VITALS — BP 136/84 | HR 72 | Ht 71.0 in | Wt 286.0 lb

## 2020-07-18 DIAGNOSIS — Z8249 Family history of ischemic heart disease and other diseases of the circulatory system: Secondary | ICD-10-CM

## 2020-07-18 DIAGNOSIS — E7800 Pure hypercholesterolemia, unspecified: Secondary | ICD-10-CM

## 2020-07-18 DIAGNOSIS — I10 Essential (primary) hypertension: Secondary | ICD-10-CM

## 2020-07-18 NOTE — Progress Notes (Signed)
[facility] OFFICE PROGRESS NOTE    HRT [facility] OFFICE -CARDIOLOGY  [address]  [place] [address]  Dept: [phone]  Dept Fax: [phone]       Patient Name: Guy Martinez, Guy Martinez    Date of Visit:  July 18, 2020  Date of Birth: 11-Sep-1970  AGE: 50 y.o.  Medical Record #: [id number]  Requesting Physician: Sarina Ill, MD      CHIEF COMPLAINT: Hypertension      HISTORY OF PRESENT ILLNESS:    He is a pleasant 50 y.o. male who presents today for a follow-up visit. He has no chest pains or shortness of breath. He has gained about 19 pounds since July 2021. He has not been working out and is trying to work on his diet.    PAST MEDICAL HISTORY: He has a past medical history of H/O Coronary CT Angiogram (10/2010), H/O echocardiogram (10/2014), H/O stress echocardiogram (02/2009), H/O treadmill stress test (01/2008, 05/2010, 11/2015, 12/12/15), Hyperlipidemia, Hypertension, Obesity, and Seasonal allergic rhinitis. 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    ALLERGIES: No Known Allergies    MEDICATIONS:   Current Outpatient Medications   Medication Sig   . atorvastatin (LIPITOR) 10 MG tablet TAKE 1 TABLET BY MOUTH EVERY DAY   . Cholecalciferol (Vitamin D) 50 MCG (2000 UT) Cap Take 50 mcg by mouth daily   . lisinopril (ZESTRIL) 40 MG tablet Take 1 tablet (40 mg total) by mouth daily   . tadalafil (CIALIS) 5 MG tablet Take 1 tablet (5 mg total) by mouth daily as needed for Erectile Dysfunction        FAMILY HISTORY: family history includes Appendicitis in his son; Asthma in his son; COPD in his paternal grandfather; Cancer (age of onset: 6) in his father; Diabetes in his father, paternal grandfather, and paternal grandparent; Diverticulitis in his mother; Emphysema in his paternal grandparent; Heart attack in his paternal grandfather; Heart attack (age of onset: 85) in his father; Heart disease in his father and paternal  grandparent; Hyperlipidemia in his father; Hypertension in his father; Myocardial Infarction in his father; No known problems in his brother, paternal grandmother, sister, son, and son; Pancreatic cancer (age of onset: 39) in his paternal uncle; Stroke (age of onset: 17) in his father.    SOCIAL HISTORY: He reports that he has quit smoking. His smoking use included cigarettes. He quit smokeless tobacco use about 12 years ago.  His smokeless tobacco use included snuff. He reports current alcohol use. He reports that he does not use drugs.    PHYSICAL EXAMINATION    Visit Vitals  BP 136/84 (BP Site: Left arm, Patient Position: Sitting, Cuff Size: Large)   Pulse 72   Ht 1.803 m (5\' 11" )   Wt 129.7 kg (286 lb)   BMI 39.89 kg/m       GENERAL: Patient is in no acute distress   HEENT: No scleral icterus or conjunctival pallor, moist mucous membranes   NECK: No jugular venous distention or thyromegaly, normal carotid upstrokes without bruits   CARDIAC: Regular rate and rhythm, with normal S1 and S2, and no murmurs, rubs, or gallops   CHEST: Clear to auscultation bilaterally, normal respiratory effort  ABDOMEN: Soft, nontender, non-distended, normoactive bowel sounds   EXTREMITIES: No clubbing, cyanosis, or edema, 2+ DP  SKIN: No rash or jaundice   NEUROLOGIC: Alert and oriented to time, place and person, normal mood  and affect   MUSCULOSKELETAL: Normal muscle strength and tone.          LABS:   Lab Results   Component Value Date    WBC 5.47 11/29/2019    HGB 12.8 11/29/2019    HCT 37.3 (L) 11/29/2019    PLT 304 11/29/2019     Lab Results   Component Value Date    GLU 96 04/10/2020    BUN 17.0 04/10/2020    CREAT 1.2 04/10/2020    NA 143 04/10/2020    K 5.2 (H) 04/10/2020    CL 106 04/10/2020    CO2 28 04/10/2020    AST 29 04/10/2020    ALT 58 (H) 04/10/2020     Lab Results   Component Value Date    MG 2.2 08/10/2017    TSH 2.72 08/08/2019    BNP 15.9 08/10/2017     Lab Results   Component Value Date    CHOL 221 (H)  11/26/2019    TRIG 176 (H) 11/26/2019    HDL 30 (L) 11/26/2019    LDL 156 (H) 11/26/2019           Most recent echo and nuclear study reviewed.      IMPRESSION:   Guy Martinez is a 50 y.o. male with the following problems:    1. 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 CAC 126, 92nd percentile 12/2019.  2. Hypertension-well controlled.  3. Hyperlipidemia-not on a statin.  4. Family history of coronary disease-father had his first event in his 11s.  5. Normal ETT in 2021.  6. Overweight.      RECOMMENDATIONS:    1. Reviewed previous calcium score test and discussed lifestyle changes to affect weight. Consideration for intermittent fasting and increase exercise.  2. Follow-up in 6 months to see his progress.                                                   Orders Placed This Encounter   Procedures   . OFFICE VISIT 30 MIN (HRT [place])       No orders of the defined types were placed in this encounter.      SIGNED:    Otis Dials, MD          This note was generated by the Dragon speech recognition and may contain errors or omissions not intended by the user. Grammatical errors, random word insertions, deletions, pronoun errors, and incomplete sentences are occasional consequences of this technology due to software limitations. Not all errors are caught or corrected. If there are questions or concerns about the content of this note or information contained within the body of this dictation, they should be addressed directly with the author for clarification.

## 2020-08-21 ENCOUNTER — Other Ambulatory Visit: Payer: Self-pay

## 2020-08-21 DIAGNOSIS — Z0000 Encounter for general adult medical examination without abnormal findings: Secondary | ICD-10-CM

## 2020-08-21 NOTE — Addendum Note (Signed)
Addended by: Cordie Grice on: 08/21/2020 08:24 AM     Modules accepted: Orders

## 2020-08-21 NOTE — Addendum Note (Signed)
Addended by: Cordie Grice on: 08/21/2020 08:23 AM     Modules accepted: Orders

## 2020-08-22 ENCOUNTER — Other Ambulatory Visit: Payer: No Typology Code available for payment source

## 2020-08-25 ENCOUNTER — Telehealth: Payer: Self-pay | Admitting: Internal Medicine

## 2020-08-25 ENCOUNTER — Other Ambulatory Visit: Payer: No Typology Code available for payment source

## 2020-08-25 NOTE — Telephone Encounter (Signed)
Called patient at 10:05am and left voice mail to see if was on his way or if needed to reschedule. Left voice mail

## 2020-09-01 NOTE — Progress Notes (Signed)
Guy Martinez    Date of Visit: 10/27/2016   Date of Birth: [birth year]  Age: 50 yrs.  Medical  Record Number: [id number]  __  CURRENT DIAGNOSES     1. Obesity, unspecified, E66.9  2. Pure  hypercholesterolemia, unspecified, E78.00  3. Hypertension (essential or benign or malignant), I10  4. 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 Shortness of breath, R06.02  __   ALLERGIES    No Known Drug Allergies  __  MEDICATIONS      1. lisinopril 40 mg tablet, 1 po qd  __  CHIEF COMPLAINT/REASON FOR VISIT  Followup of Hypertension (essential or benign or malignant)   __  HISTORY OF PRESENT ILLNESS  Guy Martinez comes in for clinical followup. He has been following [employer] and is successful in about a 20-pound  weight loss. He feels better and his clothes fit better. He did, however, get on his wife's Pel[employer] machine the other day and became very short of breath. He has not been exercising regularly. We discussed starting a gradual regular exercise regimen.  His blood pressure is still off target and he has not gotten any labs despite multiple recommendations to do so over time. He seems a little bit more motivated on this occasion. I have encouraged him to increase lisinopril to 40 mg daily.   __   PAST HISTORY     Past Medical Illnesses: Hyperlipidemia, Hypertension, Obesity;   Past Cardiac Illnesses: No previous history of cardiac disease.; Infectious Diseases : No previous history of significant infectious diseases.; Surgical Procedures: No previous surgical procedures.;  Trauma History: No previous history of significant trauma.; Cardiology Procedures-Invasive: No previous  interventional or invasive cardiology procedures.; Cardiology Procedures-Noninvasive: Treadmill Stress Test September 2009, January 2012, July 2017, Stress  Echocardiogram -Normal October 2010, Coronary CT Angiogram June 2012, Echocardiogram June 2016, Treadmill Stress Test, 12/12/15; Left Ventricular Ejection Fraction : LVEF of  65% documented via echocardiogram on 11/06/2014; Peripheral Vascular Procedures: Carotid NIVA October 2010   SOCIAL HISTORY    Alcohol Use: drinks regularly and 15-20 drinks per week;  Smoking: never smoked; Never smoker ([id number]); Diet: Regular diet without modifications and Caffeine  use-3-4 per day; Lifestyle: Married and 4 sons; Exercise : Exercises occasionally; Occupation: Copywriter, 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  Height:  72.00"  BMI: 34   Pulse: 64/min. Apical    Respirations: 16/min.       Constitutional: Cooperative, alert and oriented,well developed,  well nourished, in no acute distress. Skin: warm and dry to touch Head : normocephalic, normal male hair pattern Eyes: conjunctivae and lids normal ENT : No pallor or cyanosis, normal dentition Neck: carotid pulses are full and equal bilaterally, no bruits  Chest: clear to auscultation bilaterally, normal respiratory excursion Cardiac: Regular rhythm, S1  normal, S2 normal, No S3 or S4, Apical impulse not displaced, no murmurs, gallops or rubs detected. Abdomen: bowel sounds normoactive  Peripheral Pulses: pulses full and equal in all extremities Extremities/Back: No spinal abnormalities  noted., no edema present Neurological: oriented to time, person and place, affect appropriate   __     Medications added today by the physician:  lisinopril 40 mg tablet, 1 po qd, 90    IMPRESSIONS:  1. Hypertension with labile component but clearly  uncontrolled. Normal response to treadmill    stress test in 2016 but mildly hypertensive on last test on December 12, 2015.   2. 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 He has now lost weight in the   [employer] program.   3. Mild left ventricular hypertrophy on echocardiogram  on November 06, 2014.   4. Normal treadmill stress test in 2017, 2015,  2009, and 2012.   5. Nonobstructive coronary atherosclerosis by coronary CTA in 2012. Calcium score of 23.   6. Hypercholesterolemia untreated with last lipids in 2015.    7. Excess alcohol consumption with elevated triglycerides.   8. Strong family history of ischemic heart disease in his father with associated cardiac anxiety.     RECOMMENDATIONS:  1. Increase lisinopril to 40 mg daily.   2. Continue  participation in [employer].   3. I really pleaded with him to get his lipids and electrolytes checked. We can be in touch   regarding initiation of lipid-lowering therapy if appropriate by telephone.   4. Otherwise return routine clinical  followup in three months. Presuming he will start   lipid-lowering therapy and we could see him following that.     Encarnacion Slates, M.D.     SK/tuarg    EL   ____________________________   Christianne Dolin  ZD:[id number] Education ICD-10: I10 MedlinePlus Connect results for ICD-10 I10  Return Visit 15 MIN 3 months

## 2020-09-01 NOTE — Progress Notes (Signed)
[facility] OFFICE  [address] [place], [place] [address]     Lorenda Hatchet    Date of Visit: 10/23/2014   Date of Birth: [birth year]  Age: 49 yrs.  Medical  Record Number: [id number]  __  CURRENT DIAGNOSES     1. Obesity, unspecified, E66.9  2. Pure  hypercholesterolemia, E78.0  3. Hypertension (essential or benign or malignant), I10  4. Elevated blood-pressure reading, without diagnosis of hypertension, R03.0  5. 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 aspirin 81 mg chewable tablet, 1 po qd  2. lisinopril 10 mg tablet, Take as Directed 1 po daily for 1 week, if no improvement in BP, 2 po daily  __  CHIEF COMPLAINT/REASON FOR  VISIT  Followup of Hypertension (essential or benign or malignant), Followup of Obesity, unspecified and Followup of Pure hypercholesterolemia  __   HISTORY OF PRESENT ILLNESS  Cathan Gearin comes in to reestablish cardiology follow-up. He was last seen in 2012 and has a very strong family history of ischemic heart disease. At that time he had a friend  diet suddenly and therefore pursued cardiovascular testing. He had a CT coronary angiogram which was very reassuring in that it showed mild nonobstructive coronary atherosclerosis with a total calcium score of only 23 although primarily located in the  LAD distribution. He has not developed any interval exertional chest discomfort or dyspnea but his risk factor profile has become much worse. He has gained approximately 23 pounds since he was last seen and his blood pressure remains elevated. He has  not taken any medications despite advice to do so for chronic hypercholesterolemia. We discussed reversing all of this. He is under some stress both in his marriage and in his work. 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 We discussed focusing on 30 minutes of exercise 5 days a week and eliminating and reducing  carbohydrates to achieve better weight loss. Until he is able to change his lifestyle, I have strongly recommended initiation of antihypertensive agents  and have taken the liberty of giving him a prescription for lisinopril today. He was also advised to start daily low dose aspirin. He is going to return for an echocardiogram to assess for left ventricular hypertrophy and clinical follow-up in about three  weeks at which point we would consider treadmill stress testing.    __  PAST HISTORY      Past Medical Illnesses: Hyperlipidemia, Hypertension, Obesity;  Past Cardiac Illnesses: No previous  history of cardiac disease.; Infectious Diseases: No previous history of significant infectious diseases.;  Surgical Procedures: No previous surgical procedures.; Trauma History: No previous history of significant  trauma.; Cardiology Procedures-Invasive: No previous interventional or invasive cardiology procedures.;  Cardiology Procedures-Noninvasive: Treadmill Stress Test September 2009, Stress Echocardiogram -Normal October 2010, treadmill stress test 05/2010, Coronary CT Angiogram June 2012;  Peripheral Vascular Procedures: Carotid NIVA October 2010  ___  FAMILY HISTORY   Father -- Myocardial infarction, CVA, Diabetes mellitus  PaternalGrandparent -- Heart disorder,  Diabetes mellitus, Pulmonary emphysema    SOCIAL HISTORY    Alcohol Use: drinks regularly and 15-20 drinks per week;  Smoking: never smoked; Never smoker ([id number]); Diet: Regular diet without modifications and Caffeine  use-3-4 per day; Lifestyle: Married and 4 sons; Exercise : Exercises occasionally; Occupation: Copywriter, 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  Height:  72"  BMI: 36   Pulse: 71/min. Apical    Respirations: 16/min.       Constitutional: Cooperative, alert and oriented,well  developed,  well nourished, in no acute distress. Skin: warm and dry to touch Head : normocephalic, normal male hair pattern Eyes: conjunctivae and lids unremarkable ENT : No pallor or cyanosis, normal dentition Neck: carotid pulses are full and equal bilaterally, no bruits  Chest: Normal symmetry, normal A-P diameter, normal respiratory effort, clear to auscultation bilaterally Cardiac : Regular rhythm, S1 normal, S2 normal, No S3 or S4, Apical impulse not displaced, no murmurs, gallops or rubs detected. Abdomen: bowel sounds normoactive,  no bruits Peripheral Pulses: pulses full and equal in all extremities Extremities/Back : No spinal abnormalities noted., no edema present Neurological: No gross motor or sensory deficits noted, affect appropriate, oriented to time, person  and place.   __    Medications added today by the physician:  aspirin 81 mg chewable tablet, 1 po qd, 1  lisinopril 10 mg tablet, Take  as Directed 1 po daily for 1 week, if no improvement in BP, 2 po daily, 60      IMPRESSIONS:  1. Strong family history of ischemic heart disease in his father.  2. 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  3. Hypercholesterolemia, untreated.  4. Hypertension, untreated.  5. Normal treadmill stress test 2009, stress echo 2010 and treadmill 2012.  6. Nonobstructive coronary atherosclerosis by coronary  CT angiogram 2012, calcium score of 23.  7. Excess alcohol consumption with elevated triglycerides.    RECOMMENDATIONS:  1. Dietary and exercise modification.  2. Start aspirin 81 mg daily and lisinopril 10 mg daily. Patient is going  to up titrate lisinopril to   20 mg daily after a week if his blood pressure is not improved. We also discussed potential   side effects of dry cough, rash, or lightheadedness in which case he will call us and can   switch to an angiotensin  receptor blocker if appropriate.  3. He is going to return for an  echocardiogram to screen for left ventricular hypertrophy and left   atrial enlargement as well as diastolic function.   4. If his blood pressure is reasonably controlled at  a follow-up visit in approximately three weeks, we   would proceed to treadmill stress testing for ischemic risk stratification as well as assessing blood   pressure control.       Encarnacion Slates, MD    SK/ds     cc: Luanne Bras MD    rw      ____________________________  TODAYS ORDERS  12 Lead ECG Today  Diet mgmt edu, guidance  and counseling TODAY  Patient Electronic Access Today  Return Visit 30 MIN 3 weeks  2D, color flow, doppler At Patient Convenience         Encarnacion Slates, MD

## 2020-09-01 NOTE — Progress Notes (Signed)
[facility] OFFICE  [address] [place], [place] [address]     Lorenda Hatchet    Date of Visit: 11/19/2014   Date of Birth: June 21, 1970  Age: 50 yrs.  Medical  Record Number: [id number]  __  CURRENT DIAGNOSES     1. Obesity, unspecified, E66.9  2. Pure  hypercholesterolemia, E78.0  3. Hypertension (essential or benign or malignant), I10  4. 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 Elevated blood-pressure reading, without diagnosis of hypertension, R03.0  __   ALLERGIES    No Known Drug Allergies  __  MEDICATIONS      1. aspirin 81 mg chewable tablet, 1 po qd  2. lisinopril 10 mg tablet, 2 po daily    __  HISTORY OF PRESENT ILLNESS  Mr. Marrazzo  is a 41 -year old gentleman who saw Dr. Talmadge Chad Oct 23, 2014. At that office visit his blood pressure was noted to be elevated at 152/108. He also has gained 23 pounds since last office visit. Mr. Mccuiston had an echocardiogram done that showed normal systolic  function with mild concentric LVH. He comes in to the office today as he started Lisinopril 10 mg daily and then increased to 20 mg approximately one week ago. His blood pressure remains elevated at 152/96. I rechecked it and it was still elevated at  160/100. He took his medication less than an hour prior to this office visit. He also admits that he missed Saturday and Sunday's dosing. He realizes that his inactivity along with his large appetite is his main downfall. He has gained an additional 8  pounds since last office visit. No symptoms of chest pain, palpitations or shortness of breath.   __  PAST HISTORY      Past Medical Illnesses: Hyperlipidemia, Hypertension, Obesity;  Past Cardiac Illnesses : No previous history of cardiac disease.; Infectious Diseases: No previous history of significant infectious diseases.;  Surgical Procedures: No previous surgical procedures.; Trauma History: No previous history of significant  trauma.; Cardiology Procedures-Invasive: No previous interventional or  invasive cardiology procedures.;  Cardiology Procedures-Noninvasive: Treadmill Stress Test September 2009, Stress Echocardiogram -Normal October 2010, treadmill stress test 05/2010, Coronary CT Angiogram June 2012, Echocardiogram June 2016;  Left Ventricular Ejection Fraction: LVEF of 65% documented via echocardiogram on 11/06/2014; Peripheral Vascular Procedures : Carotid NIVA October 2010  ___  FAMILY HISTORY   Father -- Myocardial infarction, CVA, Diabetes mellitus  PaternalGrandparent -- Heart disorder, Diabetes  mellitus, Pulmonary emphysema    __  CARDIAC RISK FACTORS     Tobacco Abuse : has never used tobacco; Family History of Heart Disease: positive; Hyperlipidemia : positive; Hypertension: positive;  Diabetes Mellitus : negative; Prior History of Heart Disease: negative; Obesity : positive; Sedentary Life Style:negative; HQI:[id number];  Menopausal:not applicable  __  SOCIAL HISTORY     Alcohol Use: drinks regularly and 15-20 drinks per week; Smoking: never smoked; Never smoker ([id number]);  Diet: Regular diet without modifications and Caffeine use-3-4 per day; Lifestyle: Married and 4 sons;  Exercise: Exercises occasionally; Occupation: Copywriter, 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   Height: 72"  BMI: 37   Pulse:  68/min. Apical   Respirations: 20/min. regular and relaxed       Constitutional: Cooperative, alert and oriented,well developed, well nourished,  in no acute distress. Skin:  warm and dry to touch Head: normocephalic, normal male hair pattern  Eyes: Rt eye with sty ENT: No pallor or cyanosis, normal  dentition Chest: clear to auscultation bilaterally, normal respiratory excursion Cardiac : Regular rhythm, S1 normal, S2 normal, No S3 or S4, Apical impulse not displaced, no murmurs, gallops or rubs detected. Peripheral Pulses:  pulses  full and equal in all extremities Extremities/Back: No spinal abnormalities noted., no edema present  Neurological: No gross motor or sensory deficits noted, affect appropriate, oriented to time, person and place.   __     Medications added today by the physician:    IMPRESSION:  1. Hypertension with mild LVH seen on  echocardiogram June 2016.  2. Hypercholesterolemia untreated.  3. Normal stress test 2009. Stress echo 2010 and treadmill 2012.  4. Non obstructive coronary atherosclerosis by coronary CT angiogram 2012, calcium score of 23.  5. Excessive  alcohol consumption with elevated triglycerides.  6. Obesity.  7. Strong family history of ischemic heart disease in his father.  8. 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     RECOMMENDATION:   1. Mr. Mossa is going to come in next week with his own machine to get his blood pressure   checked with an Char[employer] fundraiser. He tells me that his blood pressure at home has been running 135/70.    2. Encouraged better eating habits and also formal exercise.  3. Will check a BMP.  4. Will order a treadmill stress test for ischemic risk stratification as soon as blood pressure is   better controlled.    Adelfa Koh, ARNP  DB:lo     cc: Luanne Bras MD    EL      ____________________________  TODAYS ORDERS   Basic Metabolic Panel 2 days  B/P Check w/Nurse 3 days

## 2020-09-01 NOTE — Progress Notes (Signed)
[facility] OFFICE  [address] [place], [place] [address]     Lorenda Hatchet    Date of Visit: 11/28/2015   Date of Birth: 1970/10/07  Age: 50 yrs.  Medical  Record Number: [id number]  __  CURRENT DIAGNOSES     1. Obesity, unspecified, E66.9  2. Pure  hypercholesterolemia, unspecified, E78.00  3. Hypertension (essential or benign or malignant), I10  4. 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 Shortness of breath, R06.02  __   ALLERGIES    No Known Drug Allergies  __  MEDICATIONS      1. lisinopril 20 mg tablet, 1 po qd  __  CHIEF COMPLAINT/REASON FOR VISIT  Followup of Hypertension (essential or benign or malignant)   __  HISTORY OF PRESENT ILLNESS  Guy Martinez returns for clinical followup. He has not had any clear angina or heart failure symptoms. He occasionally  feels some shortness of breath when he goes up and down stairs. He has not been particularly successful in losing significant weight. We discussed the benefits of the Ideal Protein diet, and I have provided him information on that. 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  _____  PAST HISTORY     Past Medical Illnesses : Hyperlipidemia, Hypertension, Obesity;  Past Cardiac Illnesses: No previous history of cardiac disease.;  Infectious Diseases: No previous history of significant infectious diseases.; Surgical Procedures: No previous  surgical procedures.; Trauma History: No previous history of significant trauma.; Cardiology Procedures-Invasive : No previous interventional or invasive cardiology procedures.; Cardiology Procedures-Noninvasive: Treadmill Stress Test September 2009, Stress Echocardiogram  -Normal October 2010, treadmill stress test 05/2010, Coronary CT Angiogram June 2012, Echocardiogram June 2016; Left Ventricular Ejection Fraction: LVEF  of 65% documented via echocardiogram on 11/06/2014; Peripheral Vascular Procedures: Carotid NIVA October 2010   SOCIAL HISTORY    Alcohol Use: drinks regularly  and 15-20 drinks per week;  Smoking: never smoked; Never smoker ([id number]); Diet: Regular diet without modifications and Caffeine  use-3-4 per day; Lifestyle: Married and 4 sons; Exercise : Exercises occasionally; Occupation: Copywriter, 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   Height: 72"  BMI: 37   Pulse:  79/min.       Constitutional: Cooperative, alert and oriented,well developed, well nourished,  in no acute distress. Skin: warm and dry to touch Head : normocephalic, normal male hair pattern Eyes: conjunctivae and lids normal  ENT: No pallor or cyanosis, normal dentition Neck:  carotid pulses are full and equal bilaterally, no bruits Chest: clear to auscultation bilaterally, normal respiratory excursion  Cardiac: Regular rhythm, S1 normal, S2 normal, No S3 or S4, Apical impulse not displaced, no murmurs, gallops or rubs detected. Peripheral Pulses : pulses full and equal in all extremities Extremities/Back: No spinal abnormalities noted., no edema present  Neurological: No gross motor or sensory deficits noted, affect appropriate, oriented to time, person and place.   __     Medications added today by the physician:  lisinopril 20 mg tablet, 1 po qd, 90    IMPRESSIONS:    1. Hypertension with labile component and normal response on treadmill stress test December 13, 2014,   but resting elevation.  2. Mild left ventricular hypertrophy on echo November 06, 2014.  3.  Obesity with situational stress running his own company  and significant weight gain over the last   five to seven years.  4. Normal treadmill stress test 2016, 2009 and 2012.  5. Nonobstructive coronary atherosclerosis by coronary CT angiogram in 2012, calcium score of 23.  6. Excess alcohol  consumption with elevated triglycerides.  7. Hypercholesterolemia, untreated.  8. Strong family history of  ischemic heart disease in his father.    RECOMMENDATIONS:    1. The patient is interested in repeat treadmill stress testing, which is reasonable given his occasional   shortness of breath with exertion.  2. He is going to get repeat lipids, CMP and thyroid function.  3. He is going to consider  enrollment in the Ideal Protein diet. He is thinking about buying a pool, and I   am hoping that he will not buy the pool and he will enroll in the diet plan.  4. He should return for clinical followup in about a month, at which time we can hopefully  follow up labs and   get him back on the right track.     Encarnacion Slates, MD     Tid: [id number]:[id number]    cc: Luanne Bras MD    SJ  ____________________________   TODAYS ORDERS  12 Lead ECG Today  Diet_mgmt_edu,_guidance_and_counseling TODAY  Return Visit 15 MIN 1 month  Exercise Treadmill [facility] [employer]  Lipid Panel Today  Comprehensive  Metabolic Panel Today  TSH Today

## 2020-09-01 NOTE — Progress Notes (Signed)
Guy Martinez    Date of Visit: 08/23/2018   Date of Birth: 06-Oct-1970  Age: 50 yrs.  Medical  Record Number: [id number]  __  CURRENT DIAGNOSES     1. Obesity, unspecified, E66.9  2. Pure  hypercholesterolemia, unspecified, E78.00  3. Hypertension (essential or benign or malignant), I10  4. 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 Shortness of breath, R06.02  __   ALLERGIES    No Known Drug Allergies  __  MEDICATIONS      1. lisinopril 40 mg tablet, 1 po daily  __  CHIEF COMPLAINT/REASON FOR VISIT  Followup of Hypertension (essential or benign or malignant),  Followup of Pure hypercholesterolemia and unspecified  __  HISTORY OF PRESENT ILLNESS  This is a telephone visit from 3:00 p.m. to 3:14 p.m. on  Guy Martinez for followup of hypertension. The patient has been doing reasonably well in the last couple of years and likes to keep in touch with a cardiologist given his family history of ischemic heart disease. He has not had any chest pain or dyspnea.  He is trying to follow appropriate precautions given the COVID-19 pandemic. He measured his blood pressure at home, and it was elevated, but acknowledges that he has put on about 19 pounds in weight since we saw him last. He did establish a primary care  provider in Dr. Jennette Kettle Dr. Liana Gerold. We are going to request labs as those were done about six months ago. Overall, he seems to be doing reasonably well. He mentions some mild difficulty with erectile dysfunction. He is going to try and be more diligent  about diet and exercise modification.  __  PAST HISTORY     Past Medical Illnesses : Hyperlipidemia, Hypertension, Obesity;  Past Cardiac Illnesses: No previous history of cardiac disease.;  Infectious Diseases: No previous history of significant infectious diseases.; Surgical Procedures: No previous  surgical procedures.; Trauma History: No previous history of significant trauma.; Cardiology Procedures-Invasive : No previous interventional or  invasive cardiology procedures.; Cardiology Procedures-Noninvasive: Treadmill Stress Test September 2009, January 2012,  July 2017, Stress Echocardiogram -Normal October 2010, Coronary CT Angiogram June 2012, Echocardiogram June 2016, Treadmill Stress Test, 12/12/15; Left Ventricular Ejection Fraction : LVEF of 65% documented via echocardiogram on 11/06/2014; Peripheral Vascular Procedures: Carotid NIVA October 2010   SOCIAL HISTORY    Alcohol Use: drinks regularly and 15-20 drinks per week;  Smoking: never smoked; Never smoker ([id number]); Diet: Regular diet without modifications and Caffeine  use-3-4 per day; Lifestyle: Married and 4 sons; Exercise : Exercises occasionally; Occupation: Copywriter, 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 Hypertension with labile component, apparently improved six months ago, but now elevated with associated weight gain.  2. Normal response to treadmill stress test in 2016 but hypertensive  on last test December 12, 2015.  3. Obesity with waxing and waning success at weight management. Prior significant loss in the Ideal Protein program.  4. Mild left ventricular hypertrophy on echo November 06, 2014.  5. Normal treadmill stress test  2017, 2015 2012 and 2009.  6. Nonobstructive coronary atherosclerosis by coronary CTA in 2012. Calcium score of 23.  7. Hypercholesterolemia at last check in 2015, but with lipids at target by patient report in 2019.  8. Excess alcohol consumption  with elevated triglycerides.  9. Strong family history of ischemic heart disease in his father with associated cardiac anxiety.    RECOMMENDATIONS:    1. Diet  and exercise modification.  2. We will review lipids once received and provide advice as to whether the patient needs to be on lipid-lowering therapy.  3. We will refill the lisinopril at patient request. He would like to stay in  touch with [place] and agrees to clinical followup in one year.    Encarnacion Slates, MD     Tid: [id number]:WU    cc:  Floydene Flock MD  ____________________________  TODAYS ORDERS   Diet_mgmt_edu,_guidance_and_counseling TODAY  Return Visit 15 MIN 1 year

## 2020-09-03 ENCOUNTER — Encounter: Payer: No Typology Code available for payment source | Admitting: Internal Medicine

## 2020-10-10 ENCOUNTER — Encounter: Payer: Self-pay | Admitting: Internal Medicine

## 2020-10-10 ENCOUNTER — Ambulatory Visit (INDEPENDENT_AMBULATORY_CARE_PROVIDER_SITE_OTHER): Payer: No Typology Code available for payment source | Admitting: Internal Medicine

## 2020-10-10 VITALS — BP 150/95 | HR 69 | Temp 98.1°F | Resp 14 | Ht 70.7 in | Wt 285.9 lb

## 2020-10-10 DIAGNOSIS — Z6841 Body Mass Index (BMI) 40.0 and over, adult: Secondary | ICD-10-CM

## 2020-10-10 DIAGNOSIS — Z0000 Encounter for general adult medical examination without abnormal findings: Secondary | ICD-10-CM

## 2020-10-10 DIAGNOSIS — U071 COVID-19: Secondary | ICD-10-CM

## 2020-10-10 DIAGNOSIS — E782 Mixed hyperlipidemia: Secondary | ICD-10-CM

## 2020-10-10 DIAGNOSIS — I10 Essential (primary) hypertension: Secondary | ICD-10-CM

## 2020-10-10 NOTE — Progress Notes (Signed)
Audiology test: Results reviewed with patient in detail.     Vision test:  Results reviewed with patient in detail.     Pulmonary Function Test: Deferred due to COVID 19 Pandemic.  Patient Education: Reviewed InBody with patient.   Fitness Evaluation:    Body Fat as percentage: In men, over 25% is obese, 20-25% is higher than normal, 16-20% is healthy / normal, <16% or under is considered lean / ideal.   2022 = 36.1%   2021= 36.5%     Visceral Fat: Abdominal "belly" fat.Visceral fat is a type of body fat that exists in the abdomen and surrounds the internal organs. Everyone has some, especially those who are sedentary, chronically stressed, or maintain unhealthy diets. A different type of fat -- subcutaneous fat -- which builds up under the skin, has less of a negative impact on health and is easier to lose than visceral fat. A high level of visceral fat can increase your risk for serious health problems including cardiovascular disease, types 2 diabetes, and increased blood pressure.     2022 = 20 (normal <10).    2021= 20    Current Exercise Program:   Reports he does not really workout.    Fitness Goals:   Decrease weight.   Increase muscle mass.   Decrease stress.    Blood draw:  Using aseptic technique, blood was drawn from the right antecubital without any complication. Attempt # 1. Patient tolerated procedure well. All labs will be sent to [facility].    -2 SSTs      -1 Lav    -1 Yellow top for UA    InBody completed and sent with patient.    Patient tolerated procedure well and left in good condition today.

## 2020-10-10 NOTE — Progress Notes (Signed)
Subjective:      Patient ID: Guy Martinez is a 50 y.o. male.    Chief Complaint:  Chief Complaint   Patient presents with   . Annual Exam       HPI:  Guy Martinez is a 50 y.o. male here for annual physical.    Specific concerns: None    Past Medical History:    HTN - lisinopril, managed by [facility].     Family history of early cardiac disease - Evaluated at [facility] for non-obstructive disease.     Low Vitamin D -has not been regularly taking his vitamin D supplementation    Numerous moles -following closely with Dr. Cyndie Chime of [facility]  Health Maintenance  Colonoscopy: Overdue  Testicular Exams: Done regularly  Derm: Following with Dr. Cyndie Chime ([facility])  Dental: Annually    Surgical History: Reviewed and noted below  Family History: Reviewed and noted below. Father with history of early heart attack.  Social Hisory: Reviewed and noted below    Family: Lives with wife and four kids. 19, 17, 16, 50 yo. Rottie mix and another rescue.  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  Stress: High. Good oulets and support network among colleagues and friends.  Hobbies: Golf in the 58s. Kids.  Exercise: No formal exercise. Recently got the hydrorow and mirror.  Diet: Could use work. Eating out more.  Sleep: 6.5-7.5 hrs/night. No difficulty falling or staying asleep. Wife reports snoring has worsened with weight gain. Well rested in the AM and no daytime somnolence.        Problem List:  Patient Active Problem List   Diagnosis   . HLD (hyperlipidemia)   . Morbid obesity   . HTN (hypertension)   . Shortness of breath   . Actinic keratosis   . Sebaceous cyst   . Body mass index 45.0-49.9, adult   . COVID-19 virus infection - 2 positive home tests       Current Medications:  Current Outpatient Medications   Medication Sig Dispense Refill   . atorvastatin (LIPITOR) 10 MG tablet TAKE 1 TABLET BY MOUTH EVERY DAY 90 tablet 1   . Cholecalciferol (Vitamin D) 50 MCG (2000 UT) Cap Take 50  mcg by mouth daily     . lisinopril (ZESTRIL) 40 MG tablet Take 1 tablet (40 mg total) by mouth daily 90 tablet 2   . tadalafil (CIALIS) 5 MG tablet Take 1 tablet (5 mg total) by mouth daily as needed for Erectile Dysfunction 20 tablet 0     No current facility-administered medications for this visit.       Allergies:  No Known Allergies    Past Medical History:  Past Medical History:   Diagnosis Date   . H/O Coronary CT Angiogram 10/2010   . H/O echocardiogram 10/2014   . H/O stress echocardiogram 02/2009    normal   . H/O treadmill stress test 01/2008, 05/2010, 11/2015, 12/12/15   . Hyperlipidemia    . Hypertension    . Obesity    . Seasonal allergic rhinitis     Seasonal - Ragweed       Past Surgical History:  Past Surgical History:   Procedure Laterality Date   . 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       Family History:  Family History   Problem Relation Age of Onset   . Diverticulitis Mother    . Cancer Father 53  Prostate, Bladder   . Heart attack Father 45        4 MI's   . Stroke Father 68        2 strokes   . Hypertension Father    . Diabetes Father    . Hyperlipidemia Father    . Heart disease Father    . Myocardial Infarction Father    . No known problems Sister    . No known problems Brother    . No known problems Son    . No known problems Paternal Grandmother    . COPD Paternal Grandfather    . Heart attack Paternal Grandfather    . Diabetes Paternal Grandfather    . Appendicitis Son    . Asthma Son    . No known problems Son    . Pancreatic cancer Paternal Uncle 38   . Heart disease Paternal Grandparent    . Diabetes Paternal Grandparent    . Emphysema Paternal Grandparent         pulmonary       Social History:  Social History     Socioeconomic History   . Marital status: Married   Tobacco Use   . Smoking status: Former Smoker     Types: Cigarettes     Quit date: 2012     Years since quitting: 10.3   . Smokeless tobacco: Former Neurosurgeon     Types: Snuff     Quit date: 2010   . Tobacco comment: Social use. 5-7  cig/week   Vaping Use   . Vaping Use: Never used   Substance and Sexual Activity   . Alcohol use: Yes     Comment: regularly, 15-20 per week   . Drug use: Never   . Sexual activity: Yes     Partners: Female     Birth control/protection: None        The following sections were reviewed this encounter by the provider:          ROS:  Review of Systems   Constitutional: Negative for chills, fatigue and fever.   HENT: Negative for ear discharge, ear pain, postnasal drip, rhinorrhea, sinus pressure, sinus pain, sneezing, sore throat and trouble swallowing.    Eyes: Negative for pain, discharge and visual disturbance.   Respiratory: Negative for cough, chest tightness, shortness of breath and wheezing.    Cardiovascular: Negative for chest pain, palpitations and leg swelling.   Gastrointestinal: Negative for abdominal pain, blood in stool, constipation, diarrhea, nausea and vomiting.   Genitourinary: Negative for difficulty urinating, flank pain, frequency, hematuria, penile discharge, penile pain, penile swelling, scrotal swelling, testicular pain and urgency.   Musculoskeletal: Negative for arthralgias, back pain, gait problem, joint swelling, myalgias, neck pain and neck stiffness.   Neurological: Negative for dizziness, seizures, syncope, speech difficulty, weakness, light-headedness, numbness and headaches.   Psychiatric/Behavioral: Negative for agitation and sleep disturbance. The patient is not nervous/anxious.        Vitals:  BP (!) 150/95 (BP Site: Right arm, Patient Position: Sitting, Cuff Size: Large)   Pulse 69   Temp 98.1 F (36.7 C) (Oral)   Resp 14   SpO2 97%      Objective:     Physical Exam:  Physical Exam  Vitals reviewed.   Constitutional:       General: He is not in acute distress.     Appearance: Normal appearance. He is normal weight. He is not ill-appearing, toxic-appearing or diaphoretic.   HENT:  Head: Normocephalic and atraumatic.      Right Ear: Tympanic membrane, ear canal and external  ear normal. There is no impacted cerumen.      Left Ear: Tympanic membrane, ear canal and external ear normal. There is no impacted cerumen.   Eyes:      General: No scleral icterus.        Right eye: No discharge.         Left eye: No discharge.      Extraocular Movements: Extraocular movements intact.      Conjunctiva/sclera: Conjunctivae normal.      Pupils: Pupils are equal, round, and reactive to light.   Neck:      Vascular: No carotid bruit.   Cardiovascular:      Rate and Rhythm: Normal rate and regular rhythm.      Pulses: Normal pulses.      Heart sounds: Normal heart sounds. No murmur heard.    No friction rub. No gallop.   Pulmonary:      Effort: Pulmonary effort is normal. No respiratory distress.      Breath sounds: Normal breath sounds. No stridor. No wheezing, rhonchi or rales.   Chest:      Chest wall: No tenderness.   Abdominal:      General: Abdomen is flat. There is no distension.      Palpations: Abdomen is soft. There is no mass.      Tenderness: There is no abdominal tenderness. There is no right CVA tenderness, left CVA tenderness, guarding or rebound.      Hernia: No hernia is present.   Genitourinary:     Penis: Normal.       Testes: Normal.   Musculoskeletal:         General: No swelling, tenderness, deformity or signs of injury. Normal range of motion.      Cervical back: Normal range of motion and neck supple. No rigidity or tenderness.      Right lower leg: No edema.      Left lower leg: No edema.   Lymphadenopathy:      Cervical: No cervical adenopathy.   Skin:     General: Skin is warm and dry.      Capillary Refill: Capillary refill takes less than 2 seconds.      Coloration: Skin is not jaundiced or pale.      Findings: No bruising, erythema, lesion or rash.      Comments: Numerous moles and skin damage   Neurological:      General: No focal deficit present.      Mental Status: He is alert. Mental status is at baseline.      Cranial Nerves: No cranial nerve deficit.      Sensory: No  sensory deficit.      Motor: No weakness.      Coordination: Coordination normal.      Gait: Gait normal.      Deep Tendon Reflexes: Reflexes normal.   Psychiatric:         Mood and Affect: Mood normal.         Behavior: Behavior normal.         Thought Content: Thought content normal.         Judgment: Judgment normal.          Assessment:     1. Encounter for screening and preventative care  - Urinalysis with microscopic    2. Preventative health care  - Vitamin D,25  OH, Total  - TSH  - PSA  - Lipid panel  - Comprehensive metabolic panel  - CBC and differential    3. Mixed hyperlipidemia  - Lipid panel  - Comprehensive metabolic panel    4. Primary hypertension  - Comprehensive metabolic panel      Plan:     HTN - lisinopril, managed by [facility].  Slightly elevated today.  Asked patient to track blood pressure.  If less than 130/80 no further changes needed.  If consistently over 140/90 recommended that he reach out to cardiology for titration of medication.    Family history of early cardiac disease - Evaluated at [facility] for non-obstructive disease.     Low Vitamin D -pending repeat blood work    Numerous moles -following closely with Dr. Cyndie Chime of [facility]    Obesity - Discussed safe and effective changes to diet and exercise. Recommended exercise should focus on a combination of cardio, core strength, stabilizing muscles, and flexibility. Recommended considering a heart healthy diet. Advised that small steady changes will lead to greater and more sustainable long term results.    Snoring -asked patient to follow-up with [facility] for possible sleep study.  If unable to do so we will send the referral to [facility] sleep clinic.    Health Maintenance  - Pfizer 03/15/2020 08/18/2019 08/01/2019  - Flu shot due fall 2020  - Shingles patient will call back to schedule  - Pneumonia Vaccine: At 50 years old  - Tdap: 07/02/2019  - Hep C: Negative 2021  - Colonoscopy referral provided  - Dexa at 50  years old  - LDCT: Order given with history of tobacco use  - AAA: Screen due at 50 years old  - PSA pending  - Derm follows with [facility]    EKG: Normal Sinus Rhythm. Normal axis. Normal intervals. No ST elevations, ST depression, q waves, or inverted T waves appreciated.  Vision: Does not see an eye doctor Corrective lenses - readers  Hearing: Decreased at higher frequencies bilaterally. Worse than last year.  In Body: Weight 285.9 lbs, SMM 104.9 lbs, PBF 36.1%    Advance directive: Example provided    MyChart follow-up once patient results are back in  Office follow-up in 12 months for repeat physical or sooner as needed    Sarina Ill, MD

## 2020-10-11 LAB — PSA: Prostate Specific Antigen, Total: 0.69 ng/mL (ref <=4.00)

## 2020-10-11 LAB — CBC AND DIFFERENTIAL
Baso(Absolute): 52 cells/uL (ref 0–200)
Basophils: 0.9 %
Eosinophils Absolute: 122 cells/uL (ref 15–500)
Eosinophils: 2.1 %
Hematocrit: 41.4 % (ref 38.5–50.0)
Hemoglobin: 14.2 g/dL (ref 13.2–17.1)
Lymphocytes Absolute: 2047 cells/uL (ref 850–3900)
Lymphocytes: 35.3 %
MCH: 30.5 pg (ref 27.0–33.0)
MCHC: 34.3 g/dL (ref 32.0–36.0)
MCV: 89 fL (ref 80.0–100.0)
MPV: 9.7 fL (ref 7.5–12.5)
Monocytes Absolute: 441 cells/uL (ref 200–950)
Monocytes: 7.6 %
Neutrophils Absolute: 3138 cells/uL (ref 1500–7800)
Neutrophils: 54.1 %
Platelets: 213 10*3/uL (ref 140–400)
RBC: 4.65 10*6/uL (ref 4.20–5.80)
RDW: 13.1 % (ref 11.0–15.0)
WBC: 5.8 10*3/uL (ref 3.8–10.8)

## 2020-10-11 LAB — COMPREHENSIVE METABOLIC PANEL
ALT: 61 U/L — ABNORMAL HIGH (ref 9–46)
AST (SGOT): 33 U/L (ref 10–35)
Albumin/Globulin Ratio: 2.5 (calc) (ref 1.0–2.5)
Albumin: 4.7 g/dL (ref 3.6–5.1)
Alkaline Phosphatase: 65 U/L (ref 35–144)
BUN: 18 mg/dL (ref 7–25)
Bilirubin, Total: 0.7 mg/dL (ref 0.2–1.2)
CO2: 29 mmol/L (ref 20–32)
Calcium: 9.3 mg/dL (ref 8.6–10.3)
Chloride: 106 mmol/L (ref 98–110)
Creatinine: 1.19 mg/dL (ref 0.70–1.33)
EGFR African American: 82 mL/min/{1.73_m2} (ref >=60)
Globulin: 1.9 g/dL (calc) (ref 1.9–3.7)
Glucose: 104 mg/dL — ABNORMAL HIGH (ref 65–99)
NON-AFRICAN AMERICA EGFR: 71 mL/min/{1.73_m2} (ref >=60)
Potassium: 4.5 mmol/L (ref 3.5–5.3)
Protein, Total: 6.6 g/dL (ref 6.1–8.1)
Sodium: 140 mmol/L (ref 135–146)

## 2020-10-11 LAB — URINALYSIS WITH MICROSCOPIC
Bilirubin, UA: NEGATIVE
Blood, UA: NEGATIVE
Glucose Qualitative: NEGATIVE
Hyaline Casts, UA: NONE SEEN /LPF
Ketones UA: NEGATIVE
Leukocyte Esterase, UA: NEGATIVE
Nitrites: NEGATIVE
Protein, UA: NEGATIVE
RBC UA: NONE SEEN /HPF (ref <=2)
Specific Gravity, UA: 1.02 (ref 1.001–1.035)
Squam Epithel, UA: NONE SEEN /HPF (ref <=5)
Urine Bacteria: NONE SEEN /HPF
WBC, UA: NONE SEEN /HPF (ref <=5)
pH: 5.5 (ref 5.0–8.0)

## 2020-10-11 LAB — LIPID PANEL
Cholesterol / HDL Ratio: 4.5 (calc) (ref <5.0)
Cholesterol: 180 mg/dL (ref <200)
HDL: 40 mg/dL (ref >=40)
LDL Calculated: 112 mg/dL (calc) — ABNORMAL HIGH
NON HDL CHOLESTEROL: 140 mg/dL (calc) — ABNORMAL HIGH (ref <130)
Triglycerides: 161 mg/dL — ABNORMAL HIGH (ref <150)

## 2020-10-11 LAB — VITAMIN D,25 OH,TOTAL: Vitamin D, 25 OH, Total: 23 ng/mL — ABNORMAL LOW (ref 30–100)

## 2020-10-11 LAB — TSH: TSH: 2.87 mIU/L (ref 0.40–4.50)

## 2020-10-14 ENCOUNTER — Other Ambulatory Visit: Payer: Self-pay | Admitting: Internal Medicine

## 2020-10-14 ENCOUNTER — Encounter: Payer: Self-pay | Admitting: Internal Medicine

## 2020-10-14 DIAGNOSIS — Z0000 Encounter for general adult medical examination without abnormal findings: Secondary | ICD-10-CM

## 2020-10-14 DIAGNOSIS — R7301 Impaired fasting glucose: Secondary | ICD-10-CM

## 2020-10-14 DIAGNOSIS — E7800 Pure hypercholesterolemia, unspecified: Secondary | ICD-10-CM

## 2020-10-14 DIAGNOSIS — E782 Mixed hyperlipidemia: Secondary | ICD-10-CM

## 2020-10-14 MED ORDER — ATORVASTATIN CALCIUM 20 MG PO TABS
20.0000 mg | ORAL_TABLET | Freq: Every day | ORAL | 0 refills | Status: DC
Start: 2020-10-14 — End: 2021-02-06

## 2020-10-29 ENCOUNTER — Encounter (INDEPENDENT_AMBULATORY_CARE_PROVIDER_SITE_OTHER): Payer: Self-pay | Admitting: Sleep Medicine

## 2020-10-29 ENCOUNTER — Ambulatory Visit (INDEPENDENT_AMBULATORY_CARE_PROVIDER_SITE_OTHER): Payer: No Typology Code available for payment source | Admitting: Sleep Medicine

## 2020-10-29 VITALS — BP 134/98 | HR 67 | Ht 71.0 in | Wt 284.0 lb

## 2020-10-29 DIAGNOSIS — G4733 Obstructive sleep apnea (adult) (pediatric): Secondary | ICD-10-CM

## 2020-10-29 NOTE — Patient Instructions (Signed)
Dear Guy Martinez,    It was a pleasure seeing you today for our Sleep Medicine Appointment.     You will be scheduled for your sleep study  In some cases, sleep testing type or location may need to be adjusted due to specific insurance requirements. If this is the case, our team will reach out to you to make the appropriate adjustments.   We will see you after the study to discuss the results and potential treatment options.   Please avoid driving and other tasks requiring sustained vigilance if feeling sleepy.    Have a great day,    Shary Key, MD

## 2020-10-29 NOTE — Progress Notes (Signed)
[facility] SLEEP OFFICE CONSULTATION NOTE    HRT [place]  [facility] -CARDIOLOGY  [address]  [place] [address]  Dept: [phone]  Dept Fax: [phone]         Patient Name: [facility]    Date of Visit:  October 29, 2020  Date of Birth: May 11, 1971  AGE: 50 y.o.  Medical Record #: [id number]    Referring Physician: Buzzy Han MD    CHIEF COMPLAINT:    Chief Complaint   Patient presents with   . Consult (Initial)     Sleep apnea        HISTORY OF PRESENT ILLNESS    Mr. Mckeone is being seen today for sleep evaluation at the [facility]   50 year old man, new sleep consultation, referred to [place] from primary care Dr. Selena Batten.    Medical history includes hypertension on lisinopril followed by [facility] cards, family history of cardiac disease, low vitamin D, with current complaint of loud snoring worse recently with weight gain, ultimately suggested he undergo testing for possible sleep apnea.  Elevated BMI of 40.21.    Loud snoring per wife/kids, occasional dozing on couch. Rare daytime sleepiness, no gasping. No known fmhx osa.  Denies parasomnia, cataplexy, sleep paralysis.  Never had testing there was open to it.    PAST MEDICAL HISTORY: He has a past medical history of H/O Coronary CT Angiogram (10/2010), H/O echocardiogram (10/2014), H/O stress echocardiogram (02/2009), H/O treadmill stress test (01/2008, 05/2010, 11/2015, 12/12/15), Hyperlipidemia, Hypertension, Obesity, and Seasonal allergic rhinitis. 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    ALLERGIES: No Known Allergies    MEDICATIONS:       Current Outpatient Medications:   .  atorvastatin (LIPITOR) 20 MG tablet, Take 1 tablet (20 mg total) by mouth daily, Disp: 90 tablet, Rfl: 0  .  lisinopril (ZESTRIL) 40 MG tablet, Take 1 tablet (40 mg total) by mouth daily, Disp: 90 tablet, Rfl: 2  .  tadalafil (CIALIS) 5 MG tablet, Take 1 tablet (5 mg total) by mouth daily  as needed for Erectile Dysfunction, Disp: 20 tablet, Rfl: 0  .  vitamin D (CHOLECALCIFEROL) 25 MCG (1000 UT) tablet, Take 5,000 Units by mouth daily, Disp: , Rfl:       FAMILY HISTORY: family history includes Appendicitis in his son; Asthma in his son; COPD in his paternal grandfather; Cancer (age of onset: 69) in his father; Diabetes in his father, paternal grandfather, and paternal grandparent; Diverticulitis in his mother; Emphysema in his paternal grandparent; Heart attack in his paternal grandfather; Heart attack (age of onset: 58) in his father; Heart disease in his father and paternal grandparent; Hyperlipidemia in his father; Hypertension in his father; Myocardial Infarction in his father; No known problems in his brother, paternal grandmother, sister, son, and son; Pancreatic cancer (age of onset: 72) in his paternal uncle; Stroke (age of onset: 76) in his father.      SOCIAL HISTORY: He reports that he quit smoking about 10 years ago. His smoking use included cigarettes. He quit smokeless tobacco use about 12 years ago.  His smokeless tobacco use included snuff. He reports current alcohol use of about 15.0 - 20.0 standard drinks of alcohol per week. He reports that he does not use drugs.    REVIEW OF SYSTEMS:   Gen: Denies weight changes, no fatigue  HEENT: denies upper airway procedures. No congestion. No swallowing difficults. No bruxism  Resp:  No dyspnea or cough  Cards: No chest pains or palpitation  GI: no n/v/d  G/U: no changes  Neuro: no strength or sensation changes  Skin: no rashes or discoloration  Psych: mood stable      PHYSICAL EXAMINATION    Visit Vitals  BP (!) 134/98 (BP Site: Left arm, Patient Position: Sitting, Cuff Size: Large)   Pulse 67   Ht 1.803 m (5\' 11" )   Wt 128.8 kg (284 lb)   BMI 39.61 kg/m        General Appearance:  A well-appearing male in no acute distress.  Does not appear sleepy.   Skin: Warm and dry to touch, no apparent skin lesions, or masses noted.  Head:  Normocephalic, normal hair pattern, no masses or tenderness   Eyes: EOMS Intact, PERRL, conjunctivae and lids unremarkable.  Funduscopic exam and visual fields not performed.   ENT: Ears, Nose and throat reveal no gross abnormalities.  No pallor or cyanosis.  Dentition good.  No micrognathia. Large tongue/scalloping. Uvula midline.  Neck: JVP normal, no carotid bruit, thyroid not enlarged   Chest: Clear to auscultation bilaterally with good air movement and respiratory effort and no wheezes, rales, or rhonchi   Cardiovascular: Regular rhythm, S1 normal, S2 normal, No S3 or S4, Apical impulse not displaced, no murmurs, gallops or rubs detected   Abdomen: Soft, nontender, nondistended, with normoactive bowel sounds. No organomegaly.  No pulsatile masses, or bruits.   Extremities: Warm without edema, clubbing, or cyanosis. All peripheral pulses are full and equal.   Neuro: Alert and oriented x3. No gross motor or sensory deficits noted, affect appropriate.          LABS:   Lab Results   Component Value Date    AST 33 10/10/2020    ALT 61 (H) 10/10/2020     No results found for: CBC, IRON, TIBC, FERRITIN  Lab Results   Component Value Date    TSH 2.87 10/10/2020         IMPRESSION:   Mr. Guy Martinez is a 50 y.o. male with the following problems:    1. Loud snoring, concern sleep apnea  2. Hypertension  3. Obesity with BMI 40.21  4. Hyperlipidemia      RECOMMENDATIONS:    Snoring/non restorative sleep - will evaluate for sleep apnea  -Concern OSA in this patient with clinical symptoms including but not necessarily limited to: snoring, daytime sleepiness, non-refreshing sleep, dry mouth on waking  -Risk factors include: BMI>30, large neck circumference, older age, male gender, and Family history.   - we discussed pathophysiology of OSA in addition to briefly reviewing potential treatment options such as PAP, MADs, tongue stimulation and surgical interventions.  - will evaluate for sleep-disordered breathing with PSG per strong  preference.  Did discuss possibility of insurance decline.  -we discussed how body position during sleep, weight, alcohol and certain anxiety/muscle medications can affect sleep apnea.   - close follow-up with test results review office visit within 2 wks of diagnostic sleep study.  - PAP treatment to follow if indicated vs alternatives that were briefly discussed.  - patient knows not to drive or operate machinery if at all drowsy  - in the mean time, HOB elevation and non-supine sleeping may be helpful  Orders Placed This Encounter   Procedures   . Polysomnogram (HRT [place]   . Sleep APP Visit (HRT [place])       No orders of the defined types were placed in this encounter.        SIGNED:    Shary Key, MD         This note was generated by the Dragon speech recognition and may contain errors or omissions not intended by the user. Grammatical errors, random word insertions, deletions, pronoun errors, and incomplete sentences are occasional consequences of this technology due to software limitations. Not all errors are caught or corrected. If there are questions or concerns about the content of this note or information contained within the body of this dictation, they should be addressed directly with the author for clarification.

## 2020-11-14 DIAGNOSIS — R234 Changes in skin texture: Secondary | ICD-10-CM | POA: Insufficient documentation

## 2020-11-14 DIAGNOSIS — B353 Tinea pedis: Secondary | ICD-10-CM | POA: Insufficient documentation

## 2020-11-14 DIAGNOSIS — D225 Melanocytic nevi of trunk: Secondary | ICD-10-CM | POA: Insufficient documentation

## 2020-11-14 DIAGNOSIS — L99 Other disorders of skin and subcutaneous tissue in diseases classified elsewhere: Secondary | ICD-10-CM | POA: Insufficient documentation

## 2020-11-14 DIAGNOSIS — L659 Nonscarring hair loss, unspecified: Secondary | ICD-10-CM | POA: Insufficient documentation

## 2020-11-14 DIAGNOSIS — D229 Melanocytic nevi, unspecified: Secondary | ICD-10-CM | POA: Insufficient documentation

## 2020-11-14 DIAGNOSIS — T66XXXA Radiation sickness, unspecified, initial encounter: Secondary | ICD-10-CM | POA: Insufficient documentation

## 2020-11-14 DIAGNOSIS — E559 Vitamin D deficiency, unspecified: Secondary | ICD-10-CM | POA: Insufficient documentation

## 2020-11-14 DIAGNOSIS — L739 Follicular disorder, unspecified: Secondary | ICD-10-CM | POA: Insufficient documentation

## 2020-11-14 DIAGNOSIS — L821 Other seborrheic keratosis: Secondary | ICD-10-CM | POA: Insufficient documentation

## 2020-11-21 ENCOUNTER — Ambulatory Visit (INDEPENDENT_AMBULATORY_CARE_PROVIDER_SITE_OTHER): Payer: No Typology Code available for payment source | Admitting: Cardiovascular Disease

## 2020-12-03 ENCOUNTER — Other Ambulatory Visit: Payer: No Typology Code available for payment source

## 2020-12-04 ENCOUNTER — Encounter (INDEPENDENT_AMBULATORY_CARE_PROVIDER_SITE_OTHER): Payer: No Typology Code available for payment source

## 2020-12-04 ENCOUNTER — Other Ambulatory Visit: Payer: No Typology Code available for payment source

## 2020-12-04 ENCOUNTER — Other Ambulatory Visit (INDEPENDENT_AMBULATORY_CARE_PROVIDER_SITE_OTHER): Payer: No Typology Code available for payment source

## 2020-12-05 ENCOUNTER — Ambulatory Visit (INDEPENDENT_AMBULATORY_CARE_PROVIDER_SITE_OTHER): Payer: No Typology Code available for payment source

## 2020-12-05 DIAGNOSIS — E7800 Pure hypercholesterolemia, unspecified: Secondary | ICD-10-CM

## 2020-12-05 DIAGNOSIS — R7301 Impaired fasting glucose: Secondary | ICD-10-CM

## 2020-12-05 DIAGNOSIS — G4733 Obstructive sleep apnea (adult) (pediatric): Secondary | ICD-10-CM

## 2020-12-05 NOTE — Progress Notes (Signed)
Labs drawn from the left [facility] using aseptic technique without any problem.  Lab specimens sent to [facility].

## 2020-12-06 LAB — COMPREHENSIVE METABOLIC PANEL
ALT: 50 U/L — ABNORMAL HIGH (ref 9–46)
AST (SGOT): 26 U/L (ref 10–35)
Albumin/Globulin Ratio: 2.3 (calc) (ref 1.0–2.5)
Albumin: 4.5 g/dL (ref 3.6–5.1)
Alkaline Phosphatase: 71 U/L (ref 35–144)
BUN: 20 mg/dL (ref 7–25)
Bilirubin, Total: 0.6 mg/dL (ref 0.2–1.2)
CO2: 20 mmol/L (ref 20–32)
Calcium: 8.8 mg/dL (ref 8.6–10.3)
Chloride: 107 mmol/L (ref 98–110)
Creatinine: 1.23 mg/dL (ref 0.70–1.33)
EGFR African American: 79 mL/min/{1.73_m2} (ref >=60)
Globulin: 2 g/dL (calc) (ref 1.9–3.7)
Glucose: 100 mg/dL — ABNORMAL HIGH (ref 65–99)
NON-AFRICAN AMERICA EGFR: 68 mL/min/{1.73_m2} (ref >=60)
Potassium: 4.4 mmol/L (ref 3.5–5.3)
Protein, Total: 6.5 g/dL (ref 6.1–8.1)
Sodium: 138 mmol/L (ref 135–146)

## 2020-12-06 LAB — SPECIMEN INTEGRITY COMPROMISED

## 2020-12-06 LAB — LIPID PANEL
Cholesterol / HDL Ratio: 4.6 (calc) (ref <5.0)
Cholesterol: 153 mg/dL (ref <200)
HDL: 33 mg/dL — ABNORMAL LOW (ref >=40)
LDL Calculated: 93 mg/dL (calc)
NON HDL CHOLESTEROL: 120 mg/dL (calc) (ref <130)
Triglycerides: 173 mg/dL — ABNORMAL HIGH (ref <150)

## 2020-12-06 LAB — HEMOGLOBIN A1C: Hemoglobin A1C: 5.1 % of total Hgb (ref <5.7)

## 2020-12-18 ENCOUNTER — Encounter (INDEPENDENT_AMBULATORY_CARE_PROVIDER_SITE_OTHER): Payer: No Typology Code available for payment source | Admitting: Sleep Medicine

## 2021-02-06 ENCOUNTER — Other Ambulatory Visit: Payer: Self-pay | Admitting: Internal Medicine

## 2021-02-06 DIAGNOSIS — Z0000 Encounter for general adult medical examination without abnormal findings: Secondary | ICD-10-CM

## 2021-02-06 DIAGNOSIS — E782 Mixed hyperlipidemia: Secondary | ICD-10-CM

## 2021-02-06 MED ORDER — ATORVASTATIN CALCIUM 20 MG PO TABS
20.0000 mg | ORAL_TABLET | Freq: Every day | ORAL | 3 refills | Status: DC
Start: 2021-02-06 — End: 2022-02-15

## 2021-02-06 MED ORDER — ATORVASTATIN CALCIUM 20 MG PO TABS
40.0000 mg | ORAL_TABLET | Freq: Every day | ORAL | 3 refills | Status: DC
Start: 2021-02-06 — End: 2021-02-06

## 2021-02-06 NOTE — Telephone Encounter (Signed)
Refilled atorvastatin at 20mg .     The 10-year ASCVD risk score (Arnett DK, et al., 2019) is: 5.1%    Values used to calculate the score:      Age: 50 years      Sex: Male      Is Non-Hispanic African American: No      Diabetic: No      Tobacco smoker: No      Systolic Blood Pressure: 134 mmHg      Is BP treated: Yes      HDL Cholesterol: 33 mg/dL      Total Cholesterol: 153 mg/dL

## 2021-02-25 ENCOUNTER — Encounter: Payer: Self-pay | Admitting: Internal Medicine

## 2021-03-10 ENCOUNTER — Encounter (INDEPENDENT_AMBULATORY_CARE_PROVIDER_SITE_OTHER): Payer: No Typology Code available for payment source

## 2021-03-16 ENCOUNTER — Telehealth: Payer: Self-pay | Admitting: Internal Medicine

## 2021-03-16 ENCOUNTER — Encounter: Payer: Self-pay | Admitting: Internal Medicine

## 2021-03-16 DIAGNOSIS — R051 Acute cough: Secondary | ICD-10-CM

## 2021-03-16 MED ORDER — ALBUTEROL SULFATE HFA 108 (90 BASE) MCG/ACT IN AERS
2.0000 | INHALATION_SPRAY | Freq: Four times a day (QID) | RESPIRATORY_TRACT | 0 refills | Status: DC | PRN
Start: 2021-03-16 — End: 2021-03-30

## 2021-03-16 NOTE — Telephone Encounter (Signed)
Left voicemail asking for callback

## 2021-03-16 NOTE — Telephone Encounter (Signed)
You for patient calling in with dry nonproductive cough that started last Tuesday.  Denies any fevers, chills, shortness of breath, sputum, or sick contact.  Had a small fever last Saturday but this was after his COVID and flu vaccine which she did on Friday.  Is able to talk in full sentences.  Did find some relief using his family's nebulizer and albuterol.    Without fever, chills, or productive cough possible this could be due to noninfectious agent.  Given albuterol to help with his cough and normal saline to help with possible sinus issues.  Asked patient to check back in on Wednesday to monitor course.  Pending response may need to talk about further treatment

## 2021-03-18 ENCOUNTER — Telehealth (INDEPENDENT_AMBULATORY_CARE_PROVIDER_SITE_OTHER): Payer: Self-pay

## 2021-03-18 ENCOUNTER — Encounter (INDEPENDENT_AMBULATORY_CARE_PROVIDER_SITE_OTHER): Payer: No Typology Code available for payment source

## 2021-03-18 NOTE — Telephone Encounter (Signed)
Called and left vm regarding NO SHOW; call back to reschedule.

## 2021-03-27 ENCOUNTER — Ambulatory Visit (INDEPENDENT_AMBULATORY_CARE_PROVIDER_SITE_OTHER): Payer: No Typology Code available for payment source

## 2021-03-27 ENCOUNTER — Encounter (INDEPENDENT_AMBULATORY_CARE_PROVIDER_SITE_OTHER): Payer: Self-pay | Admitting: Sleep Medicine

## 2021-03-27 DIAGNOSIS — G4733 Obstructive sleep apnea (adult) (pediatric): Secondary | ICD-10-CM

## 2021-03-27 NOTE — Progress Notes (Signed)
SLEEP CENTER    HRT [place]  [facility] [place] OFF[facility] -CARDIOLOGY  [address]  [place] [address]  Dept: [phone]  Dept Fax: [phone]     HOME SLEEP TESTING Device Set Up  Set-up Date: March 27, 2021  Set up By: Park Pope    Patient NAME: Guy Martinez   DOB:  10/07/1970  MRN:  [id number]    HST Unit #:   [id number]  HST Scheduled Return Date: 03/30/21      All questions and concerns addressed.  Written and verbal instructions provided.     SIGNED:    Park Pope

## 2021-03-29 ENCOUNTER — Encounter (INDEPENDENT_AMBULATORY_CARE_PROVIDER_SITE_OTHER): Payer: Self-pay | Admitting: Sleep Medicine

## 2021-03-30 ENCOUNTER — Ambulatory Visit (INDEPENDENT_AMBULATORY_CARE_PROVIDER_SITE_OTHER): Payer: No Typology Code available for payment source | Admitting: Cardiovascular Disease

## 2021-03-30 ENCOUNTER — Encounter (INDEPENDENT_AMBULATORY_CARE_PROVIDER_SITE_OTHER): Payer: Self-pay | Admitting: Cardiovascular Disease

## 2021-03-30 VITALS — BP 130/70 | HR 80 | Ht 71.0 in | Wt 288.0 lb

## 2021-03-30 DIAGNOSIS — Z8249 Family history of ischemic heart disease and other diseases of the circulatory system: Secondary | ICD-10-CM

## 2021-03-30 DIAGNOSIS — G4733 Obstructive sleep apnea (adult) (pediatric): Secondary | ICD-10-CM

## 2021-03-30 DIAGNOSIS — E7800 Pure hypercholesterolemia, unspecified: Secondary | ICD-10-CM

## 2021-03-30 DIAGNOSIS — I10 Essential (primary) hypertension: Secondary | ICD-10-CM

## 2021-03-30 NOTE — Patient Instructions (Signed)
Weight loss:    Ideal Protein  Intermittent fasting  Can meet with a dietician / nutritionist    Scherry Ran RDN, CSOWM  Her practice is currently virtual     Contact info:  [email or web address]  Phone: [phone]  Fax: [phone]    Exercise:    150 minutes per week

## 2021-03-30 NOTE — Progress Notes (Signed)
[facility] CARDIOLOGY OFFICE PROGRESS NOTE    HRT [facility] OFFICE -CARDIOLOGY  [address]  [place] [address]  Dept: [phone]  Dept Fax: [phone]       Patient Name: Guy Martinez    Date of Visit:  March 30, 2021  Date of Birth: 1970-07-13  AGE: 50 y.o.  Medical Record #: [id number]  Requesting Physician: Sarina Ill, MD      CHIEF COMPLAINT: Follow-up      HISTORY OF PRESENT ILLNESS:    He is a pleasant 50 y.o. male who presents today for a follow-up visit.     No acute complaints today.  He has been staying busy with work.  He has had weight gain.  Not exercising routinely.  No chest pain or dyspnea.  No palpitations.  Awaiting the results of his sleep study.      PAST MEDICAL HISTORY: He has a past medical history of H/O Coronary CT Angiogram (10/2010), H/O echocardiogram (10/2014), H/O stress echocardiogram (02/2009), H/O treadmill stress test (01/2008, 05/2010, 11/2015, 12/12/15), Hyperlipidemia, Hypertension, Obesity, and Seasonal allergic rhinitis. 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    ALLERGIES: No Known Allergies    MEDICATIONS:     Current Outpatient Medications:     atorvastatin (LIPITOR) 20 MG tablet, Take 1 tablet (20 mg total) by mouth daily, Disp: 90 tablet, Rfl: 3    lisinopril (ZESTRIL) 40 MG tablet, Take 1 tablet (40 mg total) by mouth daily, Disp: 90 tablet, Rfl: 2       FAMILY HISTORY: family history includes Appendicitis in his son; Asthma in his son; COPD in his paternal grandfather; Cancer (age of onset: 68) in his father; Diabetes in his father, paternal grandfather, and paternal grandparent; Diverticulitis in his mother; Emphysema in his paternal grandparent; Heart attack in his paternal grandfather; Heart attack (age of onset: 51) in his father; Heart disease in his father and paternal grandparent; Hyperlipidemia in his father; Hypertension in his father; Myocardial Infarction in his  father; No known problems in his brother, paternal grandmother, sister, son, and son; Pancreatic cancer (age of onset: 44) in his paternal uncle; Stroke (age of onset: 26) in his father.    SOCIAL HISTORY: He reports that he quit smoking about 10 years ago. His smoking use included cigarettes. He quit smokeless tobacco use about 12 years ago.  His smokeless tobacco use included snuff. He reports current alcohol use of about 15.0 - 20.0 standard drinks per week. He reports that he does not use drugs.    PHYSICAL EXAMINATION    Visit Vitals  BP 130/70 (BP Site: Right arm, Patient Position: Sitting, Cuff Size: Large)   Pulse 80   Ht 1.803 m (5\' 11" )   Wt 130.6 kg (288 lb)   BMI 40.17 kg/m       GENERAL: Patient is in no acute distress   HEENT: No scleral icterus or conjunctival pallor, moist mucous membranes   NECK: No jugular venous distention or thyromegaly, normal carotid upstrokes without bruits   CARDIAC: Regular rate and rhythm, with normal S1 and S2, and no murmurs, rubs, or gallops   CHEST: Clear to auscultation bilaterally, normal respiratory effort  ABDOMEN: Soft, nontender, non-distended, normoactive bowel sounds   EXTREMITIES: No clubbing, cyanosis, or edema, 2+ DP  SKIN: No rash or jaundice   NEUROLOGIC: Alert and oriented to time, place and person, normal mood and affect  MUSCULOSKELETAL: Normal muscle strength and tone.          LABS:   Lab Results   Component Value Date    WBC 5.8 10/10/2020    HGB 14.2 10/10/2020    HCT 41.4 10/10/2020    PLT 213 10/10/2020     Lab Results   Component Value Date    GLU 100 (H) 12/05/2020    BUN 20 12/05/2020    CREAT 1.23 12/05/2020    NA 138 12/05/2020    K 4.4 12/05/2020    CL 107 12/05/2020    CO2 20 12/05/2020    AST 26 12/05/2020    ALT 50 (H) 12/05/2020     Lab Results   Component Value Date    MG 2.2 08/10/2017    TSH 2.87 10/10/2020    HGBA1C 5.1 12/05/2020    BNP 15.9 08/10/2017     Lab Results   Component Value Date    CHOL 153 12/05/2020    TRIG 173 (H)  12/05/2020    HDL 33 (L) 12/05/2020    LDL 93 12/05/2020           Most recent echo and nuclear study reviewed.      IMPRESSION:   Guy Martinez is a 50 y.o. 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 CAC 126, 92nd percentile 12/2019.  Hypertension-well controlled.  Hyperlipidemia-not on a statin.  Family history of coronary disease-father had his first event in his 74s.  Normal ETT in 2021.  Overweight.      RECOMMENDATIONS:    Continue antihypertensive and statin therapy  Discussed weight loss and routine exercise  Repeat ETT to make sure it is safe for the patient to perform high intensity exercise  Follow-up in 6 to 12 months                                                   Orders Placed This Encounter   Procedures    Exercise Stress Test (Office Treadmill)    Office Visit (HRT [place])       No orders of the defined types were placed in this encounter.      SIGNED:    Belinda Fisher, MD          This note was generated by the Dragon speech recognition and may contain errors or omissions not intended by the user. Grammatical errors, random word insertions, deletions, pronoun errors, and incomplete sentences are occasional consequences of this technology due to software limitations. Not all errors are caught or corrected. If there are questions or concerns about the content of this note or information contained within the body of this dictation, they should be addressed directly with the author for clarification.

## 2021-03-31 ENCOUNTER — Encounter (INDEPENDENT_AMBULATORY_CARE_PROVIDER_SITE_OTHER): Payer: Self-pay | Admitting: Critical Care Medicine

## 2021-04-01 ENCOUNTER — Ambulatory Visit: Payer: No Typology Code available for payment source | Admitting: Internal Medicine

## 2021-04-02 NOTE — Progress Notes (Signed)
[facility] SLEEP OFF[facility] PROGRESS NOTE    HRT [place]  [facility] [place] OFF[facility] -CARDIOLOGY  [address]  [place] [address]  Dept: [phone]  Dept Fax: [phone]       Patient Name: Guy Martinez    Date of Visit:  April 03, 2021  Date of Birth: Guy Martinez  AGE: 50 y.o.  Medical Record #: [id number]      CHIEF COMPLAINT:    Chief Complaint   Patient presents with    Follow-up     Sleep apnea          HISTORY OF PRESENT ILLNESS:    50 year old man known to me, returns for follow-up and test result review.  Last seen by me on 10/29/2020.    To briefly review there is a history of hypertension followed by [facility] cardiology, family history of cardiac disease, with current complaint of loud snoring and nodding off.      Ultimately sent for and completed an HST test on 03/27/2021.  This reveals overall AHI of 27.3, markedly supine dominant, average O2 94% with O2 nadir 66%.  Time sustained less than 88% set to 17 minutes.  He does feel this is a relatively typical night of sleep for him.    PAST MEDICAL HISTORY: He has a past medical history of H/O Coronary CT Angiogram (10/2010), H/O echocardiogram (10/2014), H/O stress echocardiogram (02/2009), H/O treadmill stress test (01/2008, 05/2010, 11/2015, 12/12/15), Hyperlipidemia, Hypertension, Obesity, and Seasonal allergic rhinitis. 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    ALLERGIES: No Known Allergies    MEDICATIONS:     Current Outpatient Medications:     atorvastatin (LIPITOR) 20 MG tablet, Take 1 tablet (20 mg total) by mouth daily, Disp: 90 tablet, Rfl: 3    lisinopril (ZESTRIL) 40 MG tablet, Take 1 tablet (40 mg total) by mouth daily, Disp: 90 tablet, Rfl: 2    FAMILY HISTORY: family history includes Appendicitis in his son; Asthma in his son; COPD in his paternal grandfather; Cancer (age of onset: 44) in his father; Diabetes in his father, paternal grandfather, and paternal grandparent;  Diverticulitis in his mother; Emphysema in his paternal grandparent; Heart attack in his paternal grandfather; Heart attack (age of onset: 73) in his father; Heart disease in his father and paternal grandparent; Hyperlipidemia in his father; Hypertension in his father; Myocardial Infarction in his father; No known problems in his brother, paternal grandmother, sister, son, and son; Pancreatic cancer (age of onset: 18) in his paternal uncle; Stroke (age of onset: 77) in his father.    SOCIAL HISTORY: He reports that he quit smoking about 10 years ago. His smoking use included cigarettes. He quit smokeless tobacco use about 12 years ago.  His smokeless tobacco use included snuff. He reports current alcohol use of about 15.0 - 20.0 standard drinks per week. He reports that he does not use drugs.      PHYSICAL EXAM:    Visit Vitals  BP 134/74 (BP Site: Left arm, Patient Position: Sitting, Cuff Size: Large)   Pulse 67   Ht 1.803 m (5\' 11" )   Wt 130.6 kg (288 lb)   BMI 40.17 kg/m        Constitutional: Cooperative, alert and oriented,well developed, well nourished, in no acute distress.,   Head: Normocephalic, normal hair pattern, facemask  Eyes: conjunctivae and lids normal   Chest: Normal respiration without extremus   Neurological: No gross motor or sensory deficits noted  Psych: affect appropriate, oriented to time, person and place.        LABS:   Lab Results   Component Value Date    AST 26 12/05/2020    ALT 50 (H) 12/05/2020     No results found for: CBC, IRON, TIBC, FERRITIN  Lab Results   Component Value Date    TSH 2.87 10/10/2020         IMPRESSION:   [name] is a 50 y.o. male with the following problems:    New diagnosis moderate sleep apnea by HST on 03/27/2021, AHI 27.3, markedly supine dominant, O2 nadir 66%  Sustained hypoxemia 17 minutes less than 88%  Snoring, daytime fatigue, unintentional dozing  Hypertension  Hyperlipidemia  BMI 40.1      RECOMMENDATIONS:    New Diagnosis OSA, will start PAP therapy  at this time.    -I reviewed sleep study data in detail with the patient. The negative effects of sleep-disordered breathing were also discussed with the patient.   - General practitioners and Cardiology notes were reviewed. Pathophysiology of sleep apnea as it relates to the cardiovascular system was reviewed. Treating sleep apnea when moderate to severe in particular can help reduce future cardiovascular risks, including uncontrolled hypertension, more frequent episodes of atrial fibrillation, MI or CVA .     - PAP device and other alternative treatment options including positional therapy (sleep on the side), breathe right nasal strips, the use of an oral appliance (mandibular advancement device) and ENT surgery/Hypoglossal nerve stim were discussed with the patient in detail.  Risks and benefits reviewed.  - We will set the patient up with APAP and custom-fit with mask.   CMN for new setup is completed and will be sent to DME: [facility]  - Encouraged patient to use PAP machine daily for a minimum of 4 hours during sleep.  Discussed equipment maintenance and replacement schedules.   - A regular sleep-wake cycle with 7-9 hours of sleep per night along with good sleep hygiene were recommended.   - The importance of weight control through diet and exercise was emphasized, specifically BMI<25.  Weight loss has a positive impact on sleep apnea.   -Patient was strongly encouraged not to drive or operate machine if at all sleepy or fatigued  -Patient will be seen in follow up 8 weeks after setup, but was strongly encouraged to contact [facility] Medicine if he has any questions or concerns before that visit.                                                 Orders Placed This Encounter   Procedures    C-Pap (DME)    Sleep APP Visit (HRT [place])    DME Set Up Visit (Hrt [place])         No orders of the defined types were placed in this encounter.        SIGNED:    Shary Key, MD        This note was generated  by the Dragon speech recognition and may contain errors or omissions not intended by the user. Grammatical errors, random word insertions, deletions, pronoun errors, and incomplete sentences are occasional consequences of this technology due to software limitations. Not all errors are caught or corrected. If there are questions or concerns about the content  of this note or information contained within the body of this dictation, they should be addressed directly with the author for clarification.

## 2021-04-03 ENCOUNTER — Ambulatory Visit (INDEPENDENT_AMBULATORY_CARE_PROVIDER_SITE_OTHER): Payer: No Typology Code available for payment source | Admitting: Sleep Medicine

## 2021-04-03 ENCOUNTER — Telehealth: Payer: No Typology Code available for payment source | Admitting: Internal Medicine

## 2021-04-03 ENCOUNTER — Encounter (INDEPENDENT_AMBULATORY_CARE_PROVIDER_SITE_OTHER): Payer: Self-pay | Admitting: Sleep Medicine

## 2021-04-03 VITALS — BP 134/74 | HR 67 | Ht 71.0 in | Wt 288.0 lb

## 2021-04-03 DIAGNOSIS — G4733 Obstructive sleep apnea (adult) (pediatric): Secondary | ICD-10-CM

## 2021-04-03 DIAGNOSIS — Z0000 Encounter for general adult medical examination without abnormal findings: Secondary | ICD-10-CM

## 2021-04-03 NOTE — Progress Notes (Signed)
Follow-up video call to discuss health maintenance issues.    Patient has not followed up with GI yet for colonoscopy.  Will give new referral to [facility].    Sleep study -currently following with [facility] and is due to pick up his new cpap    Cardiology -pending exercise stress test.  Following with [facility].    History of tobacco use = low-dose CT still pending.  Order to be emailed to patient and given information with ischemic schedule    Stress, dietary, exercise difficulty -patient interested in meeting with Velna Hatchet    Patient reports by [place] booster from [employer] and flu shot on 03/06/2021.

## 2021-04-06 ENCOUNTER — Encounter (INDEPENDENT_AMBULATORY_CARE_PROVIDER_SITE_OTHER): Payer: No Typology Code available for payment source

## 2021-04-07 ENCOUNTER — Encounter (INDEPENDENT_AMBULATORY_CARE_PROVIDER_SITE_OTHER): Payer: Self-pay | Admitting: Sleep Medicine

## 2021-04-10 ENCOUNTER — Encounter: Payer: Self-pay | Admitting: Internal Medicine

## 2021-04-10 ENCOUNTER — Encounter (INDEPENDENT_AMBULATORY_CARE_PROVIDER_SITE_OTHER): Payer: No Typology Code available for payment source

## 2021-04-10 DIAGNOSIS — G4733 Obstructive sleep apnea (adult) (pediatric): Secondary | ICD-10-CM | POA: Insufficient documentation

## 2021-04-13 ENCOUNTER — Telehealth: Payer: Self-pay | Admitting: Internal Medicine

## 2021-04-13 ENCOUNTER — Ambulatory Visit (INDEPENDENT_AMBULATORY_CARE_PROVIDER_SITE_OTHER): Payer: No Typology Code available for payment source

## 2021-04-13 ENCOUNTER — Encounter: Payer: Self-pay | Admitting: Internal Medicine

## 2021-04-13 ENCOUNTER — Other Ambulatory Visit (INDEPENDENT_AMBULATORY_CARE_PROVIDER_SITE_OTHER): Payer: Self-pay | Admitting: Cardiology

## 2021-04-13 ENCOUNTER — Other Ambulatory Visit (INDEPENDENT_AMBULATORY_CARE_PROVIDER_SITE_OTHER): Payer: Self-pay

## 2021-04-13 ENCOUNTER — Encounter (INDEPENDENT_AMBULATORY_CARE_PROVIDER_SITE_OTHER): Payer: Self-pay

## 2021-04-13 ENCOUNTER — Telehealth (INDEPENDENT_AMBULATORY_CARE_PROVIDER_SITE_OTHER): Payer: Self-pay

## 2021-04-13 DIAGNOSIS — G4733 Obstructive sleep apnea (adult) (pediatric): Secondary | ICD-10-CM

## 2021-04-13 DIAGNOSIS — I10 Essential (primary) hypertension: Secondary | ICD-10-CM

## 2021-04-13 MED ORDER — LISINOPRIL 40 MG PO TABS
40.0000 mg | ORAL_TABLET | Freq: Every day | ORAL | 3 refills | Status: DC
Start: 2021-04-13 — End: 2021-04-13

## 2021-04-13 MED ORDER — LISINOPRIL 40 MG PO TABS
40.0000 mg | ORAL_TABLET | Freq: Every day | ORAL | 3 refills | Status: DC
Start: 2021-04-13 — End: 2022-02-18

## 2021-04-13 NOTE — Telephone Encounter (Signed)
Refilled lisinopril per pt's request. Confirmed on the phone.

## 2021-04-13 NOTE — Progress Notes (Signed)
Patient arrived today to receive his CPAP device as scheduled. There was some misunderstanding as the patient believed he was to receive a S11 device. He said Dr Monia Pouch and the person he spoke to on the phone acknowledged it was a S11 he was getting. 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 Staff members confirmed that they do not mention machine type at time of scheduling.   I apologized to the patient and explained that we only have S10's in stock, He can take the S10 today but if he does we will not be able to exchange it in the future. He could also wait and see when we get another shipment of S11's but we do know when this will be. Or alternatively we could send him to another DME providers but their wait times are also 8-12 weeks for a S11.   Patient chose to take the S10 today.

## 2021-04-13 NOTE — Telephone Encounter (Signed)
Patient called requesting a refill on his lisinopril and stated that his prefer pharmacy is the [facility] in [place].

## 2021-04-13 NOTE — Progress Notes (Signed)
SLEEP CENTER    [facility] -CARDIOLOGY  [address]  [place] [address]  Dept: [phone]  Dept Fax: [phone]     Durable Medical Equipment (DME) Device Set up appointment  Date of Visit:  April 13, 2021     Patient Name: Guy Martinez  DOB:   Jan 04, 1971   Age:   50 y.o.   MR:   [id number]     Device Information:  Machine Brand:   ResMed AirSense  Machine Type:   Auto CPAP  Pressure Settings:  4-16 cm  H2O  Machine Serial #:   [id number]  Humidifier Serial #:   [id number]    Mask Information:  Mask Style:    DWFF  Mask Brand:   Respironics  Mask Size:    Small  Frame Size:   Standard (Med)  Headgear Size:  Standard (Med)    Clinical Check List:  Ladona Mow / Airview Setup:  [x]  Yes / []  No  Deliver Ticket Attached:   [x]  Yes / []  No  Auto Supply:    []  Yes / [x]  No    Delivery ticket attached:  [x]  Yes / []  No / []  Not on auto-supply  OOP Collected:   [x]  Yes / []  No   Follow-up appt scheduled:  [x]  Yes / []  No    Appointment Notes:  During this visit patient fitted with F20 medium, F20 TOUCH medium, EVORA FULL s/m and DWFF MED small masks.    Patient was educated on positive airway pressure (PAP) operation and maintenance. Reviewed pressure operation, humidifier settings as well as ramp option. We discussed the features of the PAP device and how to make adjustments to therapy when necessary.     The patient was educated on the techniques of how best to get acclimated to PAP therapy. We advised the patient to use therapy while awake tonight and as needed moving forward to allow for better acclimatation.    The patient was informed of the 30 day program for the mask and the two-year warranty for the machine.  The patient was also informed of the insurance coverage type as being rental and that compliance is required per insurance requirements. We also reviewed cleaning instructions and recommended time frames for replacement of PAP supplies.      The patient  received verbal and written instructions as to the above.   All questions / concerns answered and addressed to the patient's satisfaction.       SIGNED:    Park Pope

## 2021-04-13 NOTE — Telephone Encounter (Signed)
Opened for review. 

## 2021-04-15 ENCOUNTER — Other Ambulatory Visit (INDEPENDENT_AMBULATORY_CARE_PROVIDER_SITE_OTHER): Payer: Self-pay

## 2021-04-16 ENCOUNTER — Encounter: Payer: Self-pay | Admitting: Physician Assistant

## 2021-04-16 ENCOUNTER — Telehealth (INDEPENDENT_AMBULATORY_CARE_PROVIDER_SITE_OTHER): Payer: No Typology Code available for payment source | Admitting: Physician Assistant

## 2021-04-16 DIAGNOSIS — Z7182 Exercise counseling: Secondary | ICD-10-CM

## 2021-04-16 DIAGNOSIS — F439 Reaction to severe stress, unspecified: Secondary | ICD-10-CM

## 2021-04-16 DIAGNOSIS — Z6841 Body Mass Index (BMI) 40.0 and over, adult: Secondary | ICD-10-CM

## 2021-04-16 DIAGNOSIS — Z713 Dietary counseling and surveillance: Secondary | ICD-10-CM

## 2021-04-16 NOTE — Progress Notes (Signed)
Verbal consent has been obtained from the patient to conduct a televisit with video and audio: YES.    Patient: Guy Martinez   Age: 50 y.o.     HPI  Patient presents today for initial lifestyle medicine focused visit. Patient's medical history significant for HLD, HTN, OSA, and BMI 40.2. Labs earlier this year signficant for borderline glucose (100), elevated ALT, trigs 173, and low HDL 33. Of note, GFR, AST, alk phos, A1c, and Hgb were WNL.    Wt Hx: Gained about 13 lbs since last year per [employer]. Visceral 20, 36% body fat, 2160 BMR.    Lifestyle Assessment form: Patient rates current health 6/10. Pt felt like life had purpose or meaning only several days over the past 2 weeks. GAD2=1/6; PHQ2=1/6. Patient most wants to work on Exercise, [employer], and Purpose/Connection, but he also states Nutrition and Temperance as areas of concern. Pt wants to lose a lot of weight.     Nutrition - Pt wants help in this area. Eats 2-3 servings produce daily, and packaged/fast food more than half the days. He says "this has gone downhill. I want it, I eat it." Discussed ultra-processed vs whole foods, benefits of leafy greens, and limiting sat fat and added sugars. Discussed counting calories every day as stressful. Eats restaurant dinners, e.g. 8oz filet with salad. Discussed fish and veggies e.g. broccoli, asking for sauce on side and EVOO instead of butter.     Exercise - This is the #1 area where he wants help. None currently. He often drives by a gym where he has been interested in training. He would like exercise structure and a time he needs to show up to work out. He does not make time for walking. In past, has enjoyed skiing, rugby and team sports. Enjoys golf. Would like to play basketball with his kids. Discussed strength training, [employer] flyer, http://wilson.com/. Short events he thinks would work better. Discussed setting aside a specific exercise time block in AM, putting it on his calendar.     Water/Hydration - We calculated  100 oz/day need based on current weight, which is 12 oz/hour.     Sunshine and [employer] - He is outside with golfing. Does not take any vits; discussed D3 OTC for winter.    Temperance/Risky Substances - Does not smoke regularly, but has a cigarette sometimes while drinking socially. His environment is such that he does social drinking. He is not drinking at home. He has noted hangovers lead to worse hydration and eating choices. Every 4 months he takes a month and does not drink at all, "to be sure I can still do it." Discussed having a glass of water after 1st ETOH drink, savoring one glass of red wine instead of mixed drinks that go down faster.     Rest/Sleep - Got CPAP in the last week. Sleeps 6.5 hours, not tired in day. His snoring is really bad, so he has been on the couch. Discussed working toward target of 7 hours.     Spiritual Nourishment, Stress Mgmt and Social Connections - "Stress is an issue." He goes to office some days. He and wife are socially connecting with others now. His religious connections have "taken a backseat during pandemic;" he is interested in getting back to this for relaxation.     The following portions of the patient's history were reviewed and updated as appropriate: allergies, current medications, past family history, past medical history, past social history, past surgical history and  problem list. Most recent labs and office visit note were also reviewed.    Review of Systems  No acute complaints.      Objective:   Physical Exam  General: Patient appears comfortable and in no apparent distress.  Lungs: No audible wheezes. Able to speak in complete sentences without breathing difficulty.  Neuro: No hearing or speech deficits noted.  Psych: Alert and oriented x 3, pleasant and in good spirits.  MSK: Normal movements of head, neck, trunk, and BUE observed.      Assessment:     Encounter Diagnoses   Name Primary?    Dietary counseling Yes    Exercise counseling     Class 3 severe  obesity with body mass index (BMI) of 40.0 to 44.9 in adult, unspecified obesity type, unspecified whether serious comorbidity present     Stress     BMI 40.0-44.9, adult      Plan:      Procedures: No orders of the defined types were placed in this encounter.    In order to optimize health and improve chronic conditions, we will work on the following:    1.  Nutrition - I will share my Cholesterol, Carbs/protein/fat, and Meal Planning handouts, a Healthy Lunches tipsheet and High Fiber shopping list. For recipe ideas/resources, check out https://www.webster-tanner.com/. Take 600-1,000 IU Vit D3 once daily with a meal, at least through winter when you cannot get it from sunshine on your skin. Avoid ultra-processed/junk foods, focusing instead on whole foods (closer to how they came in nature). Include a serving of leafy greens daily. Limit saturated fats and added sugars. By following these guidelines, we will not need to count calories unless weight is not changing over time. For restaurant dinners, choose lean protein like fish plus veggies e.g. broccoli, asking for sauce on side and olive oil instead of butter when possible.     2. Exercise - Start adding some exercise snacks (5-15 minutes) into your week. Block AM exercise time on your weekly schedule to allow space/time and provide structure. Strength training would be the biggest bang for your time buck. You could stop by the gym where are interested in training, to get more information on what they offer. Continue to golf. Capture opportunities to be active socially, e.g. playing basketball with your family. I will share the [facility] flyer which you could use for free to join an exercise class on demand. I also recommend [employer] for strength ideas.     3. Water/Hydration - We calculated 100 oz/day need based on current weight, which is 12 oz/hour. Teas, herb teas, sparkling water, seltzer water, decaf and 1/2 caff coffee all count toward that.    4. Temperance - We  want to limit alcohol, since calories are empty and since drinking affects hydration, sleep, and eating choices. Have a glass of water after an alcoholic drink, and savor one glass of red wine over time with a meal instead of having several mixed drinks.     5. Sleep - Continue using CPAP nightly, and work toward nightly sleep goal of 7 hours.     Follow up with me in 6-8 weeks, scheduled Jan 12.  Follow up with PCP as planned.    Total Provider Time Today: 55 minutes including records review and charting. Today's visit was preventative per USPSTF guidelines for prevention of and prevention of progression of chronic disease.    Velna Hatchet Shavonn Convey, PA-C  [place] [phone]

## 2021-04-20 ENCOUNTER — Other Ambulatory Visit (INDEPENDENT_AMBULATORY_CARE_PROVIDER_SITE_OTHER): Payer: Self-pay | Admitting: Internal Medicine

## 2021-04-20 NOTE — Progress Notes (Signed)
Ottie,    Dr Selena Batten is out of office this week and I am covering for him.    Your CT lung report is in and remains unchanged from prior reports. There is a stable 4mm nodule in there compared to your scan in 2021, no change and they are suggesting a repeat annually going forward. I will make a notation for Dr Selena Batten as well.    In Good Health,    Earnest Bailey , MD 4:17 PM 04/20/2021

## 2021-04-22 ENCOUNTER — Other Ambulatory Visit (INDEPENDENT_AMBULATORY_CARE_PROVIDER_SITE_OTHER): Payer: Self-pay | Admitting: Sleep Medicine

## 2021-04-22 ENCOUNTER — Ambulatory Visit (INDEPENDENT_AMBULATORY_CARE_PROVIDER_SITE_OTHER): Payer: No Typology Code available for payment source

## 2021-04-22 ENCOUNTER — Encounter (INDEPENDENT_AMBULATORY_CARE_PROVIDER_SITE_OTHER): Payer: Self-pay | Admitting: Sleep Medicine

## 2021-04-22 DIAGNOSIS — G4733 Obstructive sleep apnea (adult) (pediatric): Secondary | ICD-10-CM

## 2021-04-22 DIAGNOSIS — Z9989 Dependence on other enabling machines and devices: Secondary | ICD-10-CM

## 2021-04-22 MED ORDER — GUAIFENESIN-CODEINE 100-10 MG/5ML PO SYRP
5.0000 mL | ORAL_SOLUTION | Freq: Three times a day (TID) | ORAL | 0 refills | Status: DC | PRN
Start: 2021-04-22 — End: 2021-05-15

## 2021-04-22 MED ORDER — BENZONATATE 200 MG PO CAPS
200.0000 mg | ORAL_CAPSULE | Freq: Three times a day (TID) | ORAL | 0 refills | Status: DC | PRN
Start: 2021-04-22 — End: 2021-05-15

## 2021-04-22 NOTE — Progress Notes (Signed)
Spoke with patient to discuss sxs. Reports that cough started on Saturday or Sunday around same time he started using CPAP machine. Reports cough typically only occurs when wearing the CPAP. Took Coricidin last night which did help to improve sxs at night. Denies any sore throat, nasal congestion, SOB, or fever. Reports he is at the sleep center currently and will discuss cough with the provider as he believes it may be related to the CPAP.     Instructed patient to send MyChart message with instructions from sleep center after reevaluation.     Informed patient I would relay information to Dr. Marquis Lunch (covering for Dr. Selena Batten) for further recommendations if Sleep Center doctor does not believe cough is related to CPAP.

## 2021-04-22 NOTE — Progress Notes (Signed)
SLEEP CENTER    [facility] -CARDIOLOGY  [address]  [place] [address]  Dept: [phone]  Dept Fax: [phone]       Durable Medical Equipment (DME) Visit  Date of Visit: April 22, 2021    Patient NAME: Guy Martinez   DOB:  05-20-1971  Age:  50 y.o.  MRN:  [id number]    Appointment Notes:  The patient arrived to have the pressure adjusted on his PAP device.  Patient complained of difficulty adjusting to high pressures.  CR shows 13-14cm average with AHI@0 .7.  Adjusted APAP max to 12cm.  Patient stated he developed a cough; maybe related to PAP or acquired a cold.  Increased humidity in the meantime.    SIGNED:    Park Pope

## 2021-04-22 NOTE — Addendum Note (Signed)
Addended by: Earnest Bailey F on: 04/22/2021 11:01 AM     Modules accepted: Orders

## 2021-05-04 ENCOUNTER — Encounter: Payer: Self-pay | Admitting: Internal Medicine

## 2021-05-04 DIAGNOSIS — R911 Solitary pulmonary nodule: Secondary | ICD-10-CM | POA: Insufficient documentation

## 2021-05-07 ENCOUNTER — Telehealth: Payer: Self-pay | Admitting: Internal Medicine

## 2021-05-07 ENCOUNTER — Encounter: Payer: Self-pay | Admitting: Internal Medicine

## 2021-05-11 ENCOUNTER — Encounter: Payer: Self-pay | Admitting: Internal Medicine

## 2021-05-12 ENCOUNTER — Telehealth: Payer: Self-pay | Admitting: Internal Medicine

## 2021-05-12 NOTE — Telephone Encounter (Signed)
Spoke with pt who's wife is interested in our program. Start with video consult and move forward from there. Aware my next intro visit is not until 1/11 and physical 3/3. May just start with a 30 min visit.

## 2021-05-13 NOTE — Telephone Encounter (Signed)
Spoke with patient who's wife is currently hospitalized. Inquired about having his wife join our program. Asked that he follow up after her discharge.

## 2021-05-15 ENCOUNTER — Ambulatory Visit (INDEPENDENT_AMBULATORY_CARE_PROVIDER_SITE_OTHER): Payer: No Typology Code available for payment source | Admitting: Cardiovascular Disease

## 2021-05-15 DIAGNOSIS — I10 Essential (primary) hypertension: Secondary | ICD-10-CM

## 2021-05-15 NOTE — Procedures (Signed)
EXERCISE STRESS TEST    [facility] OFF[facility] -CARDIOLOGY  [address]  [place] [address]  Dept: [phone]  Dept Fax: [phone]     Patient: Guy Martinez  Sex: Male   DOB: September 06, 1970 (50 y.o.)  MRN:  [id number]     Test Date:  05/15/2021       Interpretation Date: 05/15/2021    Referring Physician: Sarina Ill, MD     CLINICIAN: DFreudenthal  CT  SUPERVISING PROVIDER: Berdine Dance, MD, [facility]    MEDICATIONS:  Patient has a current medication list which includes the following prescription(s): lisinopril - Take 1 tablet (40 mg) by mouth daily.    INDICATION:  (Hypertension)    GRADED ECG EXERCISE TEST:    ----- Protocol and Exercise Time -----   Protocol Bruce   Exercise Time (min) 09:08   ----- Heart Rate -----   Resting HR 78 bpm   Maximum HR 163 bpm   METS 10.0 METS   Percent Maximum Predicted HR 96 %    -----Blood Pressure -----   Resting BP 148/41mmHg   Maximum BP 210/86 mmHg     Patient Symptoms: None    Reason for end: Fatigue    Resting ECG: Normal sinus rhythm  ST Changes: No remarkable EKG changes  Stress ECG: No ischemic changes    Arrhythmias: Rare PVC    INTERPRETATION:  Borderline hypertension response blood pressure response to exercise.  Exercise capacity average for age and gender.  No chest pain or ischemic ECG changes with exercise.    CONCLUSIONS:  Normal maximal exercise treadmill test with no clinical or ECG evidence of ischemia.  Results and recommendations conveyed to the patient.    INTERPRETED BY: Belinda Fisher, MD

## 2021-05-20 ENCOUNTER — Encounter: Payer: Self-pay | Admitting: Internal Medicine

## 2021-05-20 DIAGNOSIS — R051 Acute cough: Secondary | ICD-10-CM

## 2021-05-20 NOTE — Progress Notes (Signed)
Called and spoke with Mr. Guy Martinez concerning his URI.  He has been experiencing symptoms for the last week.  He has tested 2 consecutive days for Covid and it was negative.    He has a very bad unproductive dry cough, post nasal drip, fatigue and malaise.  He has difficulty sleeping due to waking up with coughing.     Suggested using Robitussin DM with plenty of water, Flonase and saline nasal rinses.    He stated that he had a previous prescription for Robitussin with Codeine.  Advised that it may help at night due to its sedating effect but to check the expiration date.    Patient agreed with the plan.  He will call back if no improvement.     Informed patient I would relay information to Sarina Ill, MD

## 2021-05-25 ENCOUNTER — Emergency Department: Payer: No Typology Code available for payment source

## 2021-05-25 ENCOUNTER — Emergency Department
Admission: EM | Admit: 2021-05-25 | Discharge: 2021-05-25 | Disposition: A | Discharge status: Home / Self Care | Payer: No Typology Code available for payment source | Attending: Emergency Medicine | Admitting: Emergency Medicine

## 2021-05-25 DIAGNOSIS — R030 Elevated blood-pressure reading, without diagnosis of hypertension: Secondary | ICD-10-CM

## 2021-05-25 DIAGNOSIS — I10 Essential (primary) hypertension: Secondary | ICD-10-CM | POA: Insufficient documentation

## 2021-05-25 DIAGNOSIS — J111 Influenza due to unidentified influenza virus with other respiratory manifestations: Secondary | ICD-10-CM | POA: Insufficient documentation

## 2021-05-25 DIAGNOSIS — R051 Acute cough: Secondary | ICD-10-CM

## 2021-05-25 DIAGNOSIS — Z87891 Personal history of nicotine dependence: Secondary | ICD-10-CM | POA: Insufficient documentation

## 2021-05-25 DIAGNOSIS — J40 Bronchitis, not specified as acute or chronic: Secondary | ICD-10-CM | POA: Insufficient documentation

## 2021-05-25 DIAGNOSIS — Z20822 Contact with and (suspected) exposure to covid-19: Secondary | ICD-10-CM | POA: Insufficient documentation

## 2021-05-25 LAB — COVID-19 (SARS-COV-2): SARS CoV-2: NEGATIVE

## 2021-05-25 MED ORDER — HYDROCODONE-ACETAMINOPHEN 5-325 MG PO TABS
1.0000 | ORAL_TABLET | Freq: Four times a day (QID) | ORAL | 0 refills | Status: AC | PRN
Start: 2021-05-25 — End: 2021-06-01

## 2021-05-25 MED ORDER — PREDNISONE 20 MG PO TABS
60.0000 mg | ORAL_TABLET | Freq: Once | ORAL | Status: AC
Start: 2021-05-25 — End: 2021-05-25
  Administered 2021-05-25: 09:00:00 60 mg via ORAL
  Filled 2021-05-25: qty 3

## 2021-05-25 MED ORDER — ALBUTEROL SULFATE (2.5 MG/3ML) 0.083% IN NEBU
2.5000 mg | INHALATION_SOLUTION | RESPIRATORY_TRACT | 0 refills | Status: AC | PRN
Start: 2021-05-25 — End: 2021-06-24

## 2021-05-25 MED ORDER — PREDNISONE 20 MG PO TABS
40.0000 mg | ORAL_TABLET | Freq: Every day | ORAL | 0 refills | Status: AC
Start: 2021-05-25 — End: 2021-05-30

## 2021-05-25 MED ORDER — ONDANSETRON 4 MG PO TBDP
4.0000 mg | ORAL_TABLET | Freq: Four times a day (QID) | ORAL | 0 refills | Status: DC | PRN
Start: 2021-05-25 — End: 2021-08-05

## 2021-05-25 MED ORDER — ALBUTEROL SULFATE HFA 108 (90 BASE) MCG/ACT IN AERS
2.0000 | INHALATION_SPRAY | Freq: Once | RESPIRATORY_TRACT | Status: AC
Start: 2021-05-25 — End: 2021-05-25
  Administered 2021-05-25: 09:00:00 2 via RESPIRATORY_TRACT
  Filled 2021-05-25: qty 8

## 2021-05-25 NOTE — ED Triage Notes (Signed)
for a week cough, low grade fever, Temp max 103. Son was Dx w/ Flu last week. Lightheadiness, dizziness, abdominal cramps, sore throat, SOB. No N/V, no body aches.

## 2021-05-25 NOTE — ED Provider Notes (Signed)
History     Chief Complaint   Patient presents with    Cough    Fever     50 y.o. with one week of flu like symptoms. His son was diagnosed with influenza about 1-2 weeks ago. Pt tried tesalon perles which he had at home and some muccinex which did not help him. He had fevers to 103 initially but fevers have now resolved. He has abd pain and chest pain only with coughing. Pain 4/10 when coughing. He feels like he cannot sleep at night. No n/v. Mild diarrhea.     The history is provided by the patient.   Cough  Associated symptoms: chills, fever, headaches, myalgias, rhinorrhea, shortness of breath and sore throat    Associated symptoms: no chest pain and no rash    Fever  Associated symptoms: chills, congestion, cough, diarrhea, headaches, myalgias, rhinorrhea and sore throat    Associated symptoms: no chest pain, no confusion, no dysuria, no nausea, no rash and no vomiting       Past Medical History:   Diagnosis Date    H/O Coronary CT Angiogram 10/2010    H/O echocardiogram 10/2014    H/O stress echocardiogram 02/2009    normal    H/O treadmill stress test 01/2008, 05/2010, 11/2015, 12/12/15    Hyperlipidemia     Hypertension     Obesity     Seasonal allergic rhinitis     Seasonal - Ragweed       Past Surgical History:   Procedure Laterality Date    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       Family History   Problem Relation Age of Onset    Diverticulitis Mother     Cancer Father 67        Prostate, Bladder    Heart attack Father 36        4 MI's    Stroke Father 65        2 strokes    Hypertension Father     Diabetes Father     Hyperlipidemia Father     Heart disease Father     Myocardial Infarction Father     No known problems Sister     No known problems Brother     No known problems Son     No known problems Paternal Grandmother     COPD Paternal Grandfather     Heart attack Paternal Grandfather     Diabetes Paternal Grandfather     Appendicitis Son     Asthma Son     No known problems Son     Pancreatic cancer Paternal  Uncle 65    Heart disease Paternal Grandparent     Diabetes Paternal Grandparent     Emphysema Paternal Grandparent         pulmonary       Social  Social History     Tobacco Use    Smoking status: Former     Types: Cigarettes     Quit date: 2012     Years since quitting: 10.9    Smokeless tobacco: Former     Types: Snuff     Quit date: 2010    Tobacco comments:     Social use. 5-7 cig/week   Vaping Use    Vaping Use: Never used   Substance Use Topics    Alcohol use: Not Currently     Alcohol/week: 15.0 - 20.0 standard drinks     Types: 15 - 20 Standard  drinks or equivalent per week     Comment: stopped 10 days ago    Drug use: Never       .     No Known Allergies    Home Medications       Med List Status: In Progress Set By: Colon Branch, RN at 05/25/2021  7:28 AM              atorvastatin (LIPITOR) 10 MG tablet     Take 10 mg by mouth daily     lisinopril (ZESTRIL) 40 MG tablet     Take 1 tablet (40 mg) by mouth daily             Review of Systems   Constitutional:  Positive for chills and fever.   HENT:  Positive for congestion, rhinorrhea and sore throat.    Respiratory:  Positive for cough and shortness of breath. Negative for chest tightness.    Cardiovascular:  Negative for chest pain and palpitations.   Gastrointestinal:  Positive for abdominal pain and diarrhea. Negative for nausea and vomiting.   Genitourinary:  Negative for dysuria and frequency.   Musculoskeletal:  Positive for myalgias. Negative for back pain.   Skin:  Negative for color change and rash.   Neurological:  Positive for dizziness and headaches.   Psychiatric/Behavioral:  Negative for confusion. The patient is not nervous/anxious.      Physical Exam    BP: (!) 161/94, Heart Rate: 92, Temp: 97.8 F (36.6 C), Resp Rate: 18, SpO2: 94 %, Weight: 129.3 kg    Physical Exam  Vitals and nursing note reviewed.   Constitutional:       Appearance: Normal appearance. He is well-developed.   HENT:      Head: Normocephalic and atraumatic.   Eyes:       General:         Right eye: No discharge.         Left eye: No discharge.      Conjunctiva/sclera: Conjunctivae normal.   Cardiovascular:      Rate and Rhythm: Normal rate and regular rhythm.      Heart sounds: Normal heart sounds.   Pulmonary:      Effort: Pulmonary effort is normal. No respiratory distress.      Breath sounds: Normal breath sounds. No wheezing or rales.   Abdominal:      General: There is no distension.      Palpations: Abdomen is soft.      Tenderness: There is no abdominal tenderness. There is no guarding or rebound.   Musculoskeletal:         General: No tenderness. Normal range of motion.      Cervical back: Normal range of motion and neck supple.      Right lower leg: No edema.      Left lower leg: No edema.   Skin:     General: Skin is warm and dry.   Neurological:      General: No focal deficit present.      Mental Status: He is alert and oriented to person, place, and time.      Cranial Nerves: No cranial nerve deficit.   Psychiatric:         Mood and Affect: Mood normal.         Behavior: Behavior normal.         Thought Content: Thought content normal.         Judgment: Judgment normal.  MDM and ED Course     ED Medication Orders (From admission, onward)      Start Ordered     Status Ordering Provider    05/25/21 [phone] 05/25/21 0828  predniSONE (DELTASONE) tablet 60 mg  Once        Route: Oral  Ordered Dose: 60 mg     Last MAR action: Given Simona Rocque H    05/25/21 0825 05/25/21 0824  albuterol sulfate HFA (PROVENTIL) inhaler 2 puff  RT - Once        Route: Inhalation  Ordered Dose: 2 puff     Last MAR action: Given Ellsie Violette H               MDM  Number of Diagnoses or Management Options  Acute cough  Bronchitis  Elevated blood pressure reading  Influenza-like illness  Diagnosis management comments: Ddx: influenza, pneumonia, covid  Plan: labs, cxr, obs    I, Nita Sells, M.D, have been the primary provider for Guy Martinez during this Emergency Dept visit.  Oxygen saturation by pulse  oximetry is 95%-100%, Normal.  Interventions: None Needed      8:30 AM  Pt feels like he is wheezing at times. He does have some wheezes on re-exam. Will try albuterol and steroids. Will f/u with pulmonology. Will return if he has worsening cough or wheezing or any concerns. No obvious pneumonia on xray. Pt well appearing. His son had flu last week and now "other kids getting sick, too".  Pt has some htn. Will give norco for cough suppression/pain, but we discussed risks of narcotics, especially with sleep apnea. We discussed side effects of steroids as well.       Amount and/or Complexity of Data Reviewed  Clinical lab tests: reviewed and ordered  Tests in the radiology section of CPT: ordered and reviewed  Review and summarize past medical records: yes (2021- ski accident)  Independent visualization of images, tracings, or specimens: yes (Cxr- no pneumonia, no ptx, no chf)    Patient Progress  Patient progress: improved             Radiology Results (24 Hour)       Procedure Component Value Units Date/Time    Chest AP Portable [[id number]] Collected: 05/25/21 0816    Order Status: Completed Updated: 05/25/21 0820    Narrative:      HISTORY: Cough     COMPARISON: Chest radiograph 08/10/2017. Chest CT 04/20/2021    FINDINGS:   CARDIAC SILHOUETTE: Normal size.     LUNGS/PLEURA: No consolidation or edema. No pleural effusion or  pneumothorax.      Impression:        No acute cardiopulmonary process.    Legrand Pitts, MD   05/25/2021 8:18 AM          Results       Procedure Component Value Units Date/Time    COVID-19 (SARS-CoV-2) (ID Now) [[id number]] Collected: 05/25/21 0731    Specimen: Nasopharyngeal Swab from Nasopharynx Updated: 05/25/21 0754     Purpose of COVID testing Diagnostic -[facility]     SARS-CoV-2 Specimen Source Nasopharyngeal     SARS CoV-2 Negative    Narrative:      o Collect and clearly label specimen type:  o Upper respiratory specimen: One Nasopharyngeal Dry Swab NO  Transport Media.  o Hand deliver to  laboratory ASAP  Diagnostic -[facility]    Rapid influenza A/B antigens [[id number]] Collected: 05/25/21 0731    Specimen: Nasopharyngeal  Swab from Nasal Aspirate Updated: 05/25/21 0754    Narrative:      ORDER#: [id number]                                    ORDERED BY: Nita Sells  SOURCE: Nasal Aspirate                               COLLECTED:  05/25/21 07:31  ANTIBIOTICS AT COLL.:                                RECEIVED :  05/25/21 07:38  Influenza Rapid Antigen A&B                FINAL       05/25/21 07:54  05/25/21   Negative for Influenza A and B             Reference Range: Negative                  Procedures    Clinical Impression & Disposition     Clinical Impression  Final diagnoses:   Acute cough   Influenza-like illness   Elevated blood pressure reading   Bronchitis        ED Disposition       ED Disposition   Discharge    Condition   --    Date/Time   Mon May 25, 2021  8:20 AM    Comment   Guy Martinez discharge to home/self care.    Condition at disposition: Stable                  Discharge Medication List as of 05/25/2021  8:28 AM        START taking these medications    Details   albuterol (PROVENTIL) (2.5 MG/3ML) 0.083% nebulizer solution Take 3 mLs (2.5 mg) by nebulization every 4 (four) hours as needed for Wheezing or Shortness of Breath (coughing), Starting Mon 05/25/2021, Until Wed 06/24/2021 at 2359, E-Rx      HYDROcodone-acetaminophen (NORCO) 5-325 MG per tablet Take 1 tablet by mouth every 6 (six) hours as needed for Pain, Starting Mon 05/25/2021, Until Mon 06/01/2021 at 2359, E-Rx      ondansetron (ZOFRAN-ODT) 4 MG disintegrating tablet Take 1 tablet (4 mg) by mouth every 6 (six) hours as needed for Nausea, Starting Mon 05/25/2021, E-Rx      predniSONE (DELTASONE) 20 MG tablet Take 2 tablets (40 mg) by mouth daily for 5 days, Starting Mon 05/25/2021, Until Sat 05/30/2021, E-Rx                         Leticia Clas, MD  05/25/21 1436

## 2021-05-25 NOTE — Discharge Instructions (Signed)
Sedating Medication (Edu)     You have been given a medicine or prescription for medicine that causes drowsiness (sleepiness) or dizziness.     While taking this medicine:  Do not drink alcohol or take any take any tranquilizers or sleeping pills.  Do not drive or use heavy machinery.   Do not ride a bicycle.  Do not perform jobs where you need to be alert.  Do not make important decisions or sign any legal documents.     After taking sedating medicine, you have a bigger risk of falling. You may need help with things you don’t normally need help with. Falls can result in serious injury.      Do the following when taking this medicine to prevent a fall:  If you get dizzy, sit or lie down at the first signs. Get up slowly.  Be careful walking and going up and down stairs. Always ask for help.  Wear your eye glasses.   Use your assistive devices such as a cane or walker.      Never give this medicine to others.     Keep this medicine out of reach of children.     Do not take or save old medicines. Throw them away when expired. To learn how to safely throw away medications, go to: https://www.fda.gov/Drugs/ResourcesForYou/Consumers/BuyingUsingMedicineSafely/EnsuringSafeUseofMedicine/SafeDisposalofMedicines/default.htm     Keep all medicines in a cool, dry place. Do not keep them in your bathroom medicine cabinet or in a cabinet above the stove.

## 2021-05-26 ENCOUNTER — Encounter: Payer: Self-pay | Admitting: Internal Medicine

## 2021-05-26 ENCOUNTER — Telehealth: Payer: Self-pay

## 2021-05-26 ENCOUNTER — Telehealth: Payer: Self-pay | Admitting: Internal Medicine

## 2021-05-26 MED ORDER — BUDESONIDE 0.5 MG/2ML IN SUSP
0.5000 mg | Freq: Every day | RESPIRATORY_TRACT | 0 refills | Status: DC
Start: 2021-05-26 — End: 2021-06-05

## 2021-05-26 MED ORDER — GUAIFENESIN-CODEINE 100-10 MG/5ML PO SYRP
5.0000 mL | ORAL_SOLUTION | Freq: Three times a day (TID) | ORAL | 0 refills | Status: DC | PRN
Start: 2021-05-26 — End: 2021-06-05

## 2021-05-26 NOTE — Addendum Note (Signed)
Addended by: Sarina Ill on: 05/26/2021 10:53 AM     Modules accepted: Orders

## 2021-05-26 NOTE — Progress Notes (Signed)
Follow-up phone call after ER visit yesterday.  Seen in the ER for coughing.  Work-up unremarkable including normal chest x-ray.  Discharged on prednisone and albuterol nebulizer.  Patient reports he is feeling slightly better today.  Recommended using albuterol nebulizer about every 6 hours until improving.  Added on budesonide nebulizer to be used twice daily.  Given Robitussin with codeine to help with cough.  Advised to monitor for oversedation.    Asked patient to call us back later this week to update me on his medical condition to make sure he is improving before the long weekend

## 2021-05-26 NOTE — Telephone Encounter (Signed)
error 

## 2021-05-26 NOTE — Telephone Encounter (Signed)
Left message for patient asking for call back in regards to upper respiratory infection and recent ER visit

## 2021-05-27 ENCOUNTER — Telehealth: Payer: Self-pay | Admitting: Internal Medicine

## 2021-05-27 ENCOUNTER — Encounter: Payer: Self-pay | Admitting: Internal Medicine

## 2021-05-27 DIAGNOSIS — R051 Acute cough: Secondary | ICD-10-CM

## 2021-05-27 MED ORDER — AZITHROMYCIN 250 MG PO TABS
ORAL_TABLET | ORAL | 0 refills | Status: AC
Start: 2021-05-27 — End: 2021-06-01

## 2021-05-27 NOTE — Progress Notes (Signed)
Z-pack called in after pt reports coughing up green sputum. Given albuterol and budesonide nebs the other day. Will ask pt to STOP steroids while on abx.

## 2021-05-27 NOTE — Telephone Encounter (Signed)
Abx provided in separate my chart encounter.

## 2021-05-27 NOTE — Telephone Encounter (Signed)
Guy Martinez called requesting a z-pack for his cough. Please advise.    [facility] [place], [place] - [address] [facility]   Phone: [phone]  Fax: [phone]

## 2021-06-05 ENCOUNTER — Encounter: Payer: Self-pay | Admitting: Internal Medicine

## 2021-06-05 ENCOUNTER — Ambulatory Visit: Payer: No Typology Code available for payment source | Admitting: Internal Medicine

## 2021-06-05 DIAGNOSIS — R051 Acute cough: Secondary | ICD-10-CM

## 2021-06-05 MED ORDER — BENZONATATE 100 MG PO CAPS
100.0000 mg | ORAL_CAPSULE | Freq: Four times a day (QID) | ORAL | 0 refills | Status: DC | PRN
Start: 2021-06-05 — End: 2021-07-24

## 2021-06-05 MED ORDER — PREDNISONE 20 MG PO TABS
40.0000 mg | ORAL_TABLET | Freq: Every day | ORAL | 0 refills | Status: AC
Start: 2021-06-05 — End: 2021-06-10

## 2021-06-05 MED ORDER — GUAIFENESIN-CODEINE 100-10 MG/5ML PO SYRP
5.0000 mL | ORAL_SOLUTION | Freq: Three times a day (TID) | ORAL | 0 refills | Status: DC | PRN
Start: 2021-06-05 — End: 2021-08-05

## 2021-06-05 MED ORDER — BUDESONIDE 0.5 MG/2ML IN SUSP
0.5000 mg | Freq: Every day | RESPIRATORY_TRACT | 0 refills | Status: DC
Start: 2021-06-05 — End: 2021-08-05

## 2021-06-05 NOTE — Progress Notes (Signed)
Patient ID: Guy Martinez is a 51 y.o. male     Chief Complaint   Patient presents with    Follow-up     cough        Pt here today reports on going cough. Previous course consisted of a hospital visit that was negative for pneumonia.  Given course of azithromycin, Albuterol nebulizer, and budesonide nebulizer. Symptoms are improved overall but has had a dry non-productive cough that is not completely resolved. Denies any fevers, chills, SOB, wheezing, malaise, or sinus congestion.  this     The following sections were reviewed this encounter by the provider:        Review of Systems   Constitutional:  Negative for fatigue and fever.   Respiratory:  Positive for cough. Negative for apnea, choking, chest tightness, shortness of breath, wheezing and stridor.         BP 130/83 (BP Site: Right arm, Patient Position: Sitting, Cuff Size: Large)   Pulse 71   Temp 98.4 F (36.9 C) (Oral)   Wt 126.1 kg (278 lb)   SpO2 97%   BMI 38.77 kg/m     Objective:     Physical Exam  Vitals reviewed.   Constitutional:       Appearance: Normal appearance. He is normal weight.   HENT:      Mouth/Throat:      Mouth: Mucous membranes are moist.      Pharynx: Oropharynx is clear. Posterior oropharyngeal erythema present. No oropharyngeal exudate.   Cardiovascular:      Rate and Rhythm: Normal rate and regular rhythm.      Pulses: Normal pulses.      Heart sounds: Normal heart sounds.   Pulmonary:      Effort: Pulmonary effort is normal. No respiratory distress.      Breath sounds: Normal breath sounds. No stridor. No wheezing, rhonchi or rales.      Comments: Diffuse coarse lung sounds that resolved after coughing.  Chest:      Chest wall: No tenderness.   Neurological:      Mental Status: He is alert.        Assessment:     1. Acute cough  - predniSONE (DELTASONE) 20 MG tablet; Take 2 tablets (40 mg) by mouth daily for 5 days  Dispense: 10 tablet; Refill: 0  - budesonide (PULMICORT) 0.5 MG/2ML nebulizer solution; Take 2 mLs (0.5 mg)  by nebulization daily  Dispense: 50 mL; Refill: 0  - guaiFENesin-codeine (ROBITUSSIN AC) 100-10 MG/5ML syrup; Take 5 mLs by mouth 3 (three) times daily as needed for Cough  Dispense: 118 mL; Refill: 0  - benzonatate (TESSALON) 100 MG capsule; Take 1 capsule (100 mg) by mouth every 6 (six) hours as needed for Cough  Dispense: 30 capsule; Refill: 0        Plan:     Acute cough  -Suspect secondary to previous illness.  Most likely a postnasal drip or residual bronchitis.  Without any fever or productive cough low suspicion for bacterial infection.  -Advised patient to resume his budesonide inhaler until symptoms are improved.  If cough continues despite nebulizer was given course of oral steroids.  -Cough suppressant medication provided.  -Asked to follow up with any other questions or concerns      Sarina Ill, MD

## 2021-06-06 ENCOUNTER — Other Ambulatory Visit: Payer: Self-pay | Admitting: Internal Medicine

## 2021-06-06 DIAGNOSIS — R051 Acute cough: Secondary | ICD-10-CM

## 2021-06-09 ENCOUNTER — Ambulatory Visit (INDEPENDENT_AMBULATORY_CARE_PROVIDER_SITE_OTHER): Payer: No Typology Code available for payment source | Admitting: Physician Assistant

## 2021-06-11 ENCOUNTER — Telehealth: Payer: No Typology Code available for payment source | Admitting: Physician Assistant

## 2021-06-19 NOTE — Progress Notes (Signed)
ROI received and sent to [facility]

## 2021-07-03 ENCOUNTER — Ambulatory Visit (INDEPENDENT_AMBULATORY_CARE_PROVIDER_SITE_OTHER): Payer: No Typology Code available for payment source

## 2021-07-03 DIAGNOSIS — G4733 Obstructive sleep apnea (adult) (pediatric): Secondary | ICD-10-CM

## 2021-07-03 NOTE — Progress Notes (Signed)
Patient arrived to try a traditional FULL FACE mask.  Patient was fitted with a F20 medium mask with LARGE HEADGEAR.  Patient paid $177 CC.

## 2021-07-22 ENCOUNTER — Telehealth: Payer: Self-pay | Admitting: Internal Medicine

## 2021-07-22 NOTE — Telephone Encounter (Signed)
Dr. Selena Batten, Mr. Guy Martinez called requesting a call back from you. He  wants to speak to you about going to [facility] w/ spouse about being evaluated for liver transplant.     He also request having prior ct scans on disc. I did give him information on how to receive those from [place].     Thank you.

## 2021-07-22 NOTE — Telephone Encounter (Signed)
Spoke to pt about his wife's diagnosis. Agreed follow up with [facility], [facility], and [facility] is appropriate..    Would like to continue improving his diet and exercise. Has met with Velna Hatchet. Recommended looking at [employer] or a local trainer.

## 2021-07-23 ENCOUNTER — Encounter: Payer: Self-pay | Admitting: Internal Medicine

## 2021-07-23 ENCOUNTER — Telehealth: Payer: Self-pay | Admitting: Internal Medicine

## 2021-07-23 NOTE — Telephone Encounter (Signed)
Referral for routine screening colonodcopy. Thank you.

## 2021-07-23 NOTE — Progress Notes (Unsigned)
SLEEP MEDICINE OFFICE PROGRESS NOTE    [facility] [place]IOLOGY  [address]  [place] [address]  Dept: [phone]  Dept Fax: [phone]       Patient Name: Guy Martinez    Date of Visit:  July 24, 2021  Date of Birth: 08-27-1970  AGE: 51 y.o.  Medical Record #: [id number]  Requesting Physician: Sarina Ill, MD    CHIEF COMPLAINT:  Follow-up (Sleep apnea)         HISTORY OF PRESENT ILLNESS:    Guy Martinez is a pleasant 51 y.o. male who presents for sleep medicine follow up.  Past medical history significant for hypertension, hyperlipidemia, elevated BMI, and moderate obstructive sleep apnea as demonstrated on HST 03/27/2021 with an AHI of 27.3 and an O2 nadir of 66%.    At last office visit 04/03/2021 with Dr. Bethann Punches, the pathophysiology of sleep apnea was reviewed along with various treatment options.  Patient decided to initiate treatment with PAP and an order for device was sent to the DME of his preference.    Compliance report 06/24/2021 - 07/23/2021 shows 77% compliance with an average use of greater than 7 hours each night.  AHI is well-controlled at 0.7.  No significant leak.    Did not start device use until recently due to some ongoing respiratory issues.  Since starting on device approximately 1 month ago has been using consistently and feels benefit in his sleep quality.  Current mask and pressure settings are comfortable.  Cleaning the device by hand as recommended.    PAST MEDICAL HISTORY: He has a past medical history of H/O Coronary CT Angiogram (10/2010), H/O echocardiogram (10/2014), H/O stress echocardiogram (02/2009), H/O treadmill stress test (01/2008, 05/2010, 11/2015, 12/12/15), Hyperlipidemia, Hypertension, Obesity, and Seasonal allergic rhinitis. 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    ALLERGIES:   No Known Allergies    MEDICATIONS:   Patient's current medications were reviewed. ONLY Sleep  Medicine related medications were updated unless others were addressed in assessment and plan.  Current Outpatient Medications   Medication Instructions    atorvastatin (LIPITOR) 10 mg, Oral, Daily    benzonatate (TESSALON) 100 mg, Oral, Every 6 hours PRN    budesonide (PULMICORT) 0.5 mg, Nebulization, Daily    guaiFENesin-codeine (ROBITUSSIN AC) 100-10 MG/5ML syrup 5 mLs, Oral, 3 times daily PRN    lisinopril (ZESTRIL) 40 mg, Oral, Daily    ondansetron (ZOFRAN-ODT) 4 mg, Oral, Every 6 hours PRN    VITAMIN D PO Oral       FAMILY HISTORY: family history includes Appendicitis in his son; Asthma in his son; COPD in his paternal grandfather; Cancer (age of onset: 4) in his father; Diabetes in his father, paternal grandfather, and paternal grandparent; Diverticulitis in his mother; Emphysema in his paternal grandparent; Heart attack in his paternal grandfather; Heart attack (age of onset: 69) in his father; Heart disease in his father and paternal grandparent; Hyperlipidemia in his father; Hypertension in his father; Myocardial Infarction in his father; No known problems in his brother, paternal grandmother, sister, son, and son; Pancreatic cancer (age of onset: 78) in his paternal uncle; Stroke (age of onset: 65) in his father.    SOCIAL HISTORY: He reports that he quit smoking about 11 years ago. His smoking use included cigarettes. He quit smokeless tobacco use about 13 years ago.  His smokeless tobacco use included snuff. He reports that he does not currently  use alcohol after a past usage of about 15.0 - 20.0 standard drinks per week. He reports that he does not use drugs.      PHYSICAL EXAM:    Visit Vitals  BP 132/84 (BP Site: Left arm, Patient Position: Sitting, Cuff Size: Large)   Pulse 74   Ht 1.803 m (5\' 11" )   Wt 124.3 kg (274 lb)   SpO2 96%   BMI 38.22 kg/m        Constitutional: Cooperative, alert and oriented,well developed, well nourished, in no acute distress.,   Head: Normocephalic, normal hair  pattern, facemask  Eyes: conjunctivae and lids normal   Chest: Normal respiration without extremus   Neurological: No gross motor or sensory deficits noted  Psych: affect appropriate, oriented to time, person and place.      LABS:   Lab Results   Component Value Date    WBC 5.8 10/10/2020    HGB 14.2 10/10/2020    HCT 41.4 10/10/2020    PLT 213 10/10/2020     No results found for: IRON, TIBC, FERRITIN  Lab Results   Component Value Date    TSH 2.87 10/10/2020         IMPRESSION:     Obstructive Sleep Apnea (moderate): Using and benefiting from PAP therapy.   - Reported symptoms of snoring, poor sleep quality, and daytime fatigue. Previous ESS 12.   - HST 03/27/2021 with an AHI of 27.3 and an O2 nadir of 66%. Wt 280 LBS.   - Device: AS10 APAP 4-16 cm H2O (initiated 04/13/2021)   - Mask: AirFitF20, well tolerated. Failed to tolerate oronasal mask.   - DME: [facility]   - CR: 06/24/2021 - 07/23/2021 shows 77% compliance with an average use of greater than 7 hours each night.  AHI is well-controlled at 0.7. ESS 5.    Hypertension   -Reports as stable, following with [facility] cardiology team.    Elevated BMI   -Carrying extra weight on your frame can contribute to the severity of OSA. Weight loss encouraged.    RECOMMENDATIONS:    Prior office notes, prior testing, and compliance data reviewed today. Patient is using and benefiting from PAP therapy. The patient was directed to use the device ATLEAST 4 hours each night to meet insurance compliance, but use with EVERY hour of sleep encouraged for best benefit. No indication for pressure adjustment or further testing at this time.   Reports mask fit and pressure settings are comfortable. Device cleaning/maintenance reviewed.  Continue to work directly with the DME team to improve supplies/mask experience.  Return for 6 month compliance, sooner if needed.                                                 Orders Placed This Encounter   Procedures    Sleep APP Visit ([facility])          No orders of the defined types were placed in this encounter.        Avoid driving or engaging in other activities requiring sustained vigilance if feeling sleepy.      SIGNED:    Luiz Blare, PA        This note was generated by the Dragon speech recognition and may contain errors or omissions not intended by the user. Grammatical errors, random word insertions,  deletions, pronoun errors, and incomplete sentences are occasional consequences of this technology due to software limitations. Not all errors are caught or corrected. If there are questions or concerns about the content of this note or information contained within the body of this dictation, they should be addressed directly with the author for clarification.

## 2021-07-24 ENCOUNTER — Encounter (INDEPENDENT_AMBULATORY_CARE_PROVIDER_SITE_OTHER): Payer: Self-pay | Admitting: Physician Assistant

## 2021-07-24 ENCOUNTER — Ambulatory Visit (INDEPENDENT_AMBULATORY_CARE_PROVIDER_SITE_OTHER): Payer: No Typology Code available for payment source | Admitting: Physician Assistant

## 2021-07-24 VITALS — BP 132/84 | HR 74 | Ht 71.0 in | Wt 274.0 lb

## 2021-07-24 DIAGNOSIS — G4733 Obstructive sleep apnea (adult) (pediatric): Secondary | ICD-10-CM

## 2021-07-24 DIAGNOSIS — I10 Essential (primary) hypertension: Secondary | ICD-10-CM

## 2021-07-24 DIAGNOSIS — Z6838 Body mass index (BMI) 38.0-38.9, adult: Secondary | ICD-10-CM

## 2021-07-29 ENCOUNTER — Encounter (INDEPENDENT_AMBULATORY_CARE_PROVIDER_SITE_OTHER): Payer: Self-pay

## 2021-07-30 ENCOUNTER — Telehealth (INDEPENDENT_AMBULATORY_CARE_PROVIDER_SITE_OTHER): Payer: Self-pay

## 2021-07-30 NOTE — Telephone Encounter (Signed)
Patient Name: Guy Martinez  Date of Birth: 18-Apr-1971    Date: 07/30/2021    Referral received. Contacted patient via telephone to orient about [facility] outpatient support and assess the needs and/or appropriateness for services.    Assessment :  Client requests short term counseling and is appropriate for support at this time.    Caregiver, seeking guidance to help support spouse and children (has not noticed any behavorial or emotional changes).    Referred to:  [facility] Oncology Therapist      Preferences:  In person    Location:  [place]    Date/ Time of day:  Mornings    Suggested Therapist request:  Barrie Lyme    Area of expertise request:  caregiver    Emailed client required documentation for completion.  Once paperwork is returned, [facility] scheduler will call patient directly to schedule with [facility] therapist.      Fuller Mandril  Behavioral Health Intake Coordinator  [facility]  [phone]

## 2021-08-03 ENCOUNTER — Other Ambulatory Visit: Payer: Self-pay | Admitting: Internal Medicine

## 2021-08-03 DIAGNOSIS — E7800 Pure hypercholesterolemia, unspecified: Secondary | ICD-10-CM

## 2021-08-04 ENCOUNTER — Ambulatory Visit (INDEPENDENT_AMBULATORY_CARE_PROVIDER_SITE_OTHER): Payer: No Typology Code available for payment source

## 2021-08-04 DIAGNOSIS — E7800 Pure hypercholesterolemia, unspecified: Secondary | ICD-10-CM

## 2021-08-04 NOTE — Progress Notes (Signed)
Blood draw:  Using aseptic technique, blood was drawn from the left antecubital without any complication. Attempt # 1. Patient tolerated procedure well. All labs will be sent to [facility].    -1 SSTs     Patient tolerated procedure well and left in good condition today.

## 2021-08-05 ENCOUNTER — Encounter (INDEPENDENT_AMBULATORY_CARE_PROVIDER_SITE_OTHER): Payer: Self-pay

## 2021-08-05 ENCOUNTER — Encounter: Payer: Self-pay | Admitting: Internal Medicine

## 2021-08-05 ENCOUNTER — Other Ambulatory Visit (INDEPENDENT_AMBULATORY_CARE_PROVIDER_SITE_OTHER): Payer: Self-pay | Admitting: Gastroenterology

## 2021-08-05 ENCOUNTER — Ambulatory Visit (INDEPENDENT_AMBULATORY_CARE_PROVIDER_SITE_OTHER): Payer: No Typology Code available for payment source | Admitting: Internal Medicine

## 2021-08-05 VITALS — BP 146/91 | HR 62 | Temp 98.0°F | Resp 16

## 2021-08-05 DIAGNOSIS — R2241 Localized swelling, mass and lump, right lower limb: Secondary | ICD-10-CM

## 2021-08-05 DIAGNOSIS — I10 Essential (primary) hypertension: Secondary | ICD-10-CM

## 2021-08-05 DIAGNOSIS — Z6841 Body Mass Index (BMI) 40.0 and over, adult: Secondary | ICD-10-CM

## 2021-08-05 DIAGNOSIS — E7800 Pure hypercholesterolemia, unspecified: Secondary | ICD-10-CM

## 2021-08-05 LAB — COMPREHENSIVE METABOLIC PANEL
ALT: 41 U/L (ref 9–46)
AST (SGOT): 25 U/L (ref 10–35)
Albumin/Globulin Ratio: 2.5 (calc) (ref 1.0–2.5)
Albumin: 4.9 g/dL (ref 3.6–5.1)
Alkaline Phosphatase: 60 U/L (ref 35–144)
BUN: 20 mg/dL (ref 7–25)
Bilirubin, Total: 0.7 mg/dL (ref 0.2–1.2)
CO2: 26 mmol/L (ref 20–32)
Calcium: 9.9 mg/dL (ref 8.6–10.3)
Chloride: 106 mmol/L (ref 98–110)
Creatinine: 1.19 mg/dL (ref 0.70–1.30)
Globulin: 2 g/dL (calc) (ref 1.9–3.7)
Glucose: 102 mg/dL — ABNORMAL HIGH (ref 65–99)
Potassium: 4.7 mmol/L (ref 3.5–5.3)
Protein, Total: 6.9 g/dL (ref 6.1–8.1)
Sodium: 140 mmol/L (ref 135–146)
eGFR: 74 mL/min/{1.73_m2} (ref >=60)

## 2021-08-05 LAB — LIPID PANEL
Cholesterol / HDL Ratio: 4.5 (calc) (ref <5.0)
Cholesterol: 136 mg/dL (ref <200)
HDL: 30 mg/dL — ABNORMAL LOW (ref >=40)
LDL Calculated: 89 mg/dL (calc)
NON HDL CHOLESTEROL: 106 mg/dL (calc) (ref <130)
Triglycerides: 81 mg/dL (ref <150)

## 2021-08-05 NOTE — Progress Notes (Signed)
Subjective:      Patient ID: Guy Martinez is a 51 y.o. male     Chief Complaint   Patient presents with    Follow-up     Wife recently diagnosed with liver cancer. Wants to be proactive with his own health so he can support her. Wants to review labs, check toe and R buttock.        Patient here for follow-up for recent blood work, nodule in his inner right thigh, and a spot on his left toe    Patient reports after getting a pedicure about 2 weeks ago he noticed a small brown spot that is slowly getting larger.  Denies any bleeding, discharge, or pain.  No discomfort noted during the pedicure itself.    Patient has noticed a nodule on his right inner thigh several weeks ago while showering.  It is firm, nontender, mobile, fairly well-defined borders, without any skin changes.  Noted while showering.          The following sections were reviewed this encounter by the provider:        Review of Systems   Constitutional:  Negative for fatigue.   Skin:  Negative for color change, pallor, rash and wound.        BP (!) 146/91 (BP Site: Right arm, Patient Position: Sitting, Cuff Size: Large)   Pulse 62   Temp 98 F (36.7 C) (Oral)   Resp 16   SpO2 98%     Objective:     Physical Exam  Vitals reviewed.   Constitutional:       Appearance: Normal appearance. He is normal weight.   Skin:     General: Skin is warm and dry.      Coloration: Skin is not jaundiced or pale.      Findings: No bruising, erythema, lesion or rash.      Comments: Small firm nodule in the right inner thigh.  Mobile with well-defined borders.  No tenderness or skin changes.    Left great toe shows a small brown spot that looks to be progressing down the nail.  There is no tenderness, pain, discharge, or bleeding   Neurological:      Mental Status: He is alert.        Assessment:     1. Nodule of skin of right lower leg  - [place] THORAX SOFT TISSUE; Future    2. BMI 40.0-44.9, adult    3. Pure hypercholesterolemia    4. Primary hypertension        Plan:      Nodule of inner right thigh  -Suspect lymph node.  Checking ultrasound    Spot on left toe  -Suspect bleeding from trauma related to pedicure.  Suspect the bleeding is underneath the nail and slowly progressing downward.  Monitor for now.    The 10-year ASCVD risk score (Arnett DK, et al., 2019) is: 5.6%    Values used to calculate the score:      Age: 83 years      Sex: Male      Is Non-Hispanic African American: No      Diabetic: No      Tobacco smoker: No      Systolic Blood Pressure: 146 mmHg      Is BP treated: Yes      HDL Cholesterol: 30 mg/dL      Total Cholesterol: 136 mg/dL      Office follow-up in 1 month for physical  Sarina Ill, MD

## 2021-08-06 ENCOUNTER — Telehealth (INDEPENDENT_AMBULATORY_CARE_PROVIDER_SITE_OTHER): Payer: Self-pay

## 2021-08-06 NOTE — Telephone Encounter (Signed)
Date: 08/06/2021       [facility] representative spoke with patient, outpatient counseling intake forms were resent. Encouraged to complete and return to move forward with scheduling      Jan Fireman Intake Coordinator  [facility]  [phone]

## 2021-08-11 ENCOUNTER — Telehealth (INDEPENDENT_AMBULATORY_CARE_PROVIDER_SITE_OTHER): Payer: Self-pay

## 2021-08-11 ENCOUNTER — Other Ambulatory Visit (INDEPENDENT_AMBULATORY_CARE_PROVIDER_SITE_OTHER): Payer: Self-pay | Admitting: Gastroenterology

## 2021-08-11 DIAGNOSIS — Z0000 Encounter for general adult medical examination without abnormal findings: Secondary | ICD-10-CM | POA: Insufficient documentation

## 2021-08-11 NOTE — Telephone Encounter (Signed)
Pt left message to Reschedule the just schedule one
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
## 2021-08-12 NOTE — Telephone Encounter (Signed)
Pt left message to reschedule April procedure to may

## 2021-08-18 ENCOUNTER — Telehealth (INDEPENDENT_AMBULATORY_CARE_PROVIDER_SITE_OTHER): Payer: Self-pay

## 2021-08-18 ENCOUNTER — Encounter (INDEPENDENT_AMBULATORY_CARE_PROVIDER_SITE_OTHER): Payer: No Typology Code available for payment source | Admitting: Gastroenterology

## 2021-08-18 ENCOUNTER — Telehealth (INDEPENDENT_AMBULATORY_CARE_PROVIDER_SITE_OTHER): Payer: Self-pay | Admitting: Critical Care Medicine

## 2021-08-18 NOTE — Telephone Encounter (Signed)
Pt left message- saying he called several time    Pt need to cancel and reschedule 09/01/21 procedure with dr. Malva Limes

## 2021-08-18 NOTE — Telephone Encounter (Signed)
lvm returning patient's call to order supplies

## 2021-08-20 ENCOUNTER — Encounter (INDEPENDENT_AMBULATORY_CARE_PROVIDER_SITE_OTHER): Payer: Self-pay

## 2021-08-20 ENCOUNTER — Other Ambulatory Visit (INDEPENDENT_AMBULATORY_CARE_PROVIDER_SITE_OTHER): Payer: Self-pay | Admitting: Nurse Practitioner

## 2021-08-20 MED ORDER — PEG 3350-KCL-NABCB-NACL-NASULF 236 G PO SOLR
ORAL | 0 refills | Status: DC
Start: 2021-08-20 — End: 2022-02-18

## 2021-08-20 NOTE — Telephone Encounter (Signed)
Patient rescheduled to 10/02/21 at 9:15 at [facility]

## 2021-08-20 NOTE — Progress Notes (Signed)
Prep for colonoscopy was sent to pharmacy.

## 2021-08-26 ENCOUNTER — Encounter: Payer: Self-pay | Admitting: Internal Medicine

## 2021-08-26 DIAGNOSIS — M7660 Achilles tendinitis, unspecified leg: Secondary | ICD-10-CM

## 2021-08-26 NOTE — Progress Notes (Signed)
Referral to podiatry, Dr. Rito Ehrlich, for on going achilles pain.

## 2021-08-27 IMAGING — MR MR CERVICAL SPINE W/O CM
5 series · 28 of 48 positions shown · non-contrast
Comparison: Radiographs of the cervical spine [DATE] (images
available, report unavailable).

CLINICAL DATA: Radiculopathy, cervical region. Additional history
provided by scanning technologist: Patient reports left arm pain and
reports left foot numbness.

EXAM:
MRI CERVICAL SPINE WITHOUT CONTRAST
TECHNIQUE: Multiplanar, multisequence MR imaging of the cervical spine was
performed. No intravenous contrast was administered.

[Series 2: T2 · sagittal · 3.0mm · 0.82mm/px · 6 of 17 slices shown (1 of 2)]
[im 1/17]
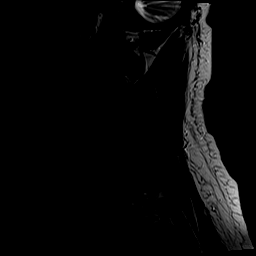
[im 4/17]
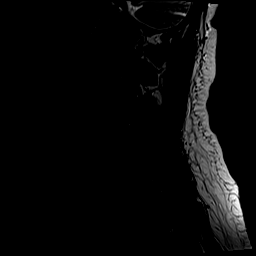
[im 7/17]
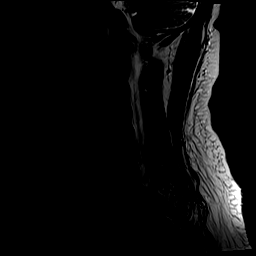
[im 10/17]
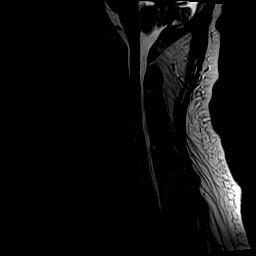
[im 13/17]
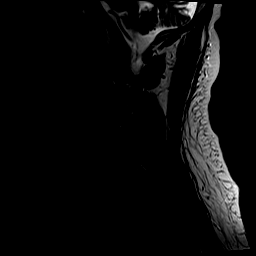
[im 17/17]
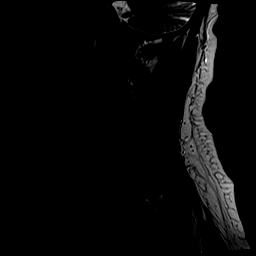

[Series 3: T1 · sagittal · 3.0mm · 0.41mm/px · 6 of 17 slices shown]
[im 1/17]
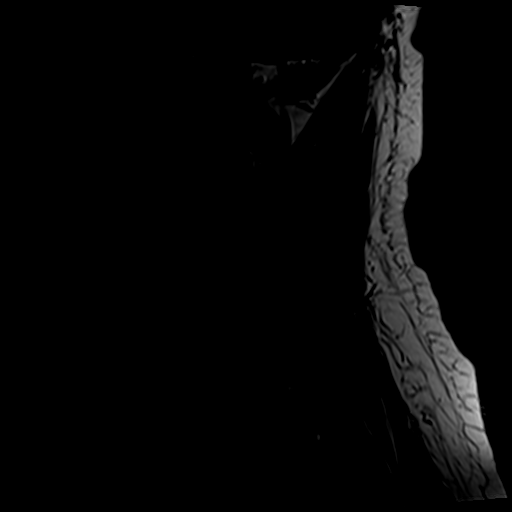
[im 4/17]
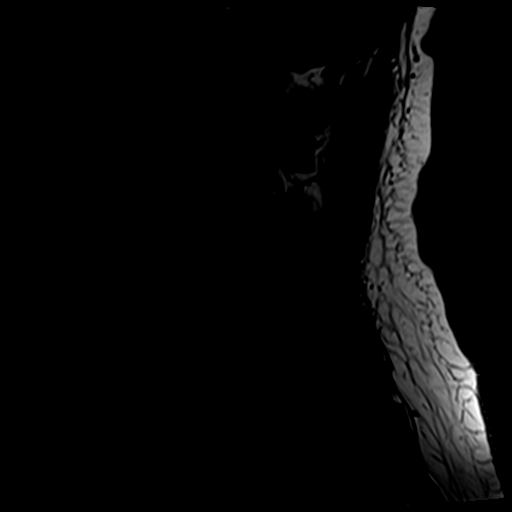
[im 7/17]
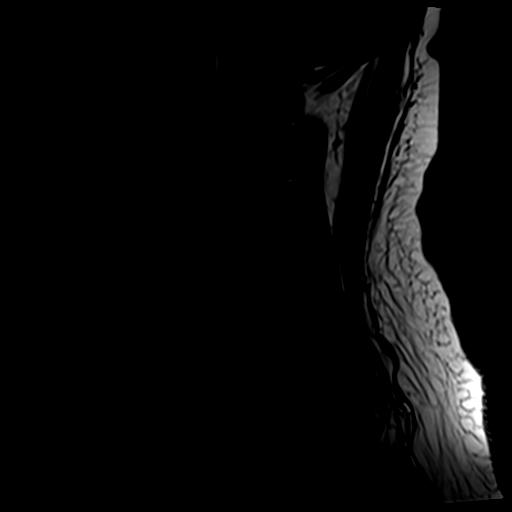
[im 10/17]
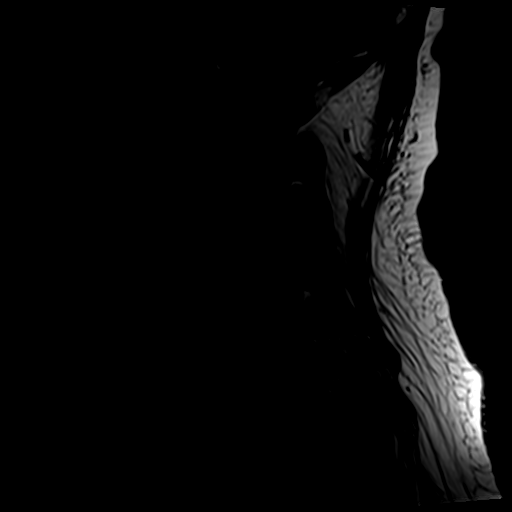
[im 13/17]
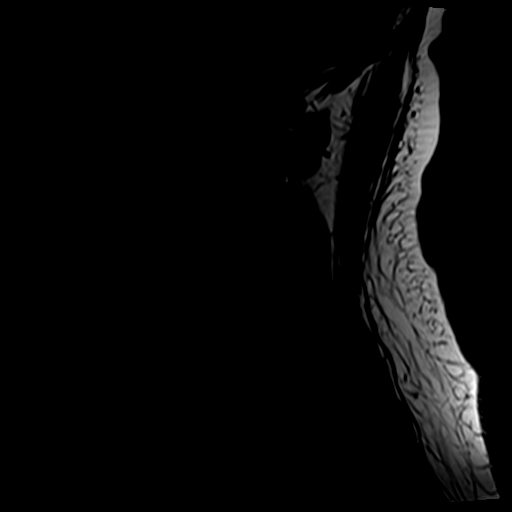
[im 17/17]
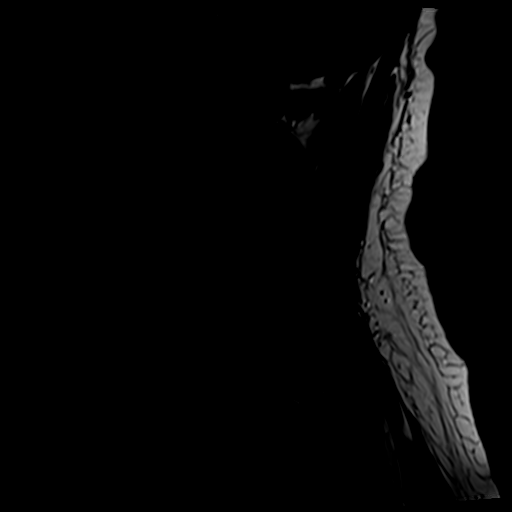

[Series 4: tir sag · sagittal · 3.0mm · 0.41mm/px · 6 of 17 slices shown]
[im 1/17]
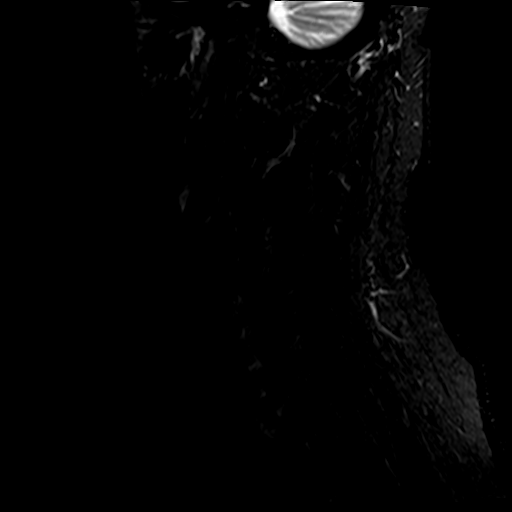
[im 4/17]
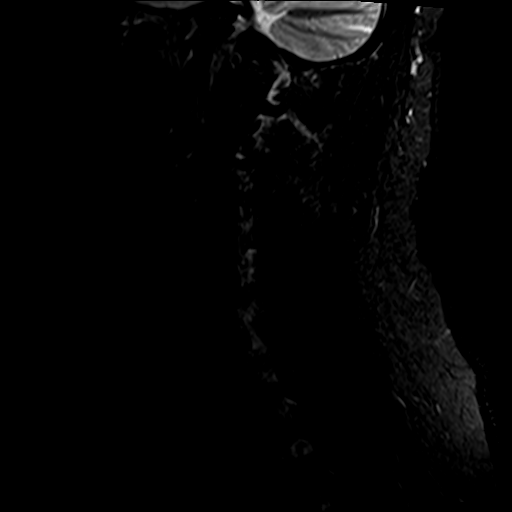
[im 7/17]
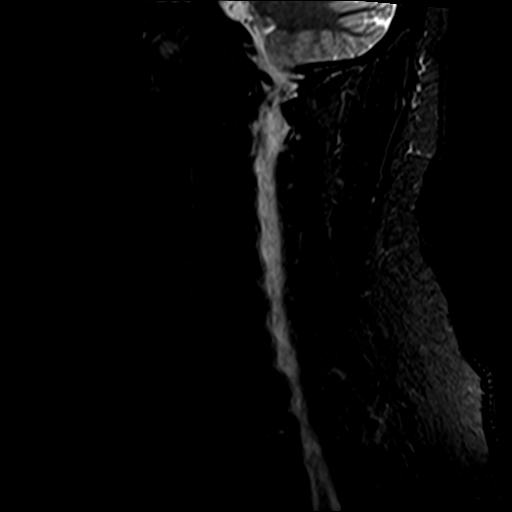
[im 10/17]
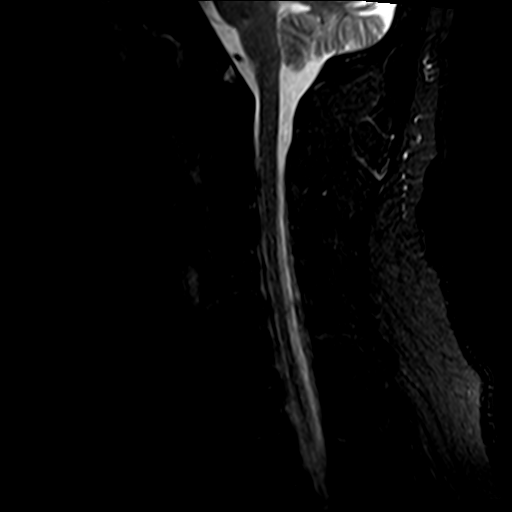
[im 13/17]
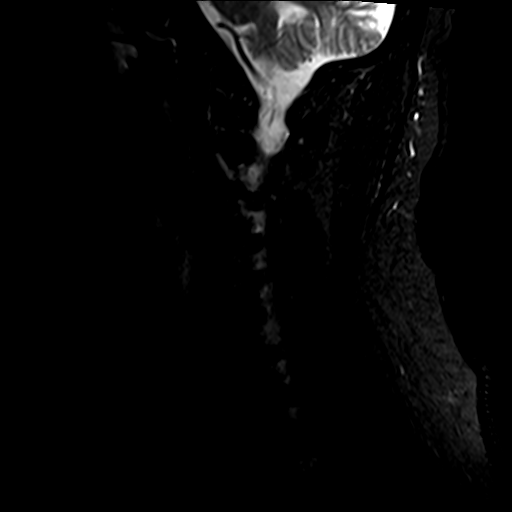
[im 17/17]
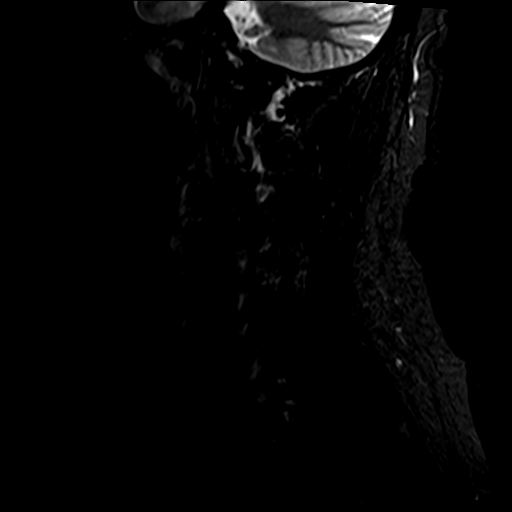

[Series 5: GRE · axial · 3.0mm · 0.39mm/px · 1 of 40 slices shown]
[im 1/40]
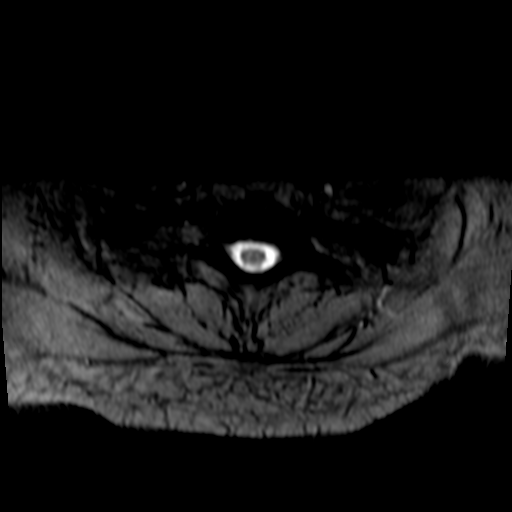

[Series 6: T2 · axial · 3.0mm · 0.78mm/px · z∈[-22,+122]mm · 9 of 40 slices shown (2 of 2)]
[im 1/40]
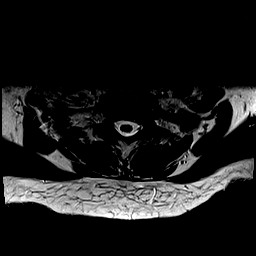
[im 6/40]
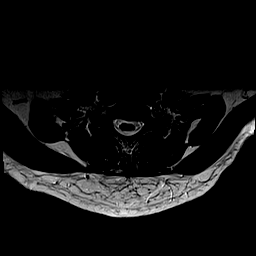
[im 12/40]
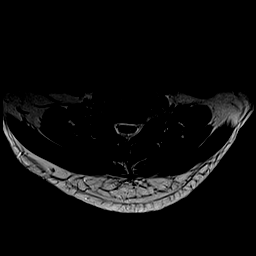
[im 17/40]
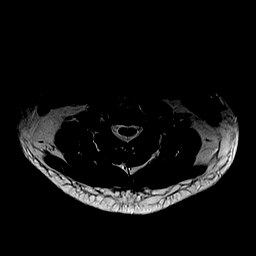
[im 20/40]
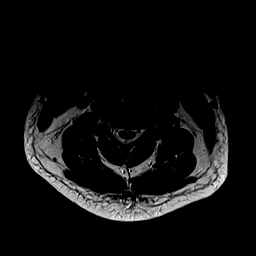
[im 23/40]
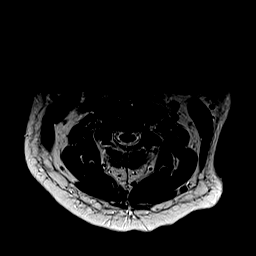
[im 28/40]
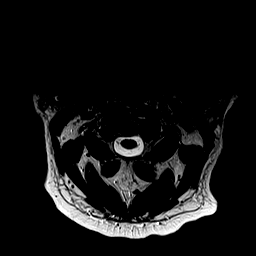
[im 34/40]
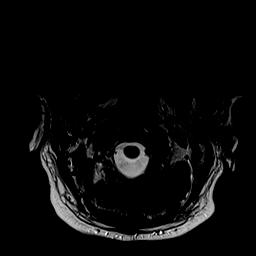
[im 40/40]
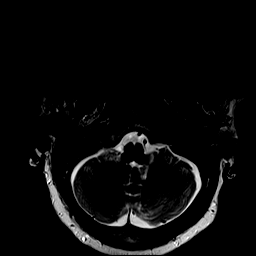

[28 of 48 positions shown; findings below may reference images not displayed]

FINDINGS: The sagittal STIR sequence is moderate to severely motion degraded,
limiting evaluation.

Alignment: Straightening of the expected cervical lordosis. No
significant spondylolisthesis.

Vertebrae: Vertebral body height is maintained. Within the
limitations of motion degraded sagittal STIR imaging, no marrow
edema or focal suspicious osseous lesion is identified. Small
ventral osteophyte at C5-C6.

Cord: No signal abnormality identified within the cervical spinal
cord.

Posterior Fossa, vertebral arteries, paraspinal tissues: No
abnormality identified within included portions of the posterior
fossa. Flow voids preserved within the imaged cervical vertebral
arteries. Paraspinal soft tissues unremarkable.

Disc levels:

Multilevel disc degeneration, greatest at C5-C6 (moderate at this
level).

C2-C3: Small central disc protrusion. The disc protrusion results in
mild focal effacement of the ventral thecal sac and contacts the
ventral spinal cord. No significant foraminal stenosis.

C3-C4: No significant disc herniation or stenosis.

C4-C5: No significant disc herniation or spinal canal stenosis.
Uncovertebral hypertrophy on the right, resulting in mild right
neural foraminal narrowing.

C5-C6: Disc bulge with endplate spurring and right greater than left
uncovertebral hypertrophy. The disc bulge effaces the ventral thecal
sac, contacting and minimally flattening the ventral aspect of the
spinal cord (series 2, image 9). However, the dorsal CSF space is
maintained within the spinal canal. Bilateral neural foraminal
narrowing (moderate right, mild left).

C6-C7: No significant disc herniation or stenosis.

C7-T1: No significant disc herniation or stenosis.
IMPRESSION: Motion degraded examination.

Cervical spondylosis, as outlined and with findings most notably as
follows.

At C5-C6, there is moderate disc degeneration. Disc bulge with
endplate spurring and right greater than left uncovertebral
hypertrophy. The disc bulge effaces the ventral thecal sac,
contacting and minimally flattening the ventral aspect of the spinal
cord. However, the dorsal CSF space is maintained within the spinal
canal. Bilateral neural foraminal narrowing (moderate right, mild
left).

At C2-C3, there is a small central disc protrusion. The disc
protrusion results in mild focal effacement of the ventral thecal
sac, and contacts the ventral aspect of the spinal cord.

At C4-C5, uncovertebral hypertrophy results in mild right neural
foraminal narrowing.

Nonspecific straightening of the expected cervical lordosis.

## 2021-09-01 ENCOUNTER — Ambulatory Visit
Admission: RE | Admit: 2021-09-01 | Payer: No Typology Code available for payment source | Source: Ambulatory Visit | Admitting: Gastroenterology

## 2021-09-01 ENCOUNTER — Encounter: Admission: RE | Payer: Self-pay | Source: Ambulatory Visit

## 2021-09-01 DIAGNOSIS — Z0000 Encounter for general adult medical examination without abnormal findings: Secondary | ICD-10-CM | POA: Insufficient documentation

## 2021-09-01 SURGERY — DONT USE, USE 1094-COLONOSCOPY, DIAGNOSTIC (SCREENING)
Anesthesia: General | Site: Abdomen

## 2021-09-07 ENCOUNTER — Other Ambulatory Visit (INDEPENDENT_AMBULATORY_CARE_PROVIDER_SITE_OTHER): Payer: Self-pay | Admitting: Sleep Medicine

## 2021-09-07 DIAGNOSIS — Z9989 Dependence on other enabling machines and devices: Secondary | ICD-10-CM

## 2021-09-07 NOTE — Progress Notes (Signed)
Patient requesting Rx for AirSense 11 APAP unit.

## 2021-09-21 ENCOUNTER — Encounter: Payer: Self-pay | Admitting: Internal Medicine

## 2021-09-24 ENCOUNTER — Other Ambulatory Visit: Payer: Self-pay

## 2021-09-24 ENCOUNTER — Other Ambulatory Visit: Payer: Self-pay | Admitting: Internal Medicine

## 2021-09-24 DIAGNOSIS — Z1211 Encounter for screening for malignant neoplasm of colon: Secondary | ICD-10-CM

## 2021-09-24 DIAGNOSIS — E7800 Pure hypercholesterolemia, unspecified: Secondary | ICD-10-CM

## 2021-09-24 DIAGNOSIS — I10 Essential (primary) hypertension: Secondary | ICD-10-CM

## 2021-09-24 DIAGNOSIS — Z0000 Encounter for general adult medical examination without abnormal findings: Secondary | ICD-10-CM

## 2021-09-24 DIAGNOSIS — E559 Vitamin D deficiency, unspecified: Secondary | ICD-10-CM

## 2021-09-25 ENCOUNTER — Ambulatory Visit (INDEPENDENT_AMBULATORY_CARE_PROVIDER_SITE_OTHER): Payer: No Typology Code available for payment source

## 2021-09-25 DIAGNOSIS — I10 Essential (primary) hypertension: Secondary | ICD-10-CM

## 2021-09-25 DIAGNOSIS — E7800 Pure hypercholesterolemia, unspecified: Secondary | ICD-10-CM

## 2021-09-25 DIAGNOSIS — Z0000 Encounter for general adult medical examination without abnormal findings: Secondary | ICD-10-CM

## 2021-09-25 DIAGNOSIS — E559 Vitamin D deficiency, unspecified: Secondary | ICD-10-CM

## 2021-09-25 LAB — CBC AND DIFFERENTIAL
Absolute NRBC: 0 10*3/uL (ref 0.00–0.00)
Basophils Absolute Automated: 0.06 10*3/uL (ref 0.00–0.08)
Basophils Automated: 1 %
Eosinophils Absolute Automated: 0.2 10*3/uL (ref 0.00–0.44)
Eosinophils Automated: 3.4 %
Hematocrit: 42.5 % (ref 37.6–49.6)
Hgb: 14.2 g/dL (ref 12.5–17.1)
Immature Granulocytes Absolute: 0.02 10*3/uL (ref 0.00–0.07)
Immature Granulocytes: 0.3 %
Instrument Absolute Neutrophil Count: 3.12 10*3/uL (ref 1.10–6.33)
Lymphocytes Absolute Automated: 2.07 10*3/uL (ref 0.42–3.22)
Lymphocytes Automated: 35.1 %
MCH: 29 pg (ref 25.1–33.5)
MCHC: 33.4 g/dL (ref 31.5–35.8)
MCV: 86.9 fL (ref 78.0–96.0)
MPV: 10.4 fL (ref 8.9–12.5)
Monocytes Absolute Automated: 0.43 10*3/uL (ref 0.21–0.85)
Monocytes: 7.3 %
Neutrophils Absolute: 3.12 10*3/uL (ref 1.10–6.33)
Neutrophils: 52.9 %
Nucleated RBC: 0 /100 WBC (ref 0.0–0.0)
Platelets: 217 10*3/uL (ref 142–346)
RBC: 4.89 10*6/uL (ref 4.20–5.90)
RDW: 13 % (ref 11–15)
WBC: 5.9 10*3/uL (ref 3.10–9.50)

## 2021-09-25 LAB — URINALYSIS REFLEX TO MICROSCOPIC EXAM - REFLEX TO CULTURE
Bilirubin, UA: NEGATIVE
Blood, UA: NEGATIVE
Glucose, UA: NEGATIVE
Ketones UA: NEGATIVE
Leukocyte Esterase, UA: NEGATIVE
Nitrite, UA: NEGATIVE
Protein, UR: NEGATIVE
Specific Gravity UA: 1.018 (ref 1.001–1.035)
Urine pH: 5.5 (ref 5.0–8.0)
Urobilinogen, UA: NORMAL mg/dL

## 2021-09-25 LAB — COMPREHENSIVE METABOLIC PANEL
ALT: 48 U/L (ref 0–55)
AST (SGOT): 27 U/L (ref 5–41)
Albumin/Globulin Ratio: 2.1 (ref 0.9–2.2)
Albumin: 4.4 g/dL (ref 3.5–5.0)
Alkaline Phosphatase: 81 U/L (ref 37–117)
Anion Gap: 7 (ref 5.0–15.0)
BUN: 14 mg/dL (ref 9.0–28.0)
Bilirubin, Total: 0.6 mg/dL (ref 0.2–1.2)
CO2: 25 mEq/L (ref 17–29)
Calcium: 9 mg/dL (ref 8.5–10.5)
Chloride: 106 mEq/L (ref 99–111)
Creatinine: 1 mg/dL (ref 0.5–1.5)
Globulin: 2.1 g/dL (ref 2.0–3.6)
Glucose: 101 mg/dL — ABNORMAL HIGH (ref 70–100)
Potassium: 4.4 mEq/L (ref 3.5–5.3)
Protein, Total: 6.5 g/dL (ref 6.0–8.3)
Sodium: 138 mEq/L (ref 135–145)

## 2021-09-25 LAB — HEMOLYSIS INDEX: Hemolysis Index: 8 Index (ref 0–24)

## 2021-09-25 LAB — TSH: TSH: 3.16 u[IU]/mL (ref 0.35–4.94)

## 2021-09-25 LAB — GFR: EGFR: >60

## 2021-09-25 LAB — LIPID PANEL
Cholesterol / HDL Ratio: 4.3 Index
Cholesterol: 136 mg/dL (ref 0–199)
HDL: 32 mg/dL — ABNORMAL LOW (ref 40–9999)
LDL Calculated: 88 mg/dL (ref 0–99)
Triglycerides: 79 mg/dL (ref 34–149)
VLDL Calculated: 16 mg/dL (ref 10–40)

## 2021-09-25 LAB — PROSTATE SPECIFIC ANTIGEN SCREEN: Prostate Specific Antigen Screen: 0.721 ng/mL (ref 0.000–4.000)

## 2021-09-25 LAB — VITAMIN D,25 OH,TOTAL: Vitamin D, 25 OH, Total: 25 ng/mL — ABNORMAL LOW (ref 30–100)

## 2021-09-25 NOTE — Progress Notes (Signed)
Blood draw:  Using aseptic technique, blood was drawn from the left antecubital without any complication. Attempt # 1. Patient tolerated procedure well. All labs will be sent to [facility] refrigerated.    -2 SSTs      -1 Lav    -1 Yellow/red top for UA    -1 Grey top for Urine Culture    [employer] completed and sent with patient.    Patient tolerated procedure well and left in good condition today.

## 2021-10-02 ENCOUNTER — Encounter: Payer: No Typology Code available for payment source | Admitting: Internal Medicine

## 2021-10-09 ENCOUNTER — Ambulatory Visit: Payer: No Typology Code available for payment source | Admitting: Internal Medicine

## 2021-10-15 ENCOUNTER — Ambulatory Visit: Payer: No Typology Code available for payment source

## 2021-10-15 NOTE — PSS Phone Screening (Signed)
Pre-Anesthesia Evaluation    Pre-op phone visit requested by:   Reason for pre-op phone visit: Patient anticipating COLONOSCOPY procedure.    History of Present Illness/Summary:        Problem List:  Medical Problems       Hospital Problem List  Date Reviewed: 08/05/2021   None        Non-Hospital Problem List  Date Reviewed: 08/05/2021            ICD-10-CM Priority Class Noted    HLD (hyperlipidemia) E78.5   08/29/2019    Morbid obesity E66.01   08/29/2019    HTN (hypertension) I10   08/29/2019    Actinic keratosis L57.0   03/19/2020    Sebaceous cyst L72.3   03/19/2020    Body mass index 45.0-49.9, adult Z68.42   03/19/2020    COVID-19 virus infection - 2 positive home tests U07.1   05/19/2020    Alopecia L65.9   11/14/2020    Disorder of hair follicle L73.9   11/14/2020    Changes in skin texture R23.4   11/14/2020    Melanocytic nevi of trunk D22.5   11/14/2020    Melanocytic nevi, unspecified D22.9   11/14/2020    Other disorders of skin and subcutaneous tissue in diseases classified elsewhere L99   11/14/2020    Tinea pedis B35.3   11/14/2020    Seborrheic keratosis L82.1   11/14/2020    Acute effect of ultraviolet radiation on normal skin T66.XXXA   11/14/2020    Vitamin D deficiency E55.9   11/14/2020    OSA (obstructive sleep apnea) G47.33   04/10/2021    Overview Signed 04/10/2021  1:11 AM by Sarina Ill, MD     Starting on treatment. Following with [facility].           Lung nodule R91.1   05/04/2021    Overview Signed 05/04/2021 12:29 AM by Sarina Ill, MD     03/2021 LDCT - Stable 4 mm posterolateral left lower lobe nodule. Lung-RADS category 2, Benign Appearance or Behavior; nodules with a very low likelihood of becoming a clinically active cancer due to size or lack of growth: Continue annual screening with LDCT in 12 months.           Encounter for screening and preventative care Z00.00   08/11/2021    Overview Signed 08/11/2021 11:30 AM by Herbert Seta E     Added automatically from request for surgery [phone]                Medical History   Diagnosis Date    H/O Coronary CT Angiogram 10/2010    H/O echocardiogram 10/2014    H/O stress echocardiogram 02/2009    normal    H/O treadmill stress test 01/2008, 05/2010, 11/2015, 12/12/15    Hyperlipidemia     MANAGED WITH MEDS.    Hypertension     MANAGED WITH MEDS. USUALLY 130s/80s. MANAGED BY PCP.    Sleep apnea     +OSA - USES CPAP NIGHTLY.     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 Always use your most recent med list.                atorvastatin 10 MG tablet  Take  2 tablets (20 mg) by mouth daily  Commonly known as: LIPITOR  Medication Adjustments for Surgery: Take morning of surgery     lisinopril 40 MG tablet  Take 1 tablet (40 mg) by mouth daily  Commonly known as: ZESTRIL  Medication Adjustments for Surgery: Hold day of surgery     polyethylene glycol w/ electrolytes oral solution  Follow instructions given by gastroenterology  Commonly known as: GOLYTELY  Notes to patient: FOLLOW THE BOWEL PREP INSTRUCTIONS YOU RECEIVED FROM THE SURGEON'S OFFICE.            No Known Allergies  Family History   Problem Relation Age of Onset    Diverticulitis Mother     Cancer Father 47        Prostate, Bladder    Heart attack Father 29        4 MI's    Stroke Father         2 strokes    Hypertension Father     Diabetes Father     Hyperlipidemia Father     Heart disease Father     Myocardial Infarction Father     No known problems Sister     No known problems Brother     No known problems Son     No known problems Paternal Grandmother     COPD Paternal Grandfather     Heart attack Paternal Grandfather     Diabetes Paternal Grandfather     Appendicitis Son     Asthma Son     No known problems Son     Pancreatic cancer Paternal Uncle 71    Heart disease Paternal Grandparent     Diabetes Paternal Grandparent     Emphysema Paternal Grandparent         pulmonary     Social History      Occupational History    Not on file   Tobacco Use    Smoking status: Former     Packs/day: 0.25     Years: 2.00     Pack years: 0.50     Types: Cigarettes     Quit date: 03/05/2011     Years since quitting: 10.6    Smokeless tobacco: Former     Types: Snuff     Quit date: 05/31/2012    Tobacco comments:     Social use. 5-7 cig/week   Vaping Use    Vaping status: Never Used   Substance and Sexual Activity    Alcohol use: Yes     Alcohol/week: 10.0 standard drinks of alcohol     Types: 10 Glasses of wine per week     Comment: stopped 10 days ago    Drug use: Never    Sexual activity: Not on file             Exam Scores:   SDB score      PONV score Nausea Risk: LOW RISK    MST score MST Score: 0    Allergy score Risk Category: Low Risk    Frailty score         Visit Vitals  Ht 1.803 m (5\' 11" )   Wt 122.5 kg (270 lb)   BMI 37.66 kg/m       Recent Labs   CBC (last 180 days) 09/25/21  0815   WBC 5.90   RBC 4.89   Hgb 14.2   Hematocrit 42.5   MCV 86.9   MCH 29.0   MCHC  33.4   RDW 13   Platelets 217   MPV 10.4   Nucleated RBC 0.0   Absolute NRBC 0.00     Recent Labs   BMP (last 180 days) 08/04/21  0945 09/25/21  0815   Glucose 102* 101*   BUN 20 14.0   Creatinine 1.19 1.0   Sodium 140 138   Potassium 4.7 4.4   Chloride 106 106   CO2 26 25   Calcium 9.9 9.0   Anion Gap  --  7.0   EGFR  --  >60.0   eGFR 74  --          Recent Labs   Other (last 180 days) 08/04/21  0945 09/25/21  0815   TSH  --  3.16   ALT 41 48   AST (SGOT) 25 27   Protein, Total 6.9 6.5

## 2021-10-21 ENCOUNTER — Telehealth (INDEPENDENT_AMBULATORY_CARE_PROVIDER_SITE_OTHER): Payer: Self-pay

## 2021-10-21 NOTE — Telephone Encounter (Signed)
Patient have prep question.    Please reach out asap

## 2021-10-21 NOTE — Telephone Encounter (Signed)
Pt is scheduled for an colonoscopy tomorrow stated that he ate this morning 2 eggs, 2 pieces of toast, w/cheese on a egg sandwhich and also has taken lisinopril. Pt would like to know if he can still have his procedure done tomorrow if he has clear liquids for the remainder of the day. Can someone please follow up the best number pt can reached is [phone]

## 2021-10-21 NOTE — Telephone Encounter (Signed)
Patient has been rescheduled.

## 2021-10-21 NOTE — Telephone Encounter (Signed)
Pt. Informed that colon was canceled due to eating the day before colon and Dr.Butt notified

## 2021-10-22 ENCOUNTER — Ambulatory Visit
Admission: RE | Admit: 2021-10-22 | Payer: No Typology Code available for payment source | Source: Ambulatory Visit | Admitting: Gastroenterology

## 2021-10-22 ENCOUNTER — Encounter: Admission: RE | Payer: Self-pay | Source: Ambulatory Visit

## 2021-10-22 DIAGNOSIS — Z0000 Encounter for general adult medical examination without abnormal findings: Secondary | ICD-10-CM

## 2021-10-22 SURGERY — DONT USE, USE 1094-COLONOSCOPY, DIAGNOSTIC (SCREENING)
Anesthesia: General | Site: Anus

## 2021-10-28 ENCOUNTER — Encounter: Payer: Self-pay | Admitting: Internal Medicine

## 2021-10-28 DIAGNOSIS — Z005 Encounter for examination of potential donor of organ and tissue: Secondary | ICD-10-CM

## 2021-10-29 NOTE — Progress Notes (Signed)
Orders placed for blood type testing for donor evaluation.

## 2021-11-03 ENCOUNTER — Encounter: Payer: Self-pay | Admitting: Internal Medicine

## 2021-11-04 ENCOUNTER — Encounter: Payer: No Typology Code available for payment source | Admitting: Internal Medicine

## 2021-11-18 ENCOUNTER — Telehealth: Payer: Self-pay

## 2021-11-18 ENCOUNTER — Other Ambulatory Visit (INDEPENDENT_AMBULATORY_CARE_PROVIDER_SITE_OTHER): Payer: No Typology Code available for payment source | Admitting: Internal Medicine

## 2021-11-18 ENCOUNTER — Encounter: Payer: Self-pay | Admitting: Internal Medicine

## 2021-11-18 ENCOUNTER — Telehealth: Payer: Self-pay | Admitting: Internal Medicine

## 2021-11-18 ENCOUNTER — Ambulatory Visit: Payer: No Typology Code available for payment source

## 2021-11-18 DIAGNOSIS — J019 Acute sinusitis, unspecified: Secondary | ICD-10-CM

## 2021-11-18 DIAGNOSIS — U071 COVID-19: Secondary | ICD-10-CM

## 2021-11-18 LAB — IHS AMB POCT SOFIA (TM) COVID-19 & FLU A/B
Sofia Influenza A Ag POCT: NEGATIVE
Sofia Influenza B Ag POCT: POSITIVE — AB
Sofia SARS COV2 Antigen POCT: POSITIVE — AB

## 2021-11-18 MED ORDER — NIRMATRELVIR&RITONAVIR 300/100 20 X 150 MG & 10 X 100MG PO TBPK
3.0000 | ORAL_TABLET | Freq: Two times a day (BID) | ORAL | 0 refills | Status: AC
Start: 2021-11-18 — End: 2021-11-23

## 2021-11-24 ENCOUNTER — Other Ambulatory Visit (INDEPENDENT_AMBULATORY_CARE_PROVIDER_SITE_OTHER): Payer: Self-pay

## 2021-11-24 DIAGNOSIS — Z1211 Encounter for screening for malignant neoplasm of colon: Secondary | ICD-10-CM | POA: Insufficient documentation

## 2021-11-25 ENCOUNTER — Telehealth (INDEPENDENT_AMBULATORY_CARE_PROVIDER_SITE_OTHER): Payer: Self-pay | Admitting: Gastroenterology

## 2021-11-25 ENCOUNTER — Encounter (INDEPENDENT_AMBULATORY_CARE_PROVIDER_SITE_OTHER): Payer: Self-pay

## 2021-11-25 NOTE — Telephone Encounter (Signed)
Scheduler returned patients VM. Scheduler and patient reviewed prep instructions. Patient received schedulers direct number.

## 2021-12-02 ENCOUNTER — Ambulatory Visit: Payer: No Typology Code available for payment source

## 2021-12-02 NOTE — PSS Phone Screening (Signed)
Pre-Anesthesia Evaluation    Pre-op phone visit requested by:   Reason for pre-op phone visit: Patient anticipating COLONOSCOPY procedure.    No orders of the defined types were placed in this encounter.      History of Present Illness/Summary:        Problem List:  Medical Problems       Hospital Problem List  Date Reviewed: 08/05/2021   None        Non-Hospital Problem List  Date Reviewed: 08/05/2021            ICD-10-CM Priority Class Noted    HLD (hyperlipidemia) E78.5   08/29/2019    Morbid obesity E66.01   08/29/2019    HTN (hypertension) I10   08/29/2019    Actinic keratosis L57.0   03/19/2020    Sebaceous cyst L72.3   03/19/2020    Body mass index 45.0-49.9, adult Z68.42   03/19/2020    Alopecia L65.9   11/14/2020    Disorder of hair follicle L73.9   11/14/2020    Changes in skin texture R23.4   11/14/2020    Melanocytic nevi of trunk D22.5   11/14/2020    Melanocytic nevi, unspecified D22.9   11/14/2020    Other disorders of skin and subcutaneous tissue in diseases classified elsewhere L99   11/14/2020    Tinea pedis B35.3   11/14/2020    Seborrheic keratosis L82.1   11/14/2020    Vitamin D deficiency E55.9   11/14/2020    OSA (obstructive sleep apnea) G47.33   04/10/2021    Overview Signed 04/10/2021  1:11 AM by Sarina Ill, MD     Starting on treatment. Following with [facility].         Lung nodule R91.1   05/04/2021    Overview Signed 05/04/2021 12:29 AM by Sarina Ill, MD     03/2021 LDCT - Stable 4 mm posterolateral left lower lobe nodule. Lung-RADS category 2, Benign Appearance or Behavior; nodules with a very low likelihood of becoming a clinically active cancer due to size or lack of growth: Continue annual screening with LDCT in 12 months.         Encounter for screening and preventative care Z00.00   08/11/2021    Overview Signed 08/11/2021 11:30 AM by Herbert Seta E     Added automatically from request for surgery [phone]         Screen for colon cancer Z12.11   11/24/2021        Medical History   Diagnosis  Date    H/O Coronary CT Angiogram 10/2010    H/O echocardiogram 10/2014    H/O stress echocardiogram 02/2009    normal    H/O treadmill stress test 01/2008, 05/2010, 11/2015, 12/12/15    Hyperlipidemia     MANAGED WITH MEDS.    Hypertension     controlled w/med per pt    Sleep apnea     +OSA - USES CPAP NIGHTLY.     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 Always use your most recent med list.                atorvastatin 20 MG tablet  Take 1 tablet (20 mg) by  mouth daily Not currently taking due to interaction with a previously prescribed med for COVID-per pt he was instructed to start taking on 12/04/21  Commonly known as: LIPITOR  Medication Adjustments for Surgery: Take as prescribed     lisinopril 40 MG tablet  Take 1 tablet (40 mg) by mouth daily  Commonly known as: ZESTRIL  Medication Adjustments for Surgery: Take morning of surgery     polyethylene glycol w/ electrolytes oral solution  Follow instructions given by gastroenterology  Commonly known as: GOLYTELY  Medication Adjustments for Surgery: Take as prescribed            No Known Allergies  Family History   Problem Relation Age of Onset    Diverticulitis Mother     Cancer Father 5        Prostate, Bladder    Heart attack Father 52        4 MI's    Stroke Father         2 strokes    Hypertension Father     Diabetes Father     Hyperlipidemia Father     Heart disease Father     Myocardial Infarction Father     No known problems Sister     No known problems Brother     No known problems Son     No known problems Paternal Grandmother     COPD Paternal Grandfather     Heart attack Paternal Grandfather     Diabetes Paternal Grandfather     Appendicitis Son     Asthma Son     No known problems Son     Pancreatic cancer Paternal Uncle 17    Heart disease Paternal Grandparent     Diabetes Paternal Grandparent     Emphysema Paternal  Grandparent         pulmonary     Social History     Occupational History    Not on file   Tobacco Use    Smoking status: Former     Packs/day: 0.25     Years: 2.00     Total pack years: 0.50     Types: Cigarettes     Quit date: 03/05/2011     Years since quitting: 10.7    Smokeless tobacco: Former     Types: Snuff     Quit date: 05/31/2012    Tobacco comments:     Social use. 5-7 cig/week   Vaping Use    Vaping Use: Never used   Substance and Sexual Activity    Alcohol use: Not Currently     Comment: < weekly basis    Drug use: Never    Sexual activity: Not on file             Exam Scores:   SDB score Risk Category: OSA Diagnosed With PAP    PONV score Nausea Risk: LOW RISK    MST score      Allergy score      Frailty score         Visit Vitals  Ht 1.803 m (5\' 11" )   Wt 127 kg (280 lb)   BMI 39.05 kg/m       Recent Labs   CBC (last 180 days) 09/25/21  0815   WBC 5.90   RBC 4.89   Hgb 14.2   Hematocrit 42.5   MCV 86.9   MCH 29.0   MCHC 33.4   RDW 13   Platelets 217  MPV 10.4   Nucleated RBC 0.0   Absolute NRBC 0.00     Recent Labs   BMP (last 180 days) 08/04/21  0945 09/25/21  0815   Glucose 102* 101*   BUN 20 14.0   Creatinine 1.19 1.0   Sodium 140 138   Potassium 4.7 4.4   Chloride 106 106   CO2 26 25   Calcium 9.9 9.0   Anion Gap  --  7.0   EGFR  --  >60.0   eGFR 74  --          Recent Labs   Other (last 180 days) 08/04/21  0945 09/25/21  0815   TSH  --  3.16   ALT 41 48   AST (SGOT) 25 27   Protein, Total 6.9 6.5

## 2021-12-02 NOTE — Anesthesia Preprocedure Evaluation (Signed)
Relevant Problems   ANESTHESIA   (+) OSA (obstructive sleep apnea)      PULMONARY   (+) OSA (obstructive sleep apnea)      CARDIO   (+) HTN (hypertension)       PSS Anesthesia Comments: HTN< OSA, Cardiac stress test - [facility]    Pre-evaluation Note Incomplete - DO NOT USE FOR CLINICAL DECISIONS    Anesthesia Plan                                             Signed by: Juanell Fairly, MD 12/02/21 3:40 PM

## 2021-12-03 ENCOUNTER — Encounter
Admission: RE | Disposition: A | Discharge status: Home / Self Care | Payer: Self-pay | Source: Ambulatory Visit | Attending: Gastroenterology

## 2021-12-03 ENCOUNTER — Ambulatory Visit: Payer: No Typology Code available for payment source | Admitting: Pain Medicine

## 2021-12-03 ENCOUNTER — Ambulatory Visit: Payer: Self-pay

## 2021-12-03 ENCOUNTER — Ambulatory Visit
Admission: RE | Admit: 2021-12-03 | Discharge: 2021-12-03 | Disposition: A | Discharge status: Home / Self Care | Payer: No Typology Code available for payment source | Source: Ambulatory Visit | Attending: Gastroenterology | Admitting: Gastroenterology

## 2021-12-03 ENCOUNTER — Encounter: Payer: Self-pay | Admitting: Gastroenterology

## 2021-12-03 DIAGNOSIS — D123 Benign neoplasm of transverse colon: Secondary | ICD-10-CM

## 2021-12-03 DIAGNOSIS — K640 First degree hemorrhoids: Secondary | ICD-10-CM | POA: Insufficient documentation

## 2021-12-03 DIAGNOSIS — Z1211 Encounter for screening for malignant neoplasm of colon: Secondary | ICD-10-CM | POA: Insufficient documentation

## 2021-12-03 DIAGNOSIS — D122 Benign neoplasm of ascending colon: Secondary | ICD-10-CM | POA: Insufficient documentation

## 2021-12-03 HISTORY — PX: COLONOSCOPY: SHX174

## 2021-12-03 SURGERY — DONT USE, USE 1094-COLONOSCOPY, DIAGNOSTIC (SCREENING)
Anesthesia: Anesthesia General | Site: Abdomen

## 2021-12-03 MED ORDER — PROPOFOL 10 MG/ML IV EMUL (WRAP)
INTRAVENOUS | Status: DC | PRN
Start: 2021-12-03 — End: 2021-12-03
  Administered 2021-12-03 (×2): 20 mg via INTRAVENOUS
  Administered 2021-12-03: 80 mg via INTRAVENOUS
  Administered 2021-12-03 (×2): 20 mg via INTRAVENOUS
  Administered 2021-12-03: 80 mg via INTRAVENOUS
  Administered 2021-12-03 (×2): 20 mg via INTRAVENOUS
  Administered 2021-12-03: 80 mg via INTRAVENOUS

## 2021-12-03 MED ORDER — FENTANYL CITRATE (PF) 50 MCG/ML IJ SOLN (WRAP)
INTRAMUSCULAR | Status: AC
Start: 2021-12-03 — End: ?
  Filled 2021-12-03: qty 2

## 2021-12-03 MED ORDER — LACTATED RINGERS IV SOLN
INTRAVENOUS | Status: DC
Start: 2021-12-03 — End: 2021-12-03

## 2021-12-03 MED ORDER — LIDOCAINE HCL 2 % IJ SOLN
INTRAMUSCULAR | Status: DC | PRN
Start: 2021-12-03 — End: 2021-12-03
  Administered 2021-12-03: 100 mg

## 2021-12-03 MED ORDER — FENTANYL CITRATE (PF) 50 MCG/ML IJ SOLN (WRAP)
INTRAMUSCULAR | Status: DC | PRN
Start: 2021-12-03 — End: 2021-12-03
  Administered 2021-12-03: 100 ug via INTRAVENOUS

## 2021-12-03 MED ORDER — PROPOFOL 10 MG/ML IV EMUL (WRAP)
INTRAVENOUS | Status: AC
Start: 2021-12-03 — End: ?
  Filled 2021-12-03: qty 40

## 2021-12-03 SURGICAL SUPPLY — 46 items
CATHETER ELHMST HMGLD GLDPRB 7FR 300CM (Procedure Accessories)
CATHETER OD7 FR L300 CM BIPOLAR ROUND (Procedure Accessories) IMPLANT
CATHETER OD7 FR L300 CM BIPOLAR ROUND DISTAL TIP STANDARD CONNECTOR (Procedure Accessories) IMPLANT
ELECTRODE ADULT PATIENT RETURN L9 FT REM POLYHESIVE ACRYLIC FOAM (Procedure Accessories) IMPLANT
ELECTRODE PATIENT RETURN L9 FT VALLEYLAB (Procedure Accessories) IMPLANT
ELECTRODE PT RTN RM PHSV ACRL FM C30- LB (Procedure Accessories)
FORCEPS BIOPSY L240 CM JUMBO MICROMESH (Instrument) IMPLANT
FORCEPS BIOPSY L240 CM JUMBO MICROMESH TEETH STREAMLINE CATHETER (Instrument) IMPLANT
FORCEPS BIOPSY L240 CM LARGE CAPACITY (Instrument) ×1 IMPLANT
FORCEPS BIOPSY L240 CM MICROMESH TEETH STREAMLINE CATHETER NEEDLE (Instrument) ×1 IMPLANT
FORCEPS BX SS JMB RJ 4 2.8MM 240CM STRL (Instrument)
FORCEPS BX SS LG CPC RJ 4 2.4MM 240CM (Instrument) ×1
GLOVE EXAM LARGE NITRILE CHEMOTHERAPY POWDER FREE SENSE OATMEAL (Glove) ×2 IMPLANT
GLOVE EXAM NITRILE RESTORE LG (Glove) ×2 IMPLANT
GLV EXAM NITRILE RESTORE LG (Glove) ×4
GOWN SRG LG SMARTGOWN LF STRL LVL 4 (Gown) ×4 IMPLANT
GOWN SRGCL LG LEVEL 4 BRTHBL STRL LF DSPSBL SMARTGOWN (Gown) ×2 IMPLANT
MARKER ENDOSCOPIC PERMANENT INDICATION (Syringes, Needles) IMPLANT
MARKER ENDOSCOPIC PERMANENT INDICATION DARK SYRINGE SPOT EX 5 ML (Syringes, Needles) IMPLANT
MARKER ESCP 5ML SPOT EX PERM INDCT DRK (Syringes, Needles)
NEEDLE SCLEROTHERAPY CARR-LOCKE OD25 GA ODSEC2.5 MM L230 CM INJECTION (Needles) IMPLANT
NEEDLE SCLEROTHERAPY OD25 GA ODSEC2.5 MM (Needles) IMPLANT
NEEDLE SCLRTX SS TFLN CRLK 25GA 2.5MM (Needles)
PROBE ELECTROSURGICAL L220 CM FLEXIBLE (Procedure Accessories) IMPLANT
PROBE ELECTROSURGICAL L220 CM FLEXIBLE STRAIGHT FIRE OD2.3 MM FIAPC (Procedure Accessories) IMPLANT
PROBE ESURG FIAPC 2.3MM 220CM STRL FLXB (Procedure Accessories)
SNARE ESCP MED OVL SNS 2.4MM 240CM STRL (Procedure Accessories)
SNARE ESCP MIC CPTVTR 13MM 240IN STRL (GE Lab Supplies) IMPLANT
SNARE ESCP SM OVL SNS 2.4MM 240CM STRL (Endoscopic Supplies) ×1
SNARE ESCP SPRL SNAREMASTER 2.8MM 20MM (Procedure Accessories)
SNARE MEDIUM OVAL L240 CM OD2.4 MM (Procedure Accessories) IMPLANT
SNARE MEDIUM OVAL L240 CM OD2.4 MM SENSATION LOOP SHORT THROW FLEXIBLE (Procedure Accessories) IMPLANT
SNARE SMALL HEXAGON CAPTIVATOR STIFF ENDOSCOPIC POLYPECTOMY (GE Lab Supplies) IMPLANT
SNARE SMALL OVAL L240 CM OD2.4 MM (Endoscopic Supplies) ×1 IMPLANT
SNARE SMALL OVAL L240 CM OD2.4 MM SENSATION LOOP SHORT THROW FLEXIBLE (Endoscopic Supplies) ×1 IMPLANT
SNARE SPIRAL 230CM 2.8MM 20MM SNRMSTR RDG WIRE ENDOSCOPIC POLYPECTOMY (Procedure Accessories) IMPLANT
SNARE SPIRAL L230 CM OD2.8 MM ODSEC20 MM (Procedure Accessories) IMPLANT
SPONGE GAUZE L4 IN X W4 IN 16 PLY (Dressing) ×1 IMPLANT
SPONGE GAUZE L4 IN X W4 IN 16 PLY MAXIMUM ABSORBENT USP TYPE VII (Dressing) ×1 IMPLANT
SPONGE GZE CTTN CRTY 4X4IN LF NS 16 PLY (Dressing) ×1
SYRINGE 50 ML GRADUATE NONPYROGENIC DEHP (Syringes, Needles) IMPLANT
SYRINGE 50 ML GRADUATE NONPYROGENIC DEHP FREE PVC FREE BD MEDICAL (Syringes, Needles) IMPLANT
SYRINGE MED 50ML LF STRL GRAD N-PYRG (Syringes, Needles)
TRAP MCS PLS LF STRL SCR CAP TUBE ID LBL (Procedure Accessories) ×1
TRAP MUCUS SCREW CAP TUBE ID LABEL (Procedure Accessories) ×1 IMPLANT
TRAP MUCUS SCREW CAP TUBE ID LABEL MEDLINE PLASTIC CLEAR (Procedure Accessories) ×1 IMPLANT

## 2021-12-03 NOTE — Transfer of Care (Signed)
Anesthesia Transfer of Care Note    Patient: Guy Martinez    Procedures performed: Procedure(s):  COLONOSCOPY    Anesthesia type: General TIVA    Patient location:Phase II PACU    Last vitals:   Vitals:    12/03/21 0726   BP: (!) 151/94   Pulse: 75   Resp: 18   Temp: 36.8 C (98.3 F)   SpO2: 96%       Post pain: Patient not complaining of pain, continue current therapy      Mental Status:awake    Respiratory Function: tolerating room air    Cardiovascular: stable    Nausea/Vomiting: patient not complaining of nausea or vomiting    Hydration Status: adequate    Post assessment: no apparent anesthetic complications    Signed by: Reva Bores, CRNA  12/03/21 9:13 AM

## 2021-12-03 NOTE — Discharge Instr - AVS First Page (Addendum)
Endoscopy Discharge Instructions  General Instructions:  1. Following sedation, your judgement, perception, and coordination are considered impaired. Even though you may feel awake and alert, you are considered legally intoxicated. Therefore, until the next morning;  Do not Drive  Do not operate appliances or equipment that requires reaction time (e.g. stove, electrical tools, machinery)  Do not sign legal documents or be involved in important decisions.  Do not smoke if alone  Do not drink alcoholic beverages  Go directly home and rest for several hours before resuming your routine activities.  It is highly recommended to have a responsible adult stay with you for the next 24 hours    2. Tenderness, swelling or pain may occur at the IV site where you received sedation. If you experience this, apply warm soaks to the area. Notify your physician if this persists.    Instructions Specific To Procedures - Report To Physician Any Of The Following:  Colon/Sigmoidoscopy/Proctoscopy   1. Severe and persistent abdominal pain/bloating which does not subside within   2-3 hours   2. Large amount of rectal bleeding (some mucosal blood streaking may occur,   especially if biopsy or polypectomy was done or if hemorrhoids are present.   3. Nausea/vomitting   4. Fevers/Chills within 24 hours after procedure. Temp>101deg F     In Addition:   If polyp has been removed, DO NOT take aspirin or aspirin containing products   (e.g. Anacin, Alka Seltzer, Bufferin, Etc.) or non-steroidal anti-inflammatory drugs   (e.g. Advil, Motrin, etc.) for 2-3 days unless otherwise advised by doctor. Tylenol   or extra Strength Tylenol is permitted.    Additional Discharge Instructions  Your diet after the procedure: Avoid heavy/fatty/greasy/spicy/fried food for 24 hours.  Start with easily digestible food like soup/crackers.        If you have questions or problems contact your MD immediately. If you need immediate attention, call your MD, 911 and/or  go to nearest emergency room.

## 2021-12-03 NOTE — Anesthesia Postprocedure Evaluation (Signed)
Anesthesia Post Evaluation    Patient: Guy Martinez    Procedure(s):  COLONOSCOPY    Anesthesia type: general    Last Vitals:   Vitals Value Taken Time   BP 119/79 12/03/21 0935   Temp  12/03/21 1113   Pulse 68 12/03/21 0935   Resp  12/03/21 1113   SpO2 97 % 12/03/21 0935                 Anesthesia Post Evaluation:     Patient Evaluated: PACU  Patient Participation: complete - patient participated  Level of Consciousness: awake  Pain Score: 0  Pain Management: adequate    Airway Patency: patent    Anesthetic complications: No      PONV Status: none    Cardiovascular status: stable  Respiratory status: room air  Hydration status: stable        Signed by: Juanell Fairly, MD, 12/03/2021 11:13 AM

## 2021-12-03 NOTE — H&P (Signed)
GI PRE PROCEDURE NOTE    Proceduralist Comments:   Review of Systems and Past Medical / Surgical History performed: Yes     Indications:Colon cancer screening    Previous Adverse Reaction to Anesthesia or Sedation (if yes, describe): No    Physical Exam / Laboratory Data (If applicable)   Airway Classification: Class II    General: Alert and cooperative  Lungs: Lungs clear to auscultation  Cardiac: RRR, normal S1S2.    Abdomen: Soft, non tender. Normal active bowel sounds  Other:     No labs drawn    American Society of Anesthesiologists (ASA) Physical Status Classification:   ASA 3 - Patient with moderate systemic disease with functional limitations    Planned Sedation:   Deep sedation with anesthesia    Attestation:   Maciah Schweigert has been reassessed immediately prior to the procedure and is an appropriate candidate for the planned sedation and procedure. Risks, benefits and alternatives to the planned procedure and sedation have been explained to the patient or guardian:  yes        Signed by: Denny Levy, MD

## 2021-12-04 ENCOUNTER — Encounter: Payer: Self-pay | Admitting: Gastroenterology

## 2021-12-07 ENCOUNTER — Encounter: Payer: Self-pay | Admitting: Internal Medicine

## 2021-12-07 DIAGNOSIS — J069 Acute upper respiratory infection, unspecified: Secondary | ICD-10-CM

## 2021-12-07 MED ORDER — AZITHROMYCIN 250 MG PO TABS
ORAL_TABLET | ORAL | 0 refills | Status: AC
Start: 2021-12-07 — End: 2021-12-12

## 2021-12-07 NOTE — Progress Notes (Signed)
Called in azithromycin for possible URI.  Recent COVID infection.  Low suspicion for reinfection.  Low suspicion for flu or strep throat.

## 2021-12-08 LAB — LAB USE ONLY - HISTORICAL SURGICAL PATHOLOGY

## 2021-12-09 ENCOUNTER — Encounter: Payer: Self-pay | Admitting: Internal Medicine

## 2021-12-10 ENCOUNTER — Ambulatory Visit
Admission: RE | Admit: 2021-12-10 | Discharge: 2021-12-10 | Disposition: A | Discharge status: Home / Self Care | Payer: No Typology Code available for payment source | Source: Ambulatory Visit | Attending: Internal Medicine | Admitting: Internal Medicine

## 2021-12-10 DIAGNOSIS — Z005 Encounter for examination of potential donor of organ and tissue: Secondary | ICD-10-CM | POA: Insufficient documentation

## 2021-12-10 LAB — TYPE AND SCREEN
AB Screen Gel: NEGATIVE
ABO Rh: O NEG

## 2021-12-18 ENCOUNTER — Encounter (INDEPENDENT_AMBULATORY_CARE_PROVIDER_SITE_OTHER): Payer: Self-pay

## 2021-12-29 ENCOUNTER — Telehealth: Payer: Self-pay | Admitting: Internal Medicine

## 2021-12-29 ENCOUNTER — Encounter: Payer: Self-pay | Admitting: Internal Medicine

## 2021-12-29 DIAGNOSIS — M19112 Post-traumatic osteoarthritis, left shoulder: Secondary | ICD-10-CM

## 2021-12-29 NOTE — Telephone Encounter (Signed)
Patient calling in report left shoulder pain.  It has been bothering him for the past 4 to 5 weeks with pain above 90 degrees of abduction but on Thursday, August 27 patient was playing slam ball when he dove and struck his left shoulder with his arm against his body.  Since then has had worsening pain and unable to go above 90 degrees.  Pain is located on the lateral aspect of the shoulder.    Recommended patient follow-up directly with sports medicine.  Order has been placed.

## 2022-01-08 ENCOUNTER — Encounter (INDEPENDENT_AMBULATORY_CARE_PROVIDER_SITE_OTHER): Payer: Self-pay | Admitting: Sports Medicine

## 2022-01-08 ENCOUNTER — Ambulatory Visit (INDEPENDENT_AMBULATORY_CARE_PROVIDER_SITE_OTHER): Payer: No Typology Code available for payment source

## 2022-01-08 ENCOUNTER — Ambulatory Visit (INDEPENDENT_AMBULATORY_CARE_PROVIDER_SITE_OTHER): Payer: No Typology Code available for payment source | Admitting: Sports Medicine

## 2022-01-08 VITALS — BP 158/101 | HR 59

## 2022-01-08 DIAGNOSIS — S4992XA Unspecified injury of left shoulder and upper arm, initial encounter: Secondary | ICD-10-CM

## 2022-01-08 DIAGNOSIS — S46002S Unspecified injury of muscle(s) and tendon(s) of the rotator cuff of left shoulder, sequela: Secondary | ICD-10-CM

## 2022-01-08 DIAGNOSIS — M19112 Post-traumatic osteoarthritis, left shoulder: Secondary | ICD-10-CM

## 2022-01-08 MED ORDER — METHYLPREDNISOLONE 4 MG PO TBPK
ORAL_TABLET | ORAL | 0 refills | Status: AC
Start: 2022-01-08 — End: 2022-01-14

## 2022-01-08 NOTE — Progress Notes (Unsigned)
[facility]    Provider: Valera Castle, MD  Date of Exam:  01/08/2022   Patient:  Guy Martinez  DOB:  12-30-1970    AGE:  51 y.o.  MR#:  [id number]     Chief Complaint: Left shoulder pain.    HPI:  Guy Martinez is a pleasant 51 y.o.-year-old right  hand dominant male who presents today with a history of left shoulder pain. Guy Martinez has been having pain for the last 2 months after his kids jumped on top of him. he rates the pain at a 4/10 on today's visit. He denies any antecedent history of injury. His pain is localized to the anterior shoulder. His pain is made worse with internal rotation, reaching.  Guy Martinez symptoms are improved with rest. He has tried Ibuprofen/Tylenol with little improvement.  He reports moderate changes in strength. He has attempted modification of activity with little improvement. Guy Martinez is now here for further evaluation and discussion of treatment options.    For other past medical history, social history, family history and past surgical history please see them listed below.    Club/School/Work Affiliation: none    Problem List:   Patient Active Problem List   Diagnosis    HLD (hyperlipidemia)    Morbid obesity    HTN (hypertension)    Actinic keratosis    Sebaceous cyst    Body mass index 45.0-49.9, adult    Alopecia    Disorder of hair follicle    Changes in skin texture    Melanocytic nevi of trunk    Melanocytic nevi, unspecified    Other disorders of skin and subcutaneous tissue in diseases classified elsewhere    Tinea pedis    Seborrheic keratosis    Vitamin D deficiency    OSA (obstructive sleep apnea)    Lung nodule    Encounter for screening and preventative care        Past Medical History:    Past Medical History:   Diagnosis Date    H/O Coronary CT Angiogram 10/2010    H/O echocardiogram 10/2014    H/O stress echocardiogram 02/2009    normal    H/O treadmill stress test 01/2008, 05/2010, 11/2015, 12/12/15    Hyperlipidemia     MANAGED WITH MEDS.    Hypertension     controlled  w/med per pt    Sleep apnea     +OSA - USES CPAP NIGHTLY.       Social History:   Social History     Tobacco Use    Smoking status: Former     Packs/day: 0.25     Years: 2.00     Total pack years: 0.50     Types: Cigarettes     Quit date: 03/05/2011     Years since quitting: 10.8    Smokeless tobacco: Former     Types: Snuff     Quit date: 05/31/2012    Tobacco comments:     Social use. 5-7 cig/week   Vaping Use    Vaping Use: Never used   Substance Use Topics    Alcohol use: Not Currently     Comment: < weekly basis    Drug use: Never       Family History:   Family History   Problem Relation Age of Onset    Diverticulitis Mother     Cancer Father 57        Prostate, Bladder    Heart attack Father 74  4 MI's    Stroke Father         2 strokes    Hypertension Father     Diabetes Father     Hyperlipidemia Father     Heart disease Father     Myocardial Infarction Father     No known problems Sister     No known problems Brother     No known problems Son     No known problems Paternal Grandmother     COPD Paternal Grandfather     Heart attack Paternal Grandfather     Diabetes Paternal Grandfather     Appendicitis Son     Asthma Son     No known problems Son     Pancreatic cancer Paternal Uncle 69    Heart disease Paternal Grandparent     Diabetes Paternal Grandparent     Emphysema Paternal Grandparent         pulmonary       Past Surgical History:    Past Surgical History:   Procedure Laterality Date    COLONOSCOPY N/A 12/03/2021    Repeat 5 years. Tubular Adenoma;  Surgeon: Denny Levy, MD;  Location: [facility];  Service: Gastroenterology;    WISDOM TOOTH EXTRACTION         Medications:  Scheduled Meds:  Continuous Infusions:  PRN Meds:.    Current Outpatient Medications:     atorvastatin (LIPITOR) 20 MG tablet, Take 1 tablet (20 mg) by mouth daily Not currently taking due to interaction with a previously prescribed med for COVID-per pt he was instructed to start taking on 12/04/21, Disp: , Rfl:     lisinopril  (ZESTRIL) 40 MG tablet, Take 1 tablet (40 mg) by mouth daily (Patient taking differently: Take 1 tablet (40 mg) by mouth every morning), Disp: 90 tablet, Rfl: 3    polyethylene glycol w/ electrolytes (GOLYTELY) oral solution, Follow instructions given by gastroenterology, Disp: 4000 mL, Rfl: 0     Allergies:  No Known Allergies    ROS:  Constitutional: No fatigue, fever, weight loss, or weight gain.   Ears, Nose, Mouth & Throat: No sore throat or hearing loss.   Cardiovascular: No chest pain, blood clots, or leg cramps.   Respiratory: No shortness of breath, cough, or difficulty breathing.   Gastrointestinal: No nausea, vomiting, diarrhea, or loss of appetite.   Genitourinary: No polyuria or kidney disease.   Musculoskeletal: No joint aches, muscle weakness or swelling of joints/body parts other than that mentioned above.  Integumentary: No finger nail changes or skin dryness.   Neurological: No numbness, burning discomfort, or headaches.   Psychiatric: No depression or anxiety.   Endocrine: No increased thirst, change in appetite or thyroid disease.   Hematologic/Lymphatic: No easy bruising or anemia.     Exam:   Left Shoulder:  Skin Intact  Erythema None present  Swelling None present  Atrophy No identifiable  TTP at ACJ None  TTP at Biceps groove None  TTP Lateral/GT None  Other Areas of TTP None    Active ROM:   Forward Flexion 170  Abduction 170    Passive ROM:  Forward Flexion 170  ER at side 70  IR at Side T10  Scapular Dyskinesis Negative    Strength:  Supraspinatus 5/5  Infraspinatus 5/5  Subscapularis 5/5    Labral/Biceps:  Obrien's Test: Negative  Speed's Test: Negative  Dynamic Labral Shear Test: Negative  Crank Test: Negative  Anterior Apprehension Test: Negative  Anterior  Relocation Test: Negative  Posterior Apprehension Test: Negative  Jerk Test: Negative    Other:  Neer Sign: Negative  Hawkin's Sign: Negative    Radial Pulse 2+  DTR and Pathological Reflexes Intact  No  lymphadenopathy  Radial/Median/Ulnar Nerves intact to motor and sensory provocation  Hand Perfused with CR <2 sec    Right Shoulder:  Skin Intact  Erythema None present  Swelling None present  Atrophy No identifiable  TTP at ACJ None  TTP at Biceps groove None  TTP Lateral/GT None  Other Areas of TTP None    Active ROM:   Forward Flexion 170  Abduction 170    Passive ROM:  Forward Flexion 170  ER at side 70  IR at Side T10  Scapular Dyskinesis Negative    Strength:  Supraspinatus 5/5  Infraspinatus 5/5  Subscapularis 5/5    Labral/Biceps:  Obrien's Test: Negative  Speed's Test: Negative  Dynamic Labral Shear Test: Negative  Crank Test: Negative  Anterior Apprehension Test: Negative  Anterior Relocation Test: Negative  Posterior Apprehension Test: Negative  Jerk Test: Negative    Other:  Neer Sign: Negative  Hawkin's Sign: Negative    Radial Pulse 2+  DTR and Pathological Reflexes Intact  No lymphadenopathy  Radial/Median/Ulnar Nerves intact to motor and sensory provocation  Hand Perfused with CR <2 sec    Vitals: BP (!) 158/101 Comment: coffee  Pulse (!) 59     General: Guy Martinez was awake, alert, oriented, easily engaged, displayed logical thinking with clear speech and was neat in appearance. His general appearance was normal, well-developed and well-nourished.    Gait: The patient demonstrated a non-antalgic gait with intact coordination and balance.     Studies: AP, Y and Axillary views of the left shoulder were ordered and reviewed on today's visit. They reveal Normal alignment GH and AC joints. adequate subacromial space. mild sclerotic changes of the GH and AC joints.  mild narrowing of the AC joint.  Type 2  acromion on Y view.  Normal alignment and coracoid on Axillary view.  There no acute bony abnormalities.    Assessment/Plan: Guy Martinez is a pleasant 51 y.o. male with historical and clinical evidence of left shoulder rotator cuff tendinopathy impingement rule out rotator cuff tear..  The patient's diagnosis and  treatment options were discussed in detail at today's visit. I have recommended an MRI for further evaluation and diagnosis. Guy Martinez will follow up in our clinic for review of the MRI at the next earliest available.  I given him a Medrol Dosepak as well to help with his discomfort I have asked the patient to avoid activities that exacerbate or worsen his symptoms.  We reviewed the standard conservative and surgical management of his diagnosis as well as the use of medications such as Tylenol and NSAIDS.  We discussed the limited role of injection therapy in management and its potential role in diagnosis and short term pain relief.  The potential need for surgical intervention was described in detail including the procedure, risks, rehab and recovery.  All questions were answered to the patient's satisfaction.  The patient will notify my clinic of any changes or worsening of their symptoms during the interim.  We will plan on follow up after MRI.     Elements of MDM:   Based on the below 2023 Evaluation and Management Time Requirements, the appropriate level of service is 99204 based on the 46 minutes spent face-to-face with the patient along with the non-face-to-face time spent by  me performing the following highlighted activities:      Preparing for the visit (such as reviewing tests)  Getting and/or reviewing a history that was separately obtained  Performing the exam  Counseling and providing education to the patient, family, or caregiver  Documenting information in the medical record  Interpreting results and sharing that information with the patient, family or caregiver  Ordering medicines, tests, or procedures  Communicating with other healthcare professionals  Care coordination           I, Neldon Newport, LAT, ATC, OTC, was acting as a Neurosurgeon for provider Valera Castle, MD on patient Guy Martinez      The review of the patient's medications does not in any way constitute an endorsement, by this clinician,  of  their use, dosage, indications, route, efficacy, interactions, or other clinical parameters.    This note may have been generated in part or in whole within the [employer] EMR using Dragon medical speech recognition software and may contain inherent errors or omissions not intended by the user. Grammatical and punctuation errors, random word insertions, deletions, pronoun errors and incomplete sentences are occasional consequences of this technology due to software limitations. Not all errors are caught or corrected.  Although every attempt is made to root out erroneus and incomplete transcription, the note may still not fully represent the intent or opinion of the author. If there are questions or concerns about the content of this note or information contained within the body of this dictation they should be addressed directly with the author for clarification.    Neldon Newport ATC, acted as a Neurosurgeon for this encounter for Dr.  Arsenio Loader. I have reviewed and edited this note when appropriate and agree with the documentation

## 2022-01-18 ENCOUNTER — Encounter (INDEPENDENT_AMBULATORY_CARE_PROVIDER_SITE_OTHER): Payer: No Typology Code available for payment source | Admitting: Physician Assistant

## 2022-01-20 ENCOUNTER — Ambulatory Visit: Payer: No Typology Code available for payment source

## 2022-02-04 NOTE — Progress Notes (Deleted)
SLEEP MEDICINE OFFICE PROGRESS NOTE    [facility]OLOGY  [address]  [place] [address]  Dept: [phone]  Dept Fax: [phone]       Patient Name: Guy Martinez    Date of Visit:  February 04, 2022  Date of Birth: 12-17-1970  AGE: 51 y.o.  Medical Record #: [id number]  Requesting Physician: Sarina Ill, MD    CHIEF COMPLAINT:  No chief complaint on file.         HISTORY OF PRESENT ILLNESS:    Guy Martinez is a pleasant 51 y.o. male who presents for sleep medicine follow up. Past medical history significant for hypertension, hyperlipidemia, elevated BMI, and moderate obstructive sleep apnea as demonstrated on HST 03/27/2021 with an AHI of 27.3 and an O2 nadir of 66%.     At last office visit 07/24/2021 with me, patient reported compliance with PAP therapy and benefit from device use.  He was encouraged to continue consistent use of the device for best benefit.     Compliance report ***06/24/2021 - 07/23/2021 shows 77% compliance with an average use of greater than 7 hours each night.  AHI is well-controlled at 0.7.  No significant leak.     Doing well with PAP therapy and reports benefit in sleep quality with device use.  Feels current *** mask is comfortable and denies any issue with leak.  No issue with current pressure settings or aerophagia.  The patient is receiving supplies regularly from *** DME and cleaning the device by hand as recommended.    PAST MEDICAL HISTORY: He has a past medical history of H/O Coronary CT Angiogram (10/2010), H/O echocardiogram (10/2014), H/O stress echocardiogram (02/2009), H/O treadmill stress test (01/2008, 05/2010, 11/2015, 12/12/15), Hyperlipidemia, Hypertension, and Sleep apnea. He has a past surgical history that includes Wisdom tooth extraction and Colonoscopy (N/A, 12/03/2021).    ALLERGIES:   No Known Allergies    MEDICATIONS:   Patient's current medications were reviewed. ONLY Sleep Medicine related  medications were updated unless others were addressed in assessment and plan.  Current Outpatient Medications   Medication Instructions    atorvastatin (LIPITOR) 20 mg, Oral, Daily, Not currently taking due to interaction with a previously prescribed med for COVID-per pt he was instructed to start taking on 12/04/21    lisinopril (ZESTRIL) 40 mg, Oral, Daily    polyethylene glycol w/ electrolytes (GOLYTELY) oral solution Follow instructions given by gastroenterology       FAMILY HISTORY: family history includes Appendicitis in his son; Asthma in his son; COPD in his paternal grandfather; Cancer (age of onset: 63) in his father; Diabetes in his father, paternal grandfather, and paternal grandparent; Diverticulitis in his mother; Emphysema in his paternal grandparent; Heart attack in his paternal grandfather; Heart attack (age of onset: 80) in his father; Heart disease in his father and paternal grandparent; Hyperlipidemia in his father; Hypertension in his father; Myocardial Infarction in his father; No known problems in his brother, paternal grandmother, sister, son, and son; Pancreatic cancer (age of onset: 10) in his paternal uncle; Stroke in his father.    SOCIAL HISTORY: He reports that he quit smoking about 10 years ago. His smoking use included cigarettes. He has a 0.50 pack-year smoking history. He quit smokeless tobacco use about 9 years ago.  His smokeless tobacco use included snuff. He reports that he does not currently use alcohol. He reports that he does not use drugs.  PHYSICAL EXAM:    There were no vitals taken for this visit.     Constitutional: Cooperative, alert and oriented,well developed, well nourished, in no acute distress.,   Head: Normocephalic, normal hair pattern  Eyes: conjunctivae and lids normal   Chest: Normal respiration without extremus   Neurological: No gross motor or sensory deficits noted  Psych: affect appropriate, oriented to time, person and place.      LABS:   Lab Results    Component Value Date    WBC 5.90 09/25/2021    HGB 14.2 09/25/2021    HCT 42.5 09/25/2021    PLT 217 09/25/2021     No results found for: "IRON", "TIBC", "FERRITIN"  Lab Results   Component Value Date    TSH 3.16 09/25/2021         IMPRESSION:     Obstructive Sleep Apnea (moderate): Using and benefiting from PAP therapy.              - Reported symptoms of snoring, poor sleep quality, and daytime fatigue. Previous ESS 12.              - HST 03/27/2021 with an AHI of 27.3 and an O2 nadir of 66%. Wt 280 LBS.              - Device: AS10 APAP 4-16 cm H2O (initiated 04/13/2021)              - Mask: [id number], well tolerated. Failed to tolerate oronasal mask.              - DME: [facility]              - CR: ***06/24/2021 - 07/23/2021 shows 77% compliance with an average use of greater than 7 hours each night.  AHI is well-controlled at 0.7. ESS 5.     Hypertension   -Reviewed pathology of sleep apnea as it relates to the cardiovascular system and how treating moderate to severe sleep apnea in particular can help confer reduced cardiovascular risks.   -Following with [facility] cardiology team.   -Cardiology notes reviewed.     Elevated BMI              -Carrying extra weight on your frame can contribute to the severity of OSA. Weight loss encouraged.    RECOMMENDATIONS:    Prior office notes, prior testing, and compliance data reviewed today. Patient is using and benefiting from PAP therapy. The patient was encouraged to wear the device with every hour of sleep for best benefit. No indication for pressure adjustment or further testing at this time.   Device cleaning/maintenance reviewed.  CMN updated today.  Continue to work directly with the DME team to improve supplies/mask experience.  Return for annual compliance, sooner if needed.                                               No orders of the defined types were placed in this encounter.        No orders of the defined types were placed in this encounter.        Avoid  driving or engaging in other activities requiring sustained vigilance if feeling sleepy.      SIGNED:    Luiz Blare, PA        This note was generated  by the Dragon speech recognition and may contain errors or omissions not intended by the user. Grammatical errors, random word insertions, deletions, pronoun errors, and incomplete sentences are occasional consequences of this technology due to software limitations. Not all errors are caught or corrected. If there are questions or concerns about the content of this note or information contained within the body of this dictation, they should be addressed directly with the author for clarification.

## 2022-02-08 ENCOUNTER — Encounter (INDEPENDENT_AMBULATORY_CARE_PROVIDER_SITE_OTHER): Payer: Self-pay | Admitting: Physician Assistant

## 2022-02-09 ENCOUNTER — Encounter (INDEPENDENT_AMBULATORY_CARE_PROVIDER_SITE_OTHER): Payer: No Typology Code available for payment source | Admitting: Physician Assistant

## 2022-02-09 ENCOUNTER — Encounter (INDEPENDENT_AMBULATORY_CARE_PROVIDER_SITE_OTHER): Payer: Self-pay | Admitting: Physician Assistant

## 2022-02-09 ENCOUNTER — Other Ambulatory Visit: Payer: Self-pay | Admitting: Internal Medicine

## 2022-02-09 DIAGNOSIS — E559 Vitamin D deficiency, unspecified: Secondary | ICD-10-CM

## 2022-02-09 DIAGNOSIS — E7800 Pure hypercholesterolemia, unspecified: Secondary | ICD-10-CM

## 2022-02-09 DIAGNOSIS — I10 Essential (primary) hypertension: Secondary | ICD-10-CM

## 2022-02-09 DIAGNOSIS — Z6841 Body Mass Index (BMI) 40.0 and over, adult: Secondary | ICD-10-CM

## 2022-02-09 DIAGNOSIS — Z0000 Encounter for general adult medical examination without abnormal findings: Secondary | ICD-10-CM

## 2022-02-09 DIAGNOSIS — G4733 Obstructive sleep apnea (adult) (pediatric): Secondary | ICD-10-CM

## 2022-02-09 NOTE — Progress Notes (Unsigned)
SLEEP MEDICINE OFFICE PROGRESS NOTE    [facility] [place] OFFICE -CARDIOLOGY  [address]  [place] [address]  Dept: [phone]  Dept Fax: [phone]       Patient Name: Guy Martinez    Date of Visit:  February 10, 2022  Date of Birth: [birth year]  AGE: 51 y.o.  Medical Record #: [id number]  Requesting Physician: Sarina Ill, MD    CHIEF COMPLAINT:  Follow-up (Compliance)         HISTORY OF PRESENT ILLNESS:    Guy Martinez is a pleasant 51 y.o. male who presents for sleep medicine follow up. Past medical history significant for hypertension, hyperlipidemia, elevated BMI, and moderate obstructive sleep apnea as demonstrated on HST 03/27/2021 with an AHI of 27.3 and an O2 nadir of 66%.     At last office visit 07/24/2021 with me, patient was compliant with PAP therapy and benefiting from device use.  Continued consistent use of the device encouraged for best benefit.     Compliance report 01/09/2022 - 02/07/2022 shows 80% compliance with an average use of just under 7 hours each night.  AHI 0.4.  Acceptable leak.     Doing well with PAP therapy and reports benefit in sleep quality with device use.  Feels current FFM mask is comfortable and denies any issue with leak.  No issue with current pressure settings or aerophagia.  The patient is receiving supplies regularly from [facility] and cleaning the device by hand as recommended.    PAST MEDICAL HISTORY: He has a past medical history of H/O Coronary CT Angiogram (10/2010), H/O echocardiogram (10/2014), H/O stress echocardiogram (02/2009), H/O treadmill stress test (01/2008, 05/2010, 11/2015, 12/12/15), Hyperlipidemia, Hypertension, and Sleep apnea. He has a past surgical history that includes Wisdom tooth extraction and Colonoscopy (N/A, 12/03/2021).    ALLERGIES:   No Known Allergies    MEDICATIONS:   Patient's current medications were reviewed. ONLY Sleep Medicine related medications were updated unless others were addressed  in assessment and plan.  Current Outpatient Medications   Medication Instructions    atorvastatin (LIPITOR) 20 mg, Oral, Daily, Not currently taking due to interaction with a previously prescribed med for COVID-per pt he was instructed to start taking on 12/04/21    lisinopril (ZESTRIL) 40 mg, Oral, Daily    polyethylene glycol w/ electrolytes (GOLYTELY) oral solution Follow instructions given by gastroenterology       FAMILY HISTORY: family history includes Appendicitis in his son; Asthma in his son; COPD in his paternal grandfather; Cancer (age of onset: 27) in his father; Diabetes in his father, paternal grandfather, and paternal grandparent; Diverticulitis in his mother; Emphysema in his paternal grandparent; Heart attack in his paternal grandfather; Heart attack (age of onset: 92) in his father; Heart disease in his father and paternal grandparent; Hyperlipidemia in his father; Hypertension in his father; Myocardial Infarction in his father; No known problems in his brother, paternal grandmother, sister, son, and son; Pancreatic cancer (age of onset: 9) in his paternal uncle; Stroke in his father.    SOCIAL HISTORY: He reports that he quit smoking about 10 years ago. His smoking use included cigarettes. He has a 0.50 pack-year smoking history. He quit smokeless tobacco use about 9 years ago.  His smokeless tobacco use included snuff. He reports that he does not currently use alcohol. He reports that he does not use drugs.      PHYSICAL EXAM:    Visit Vitals  BP 118/78 (  BP Site: Left arm, Patient Position: Sitting, Cuff Size: Large)   Pulse 70   Ht 1.803 m (5\' 11" )   Wt 124.7 kg (275 lb)   SpO2 98%   BMI 38.35 kg/m        Constitutional: Cooperative, alert and oriented,well developed, well nourished, in no acute distress.,   Head: Normocephalic, normal hair pattern  Eyes: conjunctivae and lids normal   Chest: Normal respiration without extremus   Neurological: No gross motor or sensory deficits noted  Psych:  affect appropriate, oriented to time, person and place.      LABS:   Lab Results   Component Value Date    WBC 5.90 09/25/2021    HGB 14.2 09/25/2021    HCT 42.5 09/25/2021    PLT 217 09/25/2021     No results found for: "IRON", "TIBC", "FERRITIN"  Lab Results   Component Value Date    TSH 3.16 09/25/2021         IMPRESSION:     Obstructive Sleep Apnea (moderate): Using and benefiting from PAP therapy.              - Reported symptoms of snoring, poor sleep quality, and daytime fatigue. Previous ESS 12.              - Sleep Testing:  >HST 03/27/2021 with an AHI of 27.3 and an O2 nadir of 66%. Wt 280 LBS.              - Device: AS10 APAP 4-16 cm H2O (initiated 04/13/2021)              - Mask: [id number], well tolerated. Failed to tolerate oronasal mask.              - DME: [facility]              - CR: 01/09/2022 - 02/07/2022 shows 80% compliance with an average use of just under 7 hours each night.  AHI 0.4. ESS 5.     Hypertension   -Reviewed pathology of sleep apnea as it relates to the cardiovascular system and how treating moderate to severe sleep apnea in particular can help confer reduced cardiovascular risks.   -Following with [facility] cardiology team.   -Cardiology notes reviewed.     Elevated BMI              -Carrying extra weight on your frame can contribute to the severity of OSA. Weight loss encouraged.    RECOMMENDATIONS:    Prior office notes, prior testing, and compliance data reviewed today. Patient is using and benefiting from PAP therapy. The patient was encouraged to wear the device with every hour of sleep for best benefit. No indication for pressure adjustment or further testing at this time.   Device cleaning/maintenance reviewed.  CMN updated today.  Continue to work directly with the DME team to improve supplies/mask experience.  Return for annual compliance, sooner if needed.                                                 Orders Placed This Encounter   Procedures    Sleep APP Visit ([facility] [place])          No orders of the defined types were placed in this encounter.        Avoid driving or  engaging in other activities requiring sustained vigilance if feeling sleepy.      SIGNED:    Luiz Blare, PA        This note was generated by the Dragon speech recognition and may contain errors or omissions not intended by the user. Grammatical errors, random word insertions, deletions, pronoun errors, and incomplete sentences are occasional consequences of this technology due to software limitations. Not all errors are caught or corrected. If there are questions or concerns about the content of this note or information contained within the body of this dictation, they should be addressed directly with the author for clarification.

## 2022-02-10 ENCOUNTER — Ambulatory Visit (INDEPENDENT_AMBULATORY_CARE_PROVIDER_SITE_OTHER): Payer: No Typology Code available for payment source | Admitting: Physician Assistant

## 2022-02-10 ENCOUNTER — Encounter (INDEPENDENT_AMBULATORY_CARE_PROVIDER_SITE_OTHER): Payer: Self-pay | Admitting: Physician Assistant

## 2022-02-10 VITALS — BP 118/78 | HR 70 | Ht 71.0 in | Wt 275.0 lb

## 2022-02-10 DIAGNOSIS — I10 Essential (primary) hypertension: Secondary | ICD-10-CM

## 2022-02-10 DIAGNOSIS — Z6839 Body mass index (BMI) 39.0-39.9, adult: Secondary | ICD-10-CM

## 2022-02-10 DIAGNOSIS — G4733 Obstructive sleep apnea (adult) (pediatric): Secondary | ICD-10-CM

## 2022-02-10 NOTE — Patient Instructions (Signed)
GENERAL SUGGESTIONS FOR BETTER SLEEP     1. KEEP REGULAR HOURS-   The best way to ensure a good night of sleep is to stick to a regular schedule. To keep your biologic clock in "sync," always get up at the same time, regardless of how much or little you feel you've slept.    2. GET A SLEEP RITUAL   Include at 1 hour “shut down time”- Perform techniques to relax before going to bed on a routine basis. These may include quite reading, gentle stretching or a warm bath   to reduce tension or listening to quiet music, among others. Whatever method you decide on, be sure to follow the ritual each night until it becomes a cue for your body to settle down.    3. EXERCISE REGULARLY-   Exercise may help to burn off tension, allowing you to "unwind" mentally and physically. The ideal time is late afternoon or early evening. Exercise should be stopped at least 90 minutes before your bedtime.     4. REDUCE STIMULANTS-   Many people get caffeine daily from coffee, tea, colas and chocolate. Have your last caffeine before noon if you have trouble with sleep. If you are taking any prescription or over the counter medications, ask your doctor whether they may affect your sleep.    5. SLEEP ON A GOOD BED-   If your bed is older than 8 years, consider getting a new one. A good neck or back support pillow may be helpful.    6. REDUCE OR STOP SMOKING-   The nicotine found in cigarettes is a stronger stimulant than caffeine. Smokers who break their habit may have dramatic improvements in sleep.    7. LIMIT ALCOHOL CONSUMPTION-   Even moderate drinking of alcoholic beverages can suppress REM and deep sleep and results in fragmented, unrefreshing sleep. Alcohol can worsen snoring and sleep apnea as well. Keep at least 3 hours between alcohol consumption and sleep.     8. GET QUALITY AND QUANTITY SLEEP-   7-9 hours of sleep time are recommend for optimal health. Naps of 10-30 minutes in the early afternoon may help restore daytime alertness  without making sleep onset much more difficult.    9. SET ASIDE A "WORRY TIME"-   Try to resolve problems early in the evening before going to bed. If distractions follow you to bed, tell yourself to deal with them during the next day's worry time.    10. DON'T GO TO BED STUFFED OR STARVED-   Avoid late night meals. Avoid snacks that cause gas such as peanuts, beans, fruit or raw vegetables. If you are dieting, don't go to bed hungry. Eat a low calorie snack, such as a banana or apple.    11. DON’T WATCH THE CLOCK.   Remove clocks from the bedroom if necessary as staring at the clock will perpetuate insomnia.     12. DON”T STAY IN THE BED IF YOU CAN”T SLEEP.   Recondition your brain to know that the bed is for sleeping by ending the practice of spending time in bed anxiously not sleeping. Do something relaxing and not stimulating such as listening to music or looking at a magazine until you become sleepy, then go back to bed.    13. AVOID ARTIFICIALLY LIT SCREENS (TV, computers, cell phones)   Do so for at least 1 hour before bedtime as this will worsen sleep quality and suppress natural melatonin production.

## 2022-02-11 ENCOUNTER — Other Ambulatory Visit: Payer: Self-pay | Admitting: Internal Medicine

## 2022-02-11 DIAGNOSIS — E782 Mixed hyperlipidemia: Secondary | ICD-10-CM

## 2022-02-11 DIAGNOSIS — Z0000 Encounter for general adult medical examination without abnormal findings: Secondary | ICD-10-CM

## 2022-02-15 ENCOUNTER — Other Ambulatory Visit: Payer: Self-pay | Admitting: Internal Medicine

## 2022-02-15 DIAGNOSIS — E782 Mixed hyperlipidemia: Secondary | ICD-10-CM

## 2022-02-15 DIAGNOSIS — Z0000 Encounter for general adult medical examination without abnormal findings: Secondary | ICD-10-CM

## 2022-02-15 NOTE — Progress Notes (Unsigned)
Blood Work:     Fasting? = {YES/NO:21936}  Location of stick:{AC Side:63427::"Left AC"} using aseptic technique.  Outcome: Patient tolerated well without any complications. Attempt #{Numbers:63462}    Collected:   {Numbers:63462} Gold/Tiger top tubes.  {Numbers:63462} Lavender EDTA top tubes.  {Numbers:63462} other tube.    Lab/Temperature: {Lab & Temp:63463}

## 2022-02-16 ENCOUNTER — Ambulatory Visit (INDEPENDENT_AMBULATORY_CARE_PROVIDER_SITE_OTHER): Payer: No Typology Code available for payment source

## 2022-02-16 ENCOUNTER — Other Ambulatory Visit: Payer: Self-pay | Admitting: Internal Medicine

## 2022-02-16 DIAGNOSIS — I10 Essential (primary) hypertension: Secondary | ICD-10-CM

## 2022-02-16 DIAGNOSIS — Z0000 Encounter for general adult medical examination without abnormal findings: Secondary | ICD-10-CM

## 2022-02-16 DIAGNOSIS — E559 Vitamin D deficiency, unspecified: Secondary | ICD-10-CM

## 2022-02-16 DIAGNOSIS — E7800 Pure hypercholesterolemia, unspecified: Secondary | ICD-10-CM

## 2022-02-16 DIAGNOSIS — G4733 Obstructive sleep apnea (adult) (pediatric): Secondary | ICD-10-CM

## 2022-02-16 DIAGNOSIS — Z6841 Body Mass Index (BMI) 40.0 and over, adult: Secondary | ICD-10-CM

## 2022-02-17 ENCOUNTER — Ambulatory Visit
Admission: RE | Admit: 2022-02-17 | Discharge: 2022-02-17 | Disposition: A | Discharge status: Home / Self Care | Payer: No Typology Code available for payment source | Source: Ambulatory Visit | Attending: Cardiovascular Disease | Admitting: Cardiovascular Disease

## 2022-02-17 ENCOUNTER — Ambulatory Visit (INDEPENDENT_AMBULATORY_CARE_PROVIDER_SITE_OTHER): Payer: No Typology Code available for payment source | Admitting: Cardiovascular Disease

## 2022-02-17 ENCOUNTER — Encounter (INDEPENDENT_AMBULATORY_CARE_PROVIDER_SITE_OTHER): Payer: Self-pay | Admitting: Cardiovascular Disease

## 2022-02-17 VITALS — BP 137/87 | HR 65 | Ht 71.0 in | Wt 211.0 lb

## 2022-02-17 DIAGNOSIS — Z9989 Dependence on other enabling machines and devices: Secondary | ICD-10-CM

## 2022-02-17 DIAGNOSIS — E7800 Pure hypercholesterolemia, unspecified: Secondary | ICD-10-CM

## 2022-02-17 DIAGNOSIS — Z8249 Family history of ischemic heart disease and other diseases of the circulatory system: Secondary | ICD-10-CM

## 2022-02-17 DIAGNOSIS — G4733 Obstructive sleep apnea (adult) (pediatric): Secondary | ICD-10-CM

## 2022-02-17 DIAGNOSIS — I10 Essential (primary) hypertension: Secondary | ICD-10-CM

## 2022-02-17 LAB — ECG 12-LEAD
Atrial Rate: 65 {beats}/min
IHS MUSE NARRATIVE AND IMPRESSION: NORMAL
P Axis: 17 degrees
P-R Interval: 192 ms
Q-T Interval: 384 ms
QRS Duration: 88 ms
QTC Calculation (Bezet): 399 ms
R Axis: 24 degrees
T Axis: 38 degrees
Ventricular Rate: 65 {beats}/min

## 2022-02-17 NOTE — Progress Notes (Addendum)
[place] [facility] CARDIOLOGY OFFICE PROGRESS NOTE    [facility] OFFICE -CARDIOLOGY  [address]  [place] [address]  Dept: [phone]  Dept Fax: [phone]       Patient Name: Guy Martinez    Date of Visit:  February 17, 2022  Date of Birth: [birth year]  AGE: 51 y.o.  Medical Record #: [id number]  Requesting Physician: Sarina Ill, MD      CHIEF COMPLAINT: Hyperlipidemia        HISTORY OF PRESENT ILLNESS:    He is a pleasant 51 y.o. male who presents today for a follow-up visit.     Doing well from medical standpoint. Intermittent exercise.  He has been undergoing golfing.  Unfortunately he has some stress at home due to wife's diagnosis of liver cancer.  He has an MRI coming up for possible torn rotator cuff.  He is tolerating CPAP for sleep apnea.  No angina or dyspnea.  Blood pressure is elevated at home 150s to 170s      PAST MEDICAL HISTORY: He has a past medical history of H/O Coronary CT Angiogram (10/2010), H/O echocardiogram (10/2014), H/O stress echocardiogram (02/2009), H/O treadmill stress test (01/2008, 05/2010, 11/2015, 12/12/15), Hyperlipidemia, Hypertension, and Sleep apnea. He has a past surgical history that includes Wisdom tooth extraction and Colonoscopy (N/A, 12/03/2021).    ALLERGIES: No Known Allergies    MEDICATIONS:     Current Outpatient Medications:     atorvastatin (LIPITOR) 20 MG tablet, TAKE 1 TABLET BY MOUTH EVERY DAY, Disp: 90 tablet, Rfl: 3    atorvastatin (LIPITOR) 20 MG tablet, TAKE 1 TABLET BY MOUTH EVERY DAY, Disp: 90 tablet, Rfl: 3    lisinopril (ZESTRIL) 40 MG tablet, Take 1 tablet (40 mg) by mouth daily (Patient taking differently: Take 1 tablet (40 mg) by mouth every morning), Disp: 90 tablet, Rfl: 3    polyethylene glycol w/ electrolytes (GOLYTELY) oral solution, Follow instructions given by gastroenterology, Disp: 4000 mL, Rfl: 0       FAMILY HISTORY: family history includes Appendicitis in his son; Asthma in his son;  COPD in his paternal grandfather; Cancer (age of onset: 10) in his father; Diabetes in his father, paternal grandfather, and paternal grandparent; Diverticulitis in his mother; Emphysema in his paternal grandparent; Heart attack in his paternal grandfather; Heart attack (age of onset: 72) in his father; Heart disease in his father and paternal grandparent; Hyperlipidemia in his father; Hypertension in his father; Myocardial Infarction in his father; No known problems in his brother, paternal grandmother, sister, son, and son; Pancreatic cancer (age of onset: 41) in his paternal uncle; Stroke in his father.    SOCIAL HISTORY: He reports that he quit smoking about 10 years ago. His smoking use included cigarettes. He has a 0.50 pack-year smoking history. He quit smokeless tobacco use about 9 years ago.  His smokeless tobacco use included snuff. He reports that he does not currently use alcohol. He reports that he does not use drugs.    PHYSICAL EXAMINATION    Visit Vitals  BP 137/87 (BP Site: Right arm, Patient Position: Sitting, Cuff Size: Medium)   Pulse 65   Ht 1.803 m (5\' 11" )   Wt 95.7 kg (211 lb)   BMI 29.43 kg/m       GENERAL: Patient is in no acute distress   HEENT: No scleral icterus or conjunctival pallor, moist mucous membranes   NECK: No jugular venous distention or  thyromegaly, normal carotid upstrokes without bruits   CARDIAC: Regular rate and rhythm, with normal S1 and S2, and no murmurs, rubs, or gallops   CHEST: Clear to auscultation bilaterally, normal respiratory effort  ABDOMEN: Soft, nontender, non-distended, normoactive bowel sounds   EXTREMITIES: No clubbing, cyanosis, or edema, 2+ DP  SKIN: No rash or jaundice   NEUROLOGIC: Alert and oriented to time, place and person, normal mood and affect   MUSCULOSKELETAL: Normal muscle strength and tone.          LABS:   Lab Results   Component Value Date    WBC 5.90 09/25/2021    HGB 14.2 09/25/2021    HCT 42.5 09/25/2021    PLT 217 09/25/2021     Lab  Results   Component Value Date    GLU 101 (H) 09/25/2021    BUN 14.0 09/25/2021    CREAT 1.0 09/25/2021    NA 138 09/25/2021    K 4.4 09/25/2021    CL 106 09/25/2021    CO2 25 09/25/2021    AST 27 09/25/2021    ALT 48 09/25/2021     Lab Results   Component Value Date    MG 2.2 08/10/2017    TSH 3.16 09/25/2021    HGBA1C 5.1 12/05/2020    BNP 15.9 08/10/2017     Lab Results   Component Value Date    CHOL 136 09/25/2021    TRIG 79 09/25/2021    HDL 32 (L) 09/25/2021    LDL 88 09/25/2021           Most recent echo and nuclear study reviewed.      IMPRESSION:   Guy Martinez is a 51 y.o. 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 CAC 126, 92nd percentile 12/2019.  Hypertension-elevated at home but better controlled in our office  Hyperlipidemia-on atorvastatin 20 mg daily  Family history of coronary disease-father had his first event in his 56s.  Normal ETT in 2021.  Overweight.  Sleep apnea on CPAP      RECOMMENDATIONS:    It is a pleasure to see Guy Martinez  We discussed the importance of exercise.  It is somewhat difficult for him given his busy work schedule and his wife's cancer diagnosis.  We will focus on more routine exercise.  He attributes him heart healthy dietary recommendations.  Return visit for nurse blood pressure check to see if his cuff is accurate.  If it is, and he is getting high blood pressures at home, he will likely need more antihypertensives.  Checking LP(a) and following up on his lipid profile to further guide statin therapy.  Carotid ultrasound to assess for risk of CVA.  He has some tingling in his feet.  He has 2+ posterior tibial and dorsalis pedis pulses.  Etiology for tingling is likely not PAD.  Recommend neurologic work-up.  Plan follow-up yearly or sooner as needed                                                   Orders Placed This Encounter   Procedures    US Carotid Duplex Dopp Comp Bilateral     Lipoprotein A (LPA)    ECG 12 lead (Normal)    Blood Pressure Check ([facility] [place])  Office Visit ([facility] [place])       No orders of the defined types were placed in this encounter.        SIGNED:    Belinda Fisher, MD          This note was generated by the Dragon speech recognition and may contain errors or omissions not intended by the user. Grammatical errors, random word insertions, deletions, pronoun errors, and incomplete sentences are occasional consequences of this technology due to software limitations. Not all errors are caught or corrected. If there are questions or concerns about the content of this note or information contained within the body of this dictation, they should be addressed directly with the author for clarification.

## 2022-02-17 NOTE — Patient Instructions (Signed)
Please visit [email or web address] for more information from the American College of Cardiology    EXERCISE RECOMMENDATIONS  For prevention of heart disease        Muscle strengthening exercises involving all major muscle groups should also be performed 2 days per week a moderate or greater intensity.  For patients 65 years and older, exercises emphasizing functional balance and strength training at moderate intensity should be performed 3 days per week.  Adherence to the recommendations above have been shown to reduce cardiovascular disease by up to 40% and reduce death by up to 31%.    What about going above and beyond?  There is evidence that doing MORE exercise than the above recommendations will increase the benefits of exercise.  There is also evidence that including vigorous exercise rather than only moderate exercise (for example, High Intensity Interval Training) will lead to additional benefits in cardiovascular function    For those just getting started...  Patients who change from daily inactivity (no exercise) to 30 minutes of walking per day get the maximum amount of relative benefit.    The above was adapted from information included in the following publication:  Tucker W, Fegers-Wustrow I, Halle M, et al. Exercise for Primary and Secondary Prevention of Cardiovascular Disease. J Am Coll Cardiol. 2022 Sep, 80 (11) 1091-1106.  https://doi.org/10.1016/j.jacc.2022.07.004      =============================  =============================    Please visit [email or web address] for more information from the American College of Cardiology    Heart Healthy Diet  Recommendations and Resources    ====================================    Mediterranean Diet    This diet can be simple to follow. See below for modified adherence screener  One point for each category below. Goal is 12-13 points.    Use olive oil as the principal source of fat for cooking/eating  Consume 4 or more tablespoons of olive oil daily. Including  frying, salads, meats, etc.  Consume 3 or more servings of vegetables daily  Consume 4 or more servings of fruit daily  On average, consume less than 1 serving of red meat, hamburger, sausages, ham, etc daily  On average, consume less than 1 serving of butter, margarine, cream daily (1 serving = 12 g)  On average, consume less than 1 serving of sugar-sweetened beverage daily  Consume 3 or more servings of legumes per week (beans, peas, lentils, etc)  Consume 3 or more servings of fish or shellfish per week  Consume 3 or more servings of nuts per week (1 serving = 30 g)  Consume no more than 2 commercial pastries (cookies, cakes, biscuits etc) per week  Consume more turkey and chicken instead of veal, pork, hamburger, sausage  Consume sofrito with vegetables at least twice weekly. Sofrito is a sauce made with tomato, onion, garlic simmered with olive oil.    Additional suggestions    Opt for whole grains. Switch to whole-grain bread, cereal and pasta. Experiment with other whole grains, such as bulgur and farro.  Enjoy some dairy. Eat low-fat Greek or plain yogurt and small amounts of a variety of cheeses.    ====================================    Copied from Mayoclinic.org with edits  https://www.mayoclinic.org/healthy-lifestyle/nutrition-and-healthy-eating/in-depth/mediterranean-diet/art-20047801    Why the Mediterranean diet?  Interest in the Mediterranean diet began in the 1960s with the observation that coronary heart disease caused fewer deaths in Mediterranean countries, such as [place] and [place], than in the [place] and [place]. Subsequent studies found that the Mediterranean diet is associated with reduced risk factors for cardiovascular disease.      The Mediterranean diet is one of the healthy eating plans recommended by the Dietary Guidelines for Americans to promote health and prevent chronic disease. It is also recognized by the World Health Organization as a healthy and sustainable dietary pattern  and as an intangible cultural asset by the United National Educational, Scientific and Cultural Organization.    What is the Mediterranean diet?  The Mediterranean diet is a way of eating based on the traditional cuisine of countries bordering the [place] Sea. While there is no single definition of the Mediterranean diet, it is typically high in vegetables, fruits, whole grains, beans, nut and seeds, and olive oil.    The main components of Mediterranean diet include:    Daily consumption of vegetables, fruits, whole grains and healthy fats  Weekly intake of fish, poultry, beans and eggs  Moderate portions of dairy products - favoring low fat or 0% yogurts  Limited intake of red meat    Plant based, not meat based  The foundation of the Mediterranean diet is vegetables, fruits, herbs, nuts, beans and whole grains. Meals are built around these plant-based foods. Moderate amounts of dairy, poultry and eggs are also central to the Mediterranean Diet, as is seafood. In contrast, red meat is eaten only occasionally.    Healthy fats  Healthy fats are a mainstay of the Mediterranean diet. They're eaten instead of less healthy fats, such as saturated and trans fats, which contribute to heart disease.    Olive oil is the primary source of added fat in the Mediterranean diet. Olive oil provides monounsaturated fat, which has been found to lower total cholesterol and low-density lipoprotein (LDL or "bad") cholesterol levels. Nuts and seeds also contain monounsaturated fat.    Fish are also important in the Mediterranean diet. Fatty fish -- such as mackerel, herring, sardines, albacore tuna, salmon and lake trout -- are rich in omega-3 fatty acids, a type of polyunsaturated fat that may reduce inflammation in the body. Omega-3 fatty acids also help decrease triglycerides, reduce blood clotting, and decrease the risk of stroke and heart failure.    ====================================    An Important Note on Trans  Fats  https://www.heart.org/en/healthy-living/healthy-eating/eat-smart/fats/trans-fat    We know research shows that reducing trans fat in the American diet helps reduce risk of heart disease. There are two broad types of trans fats found in foods: naturally-occurring and artificial trans fats. Naturally-occurring trans fats are produced in the gut of some animals and foods made from these animals (e.g., milk and meat products) may contain small quantities of these fats. Artificial trans fats (or trans fatty acids) are created in an industrial process that adds hydrogen to liquid vegetable oils to make them more solid. The primary dietary source for trans fats in processed food is "partially hydrogenated oils." Look for them on the ingredient list on food packages. In November 2013, the U.S. Food and Drug Administration (FDA) made a preliminary determination that partially hydrogenated oils are no longer Generally Recognized as Safe (GRAS) in human food.    Trans fats can be found in many foods - including fried foods like doughnuts, and baked goods including cakes, pie crusts, biscuits, frozen pizza, cookies, crackers, and stick margarines and other spreads. You can determine the amount of trans fats in a particular packaged food by looking at the Nutrition Facts panel. However, products can be listed as "0 grams of trans fats" if they contain 0 grams to less than 0.5 grams of trans fat per serving. You can   also spot trans fats by reading ingredient lists and looking for the ingredients referred to as "partially hydrogenated oils."    The American Heart Association recommends that adults who would benefit from lowering LDL cholesterol reduce their intake of trans fat and limit their consumption of saturated fat to 5 to 6% of total calories.    Here are some ways to achieve that:    Eat a dietary pattern that emphasizes fruits, vegetables, whole grains, low-fat dairy products, poultry, fish and nuts. Also limit red  meat and sugary foods and beverages.  Use naturally occurring, unhydrogenated vegetable oils such as canola, safflower, sunflower or olive oil most often.  Look for processed foods made with unhydrogenated oil rather than partially hydrogenated or hydrogenated vegetable oils or saturated fat.  Use soft margarine or naturally occurring oil as a substitute for butter, and choose soft margarines (liquid or tub varieties) over harder stick forms. Look for "0 g trans fat" on the Nutrition Facts label and no hydrogenated oils in the ingredients list.  Doughnuts, cookies, crackers, muffins, pies and cakes are examples of foods that may contain trans fat. Limit how frequently you eat them.  Limit commercially fried foods and baked goods made with shortening or partially hydrogenated vegetable oils. Not only are these foods very high in fat, but that fat is also likely to be trans fat.    For more information on American Heart Association's healthy lifestyle recommendations, please check their website:  https://www.heart.org/en/healthy-living/healthy-eating/eat-smart/nutrition-basics/aha-diet-and-lifestyle-recommendations    ====================================    If you would like individualized recommendations, I recommend:    Kelly Ellington RDN, CSOWM  Registered Dietician   Her practice is currently virtual     Contact info:  [email or web address]  Phone: [phone]  Fax: [phone]

## 2022-02-18 ENCOUNTER — Other Ambulatory Visit (INDEPENDENT_AMBULATORY_CARE_PROVIDER_SITE_OTHER): Payer: Self-pay | Admitting: Sports Medicine

## 2022-02-18 ENCOUNTER — Other Ambulatory Visit (INDEPENDENT_AMBULATORY_CARE_PROVIDER_SITE_OTHER): Payer: Self-pay | Admitting: Cardiovascular Disease

## 2022-02-18 ENCOUNTER — Ambulatory Visit (INDEPENDENT_AMBULATORY_CARE_PROVIDER_SITE_OTHER): Payer: No Typology Code available for payment source

## 2022-02-18 ENCOUNTER — Encounter (INDEPENDENT_AMBULATORY_CARE_PROVIDER_SITE_OTHER): Payer: Self-pay

## 2022-02-18 ENCOUNTER — Telehealth (INDEPENDENT_AMBULATORY_CARE_PROVIDER_SITE_OTHER): Payer: Self-pay

## 2022-02-18 DIAGNOSIS — I10 Essential (primary) hypertension: Secondary | ICD-10-CM

## 2022-02-18 MED ORDER — LOSARTAN POTASSIUM-HCTZ 100-25 MG PO TABS
1.0000 | ORAL_TABLET | Freq: Every day | ORAL | 3 refills | Status: DC
Start: 2022-02-18 — End: 2022-12-16

## 2022-02-18 NOTE — Progress Notes (Signed)
Pt presented asymptomatic. Took BP readings manually and entered into system. Pt's cuff then was used and readings were as entered below:  Left arm: 171/108  Right arm: 182/103  Right arm: 174/102     Presented to Dr. Eben Burow who cleared pt to go. Will forward to Dr. Dimple Casey for any further recommendations. Advised pt to get a different as well as bigger cuff as it is not calibrated correctly and the cuff size can play a part in giving an inaccurate reading.

## 2022-02-18 NOTE — Telephone Encounter (Signed)
Called pt in regards to Dr. Lynder Parents recommendations from his BP check:    "Looks like patient's home machine is not accurate. He will probably need to get a new one.   His BP is only mildly elevated - 130 to 140s. That's not perfect. So I think we should make a small switch. Stop Lisinopril. Start Losartan-HCTZ. Rx sent to pharmacy. BMP in 10-14 days."    Pt agreeable to recommendations. Will send order for lab to [facility] as pt requested

## 2022-02-19 ENCOUNTER — Telehealth: Payer: Self-pay | Admitting: Internal Medicine

## 2022-02-19 ENCOUNTER — Encounter (INDEPENDENT_AMBULATORY_CARE_PROVIDER_SITE_OTHER): Payer: Self-pay | Admitting: Sports Medicine

## 2022-02-19 NOTE — Telephone Encounter (Signed)
Guy Martinez called and said he hasn't seen his lab results yet and normally sees them within a day.   He just checking in to make sure they are still in process?     Thank you

## 2022-02-19 NOTE — Telephone Encounter (Signed)
Called [facility] to verify that the labs sent in resulted. Was told by the [facility] rep that she would send a fax with the results. Will update when fax is received

## 2022-02-19 NOTE — Progress Notes (Signed)
Recent test results were reviewed. The following message was sent to the patient through MyChart:      Mr. Pressnell:    The results from your recent test(s) returned for your review in MyChart.  Good news. LP(a) is normal.  I recommend working on heart healthy diet and exercise to get your LDL closer to 70. No need to change more medications at this point.    Thank you,  Belinda Fisher, MD, [facility], FSCAI  [facility], Interventional Cardiology

## 2022-02-22 ENCOUNTER — Ambulatory Visit (INDEPENDENT_AMBULATORY_CARE_PROVIDER_SITE_OTHER): Payer: No Typology Code available for payment source | Admitting: Sports Medicine

## 2022-02-22 ENCOUNTER — Telehealth: Payer: Self-pay | Admitting: Sports Medicine

## 2022-02-22 ENCOUNTER — Telehealth: Payer: Self-pay

## 2022-02-22 ENCOUNTER — Encounter: Payer: Self-pay | Admitting: Internal Medicine

## 2022-02-22 VITALS — BP 153/92 | HR 73

## 2022-02-22 DIAGNOSIS — M75122 Complete rotator cuff tear or rupture of left shoulder, not specified as traumatic: Secondary | ICD-10-CM

## 2022-02-22 NOTE — Telephone Encounter (Signed)
Left a voicemail to scheduled MRI f/u with Dr. Arsenio Loader.

## 2022-02-22 NOTE — Progress Notes (Signed)
[facility]    Provider: Valera Castle, MD  Date of Exam:  02/22/2022   Patient:  Guy Martinez  DOB:  Jul 22, 1970    AGE:  51 y.o.  MR#:  [id number]     Chief Complaint: Left shoulder pain.    HPI:  Guy Martinez is a pleasant 51 y.o.-year-old right  hand dominant male who presents today with a history of left shoulder pain for MRI Follow up. Guy Martinez has been having pain for the last 2 months after his kids jumped on top of him. he rates the pain at a 4/10 on today's visit.  His pain is localized to the anterior shoulder. His pain is made worse with internal rotation, reaching.  Guy Martinez's symptoms are improved with rest. He has tried Ibuprofen/Tylenol with little improvement.  He reports moderate changes in strength. He has attempted modification of activity with little improvement. Guy Martinez is now here for further evaluation and discussion of treatment options following his MRI.    For other past medical history, social history, family history and past surgical history please see them listed below.    Club/School/Work Affiliation: none    Problem List:   Patient Active Problem List   Diagnosis    HLD (hyperlipidemia)    Morbid obesity    HTN (hypertension)    Actinic keratosis    Sebaceous cyst    Body mass index 45.0-49.9, adult    Alopecia    Disorder of hair follicle    Changes in skin texture    Melanocytic nevi of trunk    Melanocytic nevi, unspecified    Other disorders of skin and subcutaneous tissue in diseases classified elsewhere    Tinea pedis    Seborrheic keratosis    Vitamin D deficiency    OSA (obstructive sleep apnea)    Lung nodule    Encounter for screening and preventative care        Past Medical History:    Past Medical History:   Diagnosis Date    H/O Coronary CT Angiogram 10/2010    H/O echocardiogram 10/2014    H/O stress echocardiogram 02/2009    normal    H/O treadmill stress test 01/2008, 05/2010, 11/2015, 12/12/15    Hyperlipidemia     MANAGED WITH MEDS.    Hypertension     controlled w/med  per pt    Sleep apnea     +OSA - USES CPAP NIGHTLY.       Social History:   Social History     Tobacco Use    Smoking status: Former     Packs/day: 0.25     Years: 2.00     Additional pack years: 0.00     Total pack years: 0.50     Types: Cigarettes     Quit date: 03/05/2011     Years since quitting: 10.9    Smokeless tobacco: Former     Types: Snuff     Quit date: 05/31/2012    Tobacco comments:     Social use. 5-7 cig/week   Vaping Use    Vaping Use: Never used   Substance Use Topics    Alcohol use: Not Currently     Comment: < weekly basis    Drug use: Never       Family History:   Family History   Problem Relation Age of Onset    Diverticulitis Mother     Cancer Father 74        Prostate,  Bladder    Heart attack Father 52        4 MI's    Stroke Father 79        2 strokes    Hypertension Father     Diabetes Father     Hyperlipidemia Father     Heart disease Father     Myocardial Infarction Father     No known problems Sister     No known problems Brother     No known problems Son     No known problems Paternal Grandmother     COPD Paternal Grandfather     Heart attack Paternal Grandfather     Diabetes Paternal Grandfather     Appendicitis Son     Asthma Son     No known problems Son     Pancreatic cancer Paternal Uncle 26    Heart disease Paternal Grandparent     Diabetes Paternal Grandparent     Emphysema Paternal Grandparent         pulmonary       Past Surgical History:    Past Surgical History:   Procedure Laterality Date    COLONOSCOPY N/A 12/03/2021    Repeat 5 years. Tubular Adenoma;  Surgeon: Denny Levy, MD;  Location: [facility];  Service: Gastroenterology;    WISDOM TOOTH EXTRACTION         Medications:  Scheduled Meds:  Continuous Infusions:  PRN Meds:.    Current Outpatient Medications:     atorvastatin (LIPITOR) 20 MG tablet, TAKE 1 TABLET BY MOUTH EVERY DAY, Disp: 90 tablet, Rfl: 3    losartan-hydrochlorothiazide (HYZAAR) 100-25 MG per tablet, Take 1 tablet by mouth daily, Disp: 90 tablet, Rfl:  3     Allergies:  No Known Allergies    ROS:  Constitutional: No fatigue, fever, weight loss, or weight gain.   Ears, Nose, Mouth & Throat: No sore throat or hearing loss.   Cardiovascular: No chest pain, blood clots, or leg cramps.   Respiratory: No shortness of breath, cough, or difficulty breathing.   Gastrointestinal: No nausea, vomiting, diarrhea, or loss of appetite.   Genitourinary: No polyuria or kidney disease.   Musculoskeletal: No joint aches, muscle weakness or swelling of joints/body parts other than that mentioned above.  Integumentary: No finger nail changes or skin dryness.   Neurological: No numbness, burning discomfort, or headaches.   Psychiatric: No depression or anxiety.   Endocrine: No increased thirst, change in appetite or thyroid disease.   Hematologic/Lymphatic: No easy bruising or anemia.     Exam:   Left Shoulder:  Skin Intact  Erythema None present  Swelling None present  Atrophy No identifiable  TTP at ACJ None  TTP at Biceps groove None  TTP Lateral/GT None  Other Areas of TTP None    Active ROM:   Forward Flexion 110  Abduction 130    Passive ROM:  Forward Flexion 170  ER at side 70  IR at Side T10  Scapular Dyskinesis Negative    Strength:  Supraspinatus 4/5  Infraspinatus 5/5  Subscapularis 4/5    Labral/Biceps:  Obrien's Test: Negative  Speed's Test: Negative  Dynamic Labral Shear Test: Negative  Crank Test: Negative  Anterior Apprehension Test: Negative  Anterior Relocation Test: Negative  Posterior Apprehension Test: Negative  Jerk Test: Negative    Other:  Neer Sign: Positive  Hawkin's Sign: Positive    Radial Pulse 2+  DTR and Pathological Reflexes Intact  No lymphadenopathy  Radial/Median/Ulnar Nerves intact to motor and sensory provocation  Hand Perfused with CR <2 sec    Right Shoulder:  Skin Intact  Erythema None present  Swelling None present  Atrophy No identifiable  TTP at ACJ None  TTP at Biceps groove None  TTP Lateral/GT None  Other Areas of TTP None    Active ROM:    Forward Flexion 170  Abduction 170    Passive ROM:  Forward Flexion 170  ER at side 70  IR at Side T10  Scapular Dyskinesis Negative    Strength:  Supraspinatus 5/5  Infraspinatus 5/5  Subscapularis 5/5    Vitals: There were no vitals taken for this visit.    General: Culley was awake, alert, oriented, easily engaged, displayed logical thinking with clear speech and was neat in appearance. His general appearance was normal, well-developed and well-nourished.    Gait: The patient demonstrated a non-antalgic gait with intact coordination and balance.     Studies: MRI independently reviewed:  IMPRESSION:      1.  Full-thickness retracted tears of the supraspinatus and  subscapularis tendons. Moderate infraspinatus tendinosis with articular  surface partial tearing and delamination along distal tendon.      2.  Medial dislocation of the long head biceps tendon from the  intertubercular groove. Marked tendinosis and partial tearing along the  intra-articular long head biceps tendon.     3.  Mild glenohumeral degenerative changes with cartilage loss and  labral tears. Minimal osteophytes.     4.  Moderate glenohumeral effusion with synovitis communicating with the  subacromial subdeltoid bursa. There is a 1 cm loose body or synovial debris  in the biceps tendon sheath.     5.  Mild left acromioclavicular joint degenerative changes and a  moderately large subacromial spur.    Assessment/Plan: Jacquelyn is a pleasant 50 y.o. 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   the patient's diagnosis and treatment options were discussed in detail at today's visit. I have recommended a RTC repair, SAD, extensive debridement, OBT.  He understands risk and benefits of surgery risk and benefits were explained he would like to proceed.  All questions were answered to the patient's satisfaction.  The patient will  notify my clinic of any changes or worsening of their symptoms during the interim.      Elements of MDM:   Based on the below 2023 Evaluation and Management Time Requirements, the appropriate level of service is [phone] based on the 42 minutes spent face-to-face with the patient along with the non-face-to-face time spent by me performing the following highlighted activities:      Preparing for the visit (such as reviewing tests)  Getting and/or reviewing a history that was separately obtained  Performing the exam  Counseling and providing education to the patient, family, or caregiver  Documenting information in the medical record  Interpreting results and sharing that information with the patient, family or caregiver  Reviewing the risks of surgery   Surgical scheduling, coding and posting  Planning post-operative care (arranging follow up visits, PT prescriptions, ice machine, bracing, reviewing protocols, work notes)  Ordering medicines, tests, or procedures  Communicating with other healthcare professionals  Care coordination           I, Valera Castle, MD, have personally performed the history, physical exam and medical decision making; and confirmed the accuracy of the information  in the transcribed note.    Date: 02/22/2022 Time: 4:54 PM

## 2022-02-22 NOTE — Telephone Encounter (Signed)
Patient called and updated about lab status. Patient was made aware that we have received his lab results from [facility]. Results are scanned into chart.

## 2022-02-23 ENCOUNTER — Encounter: Payer: Self-pay | Admitting: Internal Medicine

## 2022-02-23 ENCOUNTER — Ambulatory Visit (INDEPENDENT_AMBULATORY_CARE_PROVIDER_SITE_OTHER): Payer: No Typology Code available for payment source | Admitting: Internal Medicine

## 2022-02-23 ENCOUNTER — Encounter (INDEPENDENT_AMBULATORY_CARE_PROVIDER_SITE_OTHER): Payer: Self-pay | Admitting: Sports Medicine

## 2022-02-23 VITALS — BP 151/90 | HR 70 | Temp 97.9°F

## 2022-02-23 DIAGNOSIS — G4733 Obstructive sleep apnea (adult) (pediatric): Secondary | ICD-10-CM

## 2022-02-23 DIAGNOSIS — E663 Overweight: Secondary | ICD-10-CM

## 2022-02-23 DIAGNOSIS — E7800 Pure hypercholesterolemia, unspecified: Secondary | ICD-10-CM

## 2022-02-23 DIAGNOSIS — R911 Solitary pulmonary nodule: Secondary | ICD-10-CM

## 2022-02-23 DIAGNOSIS — I10 Essential (primary) hypertension: Secondary | ICD-10-CM

## 2022-02-23 DIAGNOSIS — E559 Vitamin D deficiency, unspecified: Secondary | ICD-10-CM

## 2022-02-23 DIAGNOSIS — N5089 Other specified disorders of the male genital organs: Secondary | ICD-10-CM

## 2022-02-23 DIAGNOSIS — Z0000 Encounter for general adult medical examination without abnormal findings: Secondary | ICD-10-CM

## 2022-02-23 NOTE — Progress Notes (Signed)
[  x]Hearing Test: Hearing protection was discussed.  The use of ear plugs or noise cancelling headphones were encouraged when exposed to loud noise or when traveling on an airplane.     [x] Vision Test: Results reviewed with patient in detail    [] Spirometry Test:  Not available at this time.    [x] InBody: Test performed and results discussed with physician.    [x] EKG: Test performed and results discussed with physician.              Updated patient's chart:  Allergies  Medications  History  Depression Screening  Health Maintenance  Immunizations  Vital signs  Pharmacy  Advance Directive    Patient seen by Dr. Kim for physical exam

## 2022-02-23 NOTE — Progress Notes (Signed)
Guy Martinez is a 51 y.o. male presents today for repeat physical    Chief Complaint   Patient presents with    Annual Exam     Cards/Sleep: [facility]  HTN - Recently recommended to change from lisinopril to losartan-hctz which pt has not done so yet.  HLD - Statin 20 mg  Family history of early cardiac disease - Evaluated at [facility] for non-obstructive disease.   OSA - on CPAP    FEN:  Low Vitamin D -has not been regularly taking his vitamin D supplementation    Ortho: Dr. Arsenio Loader  Left Shoulder - recent visit with ortho indicates he will need surgery for a repair.     Derm:  Numerous moles -following closely with Dr. Cyndie Chime of [facility]    Health Maintenance  Colonoscopy: 2023 with Dr. Malva Limes. Repeat due 2028.  Testicular Exams: Done regularly  Derm: Following with Dr. Cyndie Chime ([facility])  Dental: Annually     Surgical History: Reviewed and noted below  Family History: Reviewed and noted below. Father with history of early heart attack.  Social Hisory: Reviewed and noted below     Family: Lives with wife and four kids. 19, 17, 16, 29 yo. Rottie mix and another rescue.  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  Stress: High. Good oulets and support network among colleagues and friends.  Hobbies: Golf in the 53s. Kids.  Exercise: No formal exercise. Recently got the hydrorow and mirror.  Diet: Could use work. Eating out more.  Sleep: 7 hrs/night. No difficulty falling or staying asleep. Using cpap. Well rested in the AM and no daytime somnolence.          HISTORY     Patient Active Problem List   Diagnosis    HLD (hyperlipidemia)    Morbid obesity    HTN (hypertension)    Actinic keratosis    Sebaceous cyst    Overweight (BMI 25.0-29.9)    Alopecia    Disorder of hair follicle    Melanocytic nevi of trunk    Tinea pedis    Seborrheic keratosis    Vitamin D deficiency    OSA (obstructive sleep apnea)    Lung nodule    Encounter for screening and preventative care       Past  Medical History:   Diagnosis Date    H/O Coronary CT Angiogram 10/2010    H/O echocardiogram 10/2014    H/O stress echocardiogram 02/2009    normal    H/O treadmill stress test 01/2008, 05/2010, 11/2015, 12/12/15    Hyperlipidemia     MANAGED WITH MEDS.    Hypertension     controlled w/med per pt    Sleep apnea     +OSA - USES CPAP NIGHTLY.       Current Outpatient Medications on File Prior to Visit   Medication Sig Dispense Refill    atorvastatin (LIPITOR) 20 MG tablet TAKE 1 TABLET BY MOUTH EVERY DAY 90 tablet 3    [DISCONTINUED] lisinopril (ZESTRIL) 40 MG tablet Take 1 tablet (40 mg) by mouth daily      losartan-hydrochlorothiazide (HYZAAR) 100-25 MG per tablet Take 1 tablet by mouth daily (Patient not taking: Reported on 02/23/2022) 90 tablet 3     No current facility-administered medications on file prior to visit.       No Known Allergies    Past Surgical History:   Procedure Laterality Date    COLONOSCOPY N/A  12/03/2021    Repeat 5 years. Tubular Adenoma;  Surgeon: Denny Levy, MD;  Location: [facility];  Service: Gastroenterology;    WISDOM TOOTH EXTRACTION         Family History   Problem Relation Age of Onset    Diverticulitis Mother     Cancer Father 60        Prostate, Bladder    Heart attack Father 79        4 MI's    Stroke Father 62        2 strokes    Hypertension Father     Diabetes Father     Hyperlipidemia Father     Heart disease Father     Myocardial Infarction Father     No known problems Sister     No known problems Brother     No known problems Paternal Grandmother     COPD Paternal Grandfather     Heart attack Paternal Grandfather     Diabetes Paternal Grandfather     Heart disease Paternal Grandparent     Diabetes Paternal Grandparent     Emphysema Paternal Grandparent         pulmonary    No known problems Son     Appendicitis Son     Asthma Son     No known problems Son     Pancreatic cancer Paternal Uncle 74       Social History     Tobacco Use    Smoking status: Former     Packs/day:  0.25     Years: 2.00     Additional pack years: 0.00     Total pack years: 0.50     Types: Cigarettes     Quit date: 03/05/2011     Years since quitting: 10.9    Smokeless tobacco: Former     Types: Snuff     Quit date: 05/31/2012    Tobacco comments:     Social use. 5-7 cig/week   Vaping Use    Vaping Use: Never used   Substance Use Topics    Alcohol use: Not Currently     Alcohol/week: 7.0 standard drinks of alcohol     Types: 7 Drinks containing 0.5 oz of alcohol per week     Comment: < weekly basis    Drug use: Never         REVIEW OF SYSTEMS     Review of Systems   Constitutional:  Negative for chills, fever, malaise/fatigue and weight loss.   HENT:  Negative for congestion, ear discharge, ear pain, hearing loss, sinus pain and sore throat.    Eyes:  Negative for blurred vision, double vision, pain and discharge.   Respiratory:  Negative for cough, shortness of breath and wheezing.    Cardiovascular:  Negative for chest pain, palpitations, leg swelling and PND.   Gastrointestinal:  Negative for abdominal pain, blood in stool, constipation, diarrhea, nausea and vomiting.   Genitourinary:  Negative for dysuria, frequency and hematuria.   Musculoskeletal:  Negative for back pain, joint pain, myalgias and neck pain.   Skin:  Negative for rash.   Neurological:  Negative for dizziness, focal weakness, loss of consciousness, weakness and headaches.   Psychiatric/Behavioral:  The patient does not have insomnia.          PHYSICAL EXAM     Vital Signs (most recent): BP 151/90 (BP Site: Left arm, Patient Position: Sitting, Cuff Size: Large)   Pulse  70   Temp 97.9 F (36.6 C) (Oral)   SpO2 98%     Physical Exam  Vitals reviewed.   Constitutional:       General: He is not in acute distress.     Appearance: Normal appearance. He is obese. He is not ill-appearing, toxic-appearing or diaphoretic.   HENT:      Head: Normocephalic and atraumatic.      Right Ear: Tympanic membrane, ear canal and external ear normal. There is no  impacted cerumen.      Left Ear: Tympanic membrane, ear canal and external ear normal. There is no impacted cerumen.      Nose: Nose normal. No congestion or rhinorrhea.      Mouth/Throat:      Mouth: Mucous membranes are moist.      Pharynx: Oropharynx is clear. No oropharyngeal exudate or posterior oropharyngeal erythema.   Eyes:      General: No scleral icterus.        Right eye: No discharge.         Left eye: No discharge.      Extraocular Movements: Extraocular movements intact.      Conjunctiva/sclera: Conjunctivae normal.      Pupils: Pupils are equal, round, and reactive to light.   Neck:      Vascular: No carotid bruit.   Cardiovascular:      Rate and Rhythm: Normal rate and regular rhythm.      Pulses: Normal pulses.      Heart sounds: Normal heart sounds. No murmur heard.     No friction rub. No gallop.   Pulmonary:      Effort: Pulmonary effort is normal. No respiratory distress.      Breath sounds: Normal breath sounds. No stridor. No wheezing, rhonchi or rales.   Chest:      Chest wall: No tenderness.   Abdominal:      General: Abdomen is flat. Bowel sounds are normal. There is no distension.      Palpations: Abdomen is soft. There is no mass.      Tenderness: There is no abdominal tenderness. There is no right CVA tenderness, left CVA tenderness, guarding or rebound.      Hernia: No hernia is present.   Genitourinary:     Penis: Normal.       Testes: Normal.      Comments: Fullness over left testicle. Non-tender.  Musculoskeletal:         General: No swelling, tenderness, deformity or signs of injury. Normal range of motion.      Cervical back: Normal range of motion and neck supple. No rigidity or tenderness.      Right lower leg: No edema.      Left lower leg: No edema.   Lymphadenopathy:      Cervical: No cervical adenopathy.   Skin:     General: Skin is warm and dry.      Capillary Refill: Capillary refill takes less than 2 seconds.      Coloration: Skin is not jaundiced or pale.      Findings:  No bruising, erythema, lesion or rash.   Neurological:      General: No focal deficit present.      Mental Status: He is alert and oriented to person, place, and time. Mental status is at baseline.      Cranial Nerves: No cranial nerve deficit.      Sensory: No sensory deficit.  Motor: No weakness.      Coordination: Coordination normal.      Gait: Gait normal.      Deep Tendon Reflexes: Reflexes normal.   Psychiatric:         Mood and Affect: Mood normal.         Behavior: Behavior normal.         Thought Content: Thought content normal.         Judgment: Judgment normal.          ASSESSMENT & PLAN     1. Lung nodule  CT INTERVAL LUNG LOW DOSE      2. Primary hypertension        3. Mass of left testicle  Scrotal Ultrasound      4. Pure hypercholesterolemia        5. OSA (obstructive sleep apnea)        6. Morbid obesity        7. Overweight (BMI 25.0-29.9)        8. Vitamin D deficiency            Cards/Sleep: [facility]  HTN - Advised pt to proceed change from lisinopril to losartan-hctz.  HLD - Statin 20 mg  Family history of early cardiac disease - Evaluated at [facility] for non-obstructive disease.   OSA - on CPAP    FEN:  Low Vitamin D - recommended resuming regular supplementation    Ortho: Dr. Arsenio Loader  Left Shoulder - Pt will be scheduling a repair for later this year.     Derm:  Numerous moles -following closely with Dr. Cyndie Chime of [facility]    GU:  Scrotal Mass - [place] order provided    Pulm:  4mm Lung Nodule with history of tobacco use - repeat imaging ordered.    Health Maintenance  - COVID due fall 2023  - Flu shot due fall 2023  - Shingles overdue  - Pneumonia Vaccine: at 51 yo  - Tdap: 2021  - Hep C: negative  - Colonoscopy Due 2028 with Dr. Malva Limes  - Dexa at 51yo  - PSA normal    The 10-year ASCVD risk score (Arnett DK, et al., 2019) is: 5.8%    Values used to calculate the score:      Age: 64 years      Sex: Male      Is Non-Hispanic African American: No      Diabetic: No      Tobacco  smoker: No      Systolic Blood Pressure: 151 mmHg      Is BP treated: Yes      HDL Cholesterol: 43 mg/dL      Total Cholesterol: 161 mg/dL    EKG 07/14/863:  Normal Sinus Rhythm. Normal axis. Normal intervals. No ST elevations, ST depression, q waves, or inverted T waves appreciated.  Vision: decreased near acuity. Normal far vision.  Hearing: slight decrease in the higher frequencies    Advance directive: on file    Office follow up in 12 months for repeat physical or sooner as needed.    Sarina Ill, MD 03/01/2022 4:07 PM

## 2022-02-24 ENCOUNTER — Encounter (INDEPENDENT_AMBULATORY_CARE_PROVIDER_SITE_OTHER): Payer: Self-pay

## 2022-02-24 ENCOUNTER — Telehealth (INDEPENDENT_AMBULATORY_CARE_PROVIDER_SITE_OTHER): Payer: Self-pay

## 2022-02-24 NOTE — Telephone Encounter (Signed)
Tried returning patient's phone call to schedule surgery. Left a voicemail

## 2022-02-25 ENCOUNTER — Encounter (INDEPENDENT_AMBULATORY_CARE_PROVIDER_SITE_OTHER): Payer: Self-pay

## 2022-02-25 ENCOUNTER — Encounter (INDEPENDENT_AMBULATORY_CARE_PROVIDER_SITE_OTHER): Payer: Self-pay | Admitting: Sports Medicine

## 2022-03-01 ENCOUNTER — Encounter: Payer: Self-pay | Admitting: Internal Medicine

## 2022-03-09 ENCOUNTER — Other Ambulatory Visit: Payer: No Typology Code available for payment source

## 2022-03-11 ENCOUNTER — Ambulatory Visit (INDEPENDENT_AMBULATORY_CARE_PROVIDER_SITE_OTHER): Payer: No Typology Code available for payment source

## 2022-03-11 DIAGNOSIS — Z23 Encounter for immunization: Secondary | ICD-10-CM

## 2022-03-11 DIAGNOSIS — I10 Essential (primary) hypertension: Secondary | ICD-10-CM

## 2022-03-11 LAB — COMPREHENSIVE METABOLIC PANEL
ALT: 71 U/L — ABNORMAL HIGH (ref 0–55)
AST (SGOT): 44 U/L — ABNORMAL HIGH (ref 5–41)
Albumin/Globulin Ratio: 2 (ref 0.9–2.2)
Albumin: 4.5 g/dL (ref 3.5–5.0)
Alkaline Phosphatase: 73 U/L (ref 37–117)
Anion Gap: 10 (ref 5.0–15.0)
BUN: 18 mg/dL (ref 9.0–28.0)
Bilirubin, Total: 1.2 mg/dL (ref 0.2–1.2)
CO2: 26 mEq/L (ref 17–29)
Calcium: 9.5 mg/dL (ref 8.5–10.5)
Chloride: 101 mEq/L (ref 99–111)
Creatinine: 1.2 mg/dL (ref 0.5–1.5)
Globulin: 2.2 g/dL (ref 2.0–3.6)
Glucose: 99 mg/dL (ref 70–100)
Potassium: 4.2 mEq/L (ref 3.5–5.3)
Protein, Total: 6.7 g/dL (ref 6.0–8.3)
Sodium: 137 mEq/L (ref 135–145)
eGFR: >60 mL/min/{1.73_m2} (ref >=60)

## 2022-03-11 LAB — HEMOLYSIS INDEX: Hemolysis Index: 11 Index (ref 0–24)

## 2022-03-11 NOTE — Progress Notes (Signed)
Blood draw:  Using aseptic technique, blood was drawn from the right antecubital without any complication. Attempt # 1. Patient tolerated procedure well. All labs will be sent to [facility] refrigerated.    -1 SSTs      Vaccine given:  0.5 mL of Fluarix injected IM into right deltoid on 03/11/22. Allergies verified before procedure. Pt tolerated medication and procedure well and left in good condition. No waste is left. Verification done by Malachy Chamber, RN today.    Patient tolerated procedures well and left in good condition today.    Instructed to apply a cold compression to injection site if soreness, redness, itching or swelling occurs.    Discussed the possibility of soreness, redness, and swelling where the shot is given, fever, muscle aches, and headache can happen after influenza vaccination  Instructed to take tylenol or motrin if soreness occurs.   Informed to contact the office if redness, swelling or soreness persists after 5 days.  There may be a very small increased risk of Guillain-Barr Syndrome (GBS) after inactivated influenza vaccine (the flu shot). GBS is characterized by a rapid onset of numbness, weakness, and often paralysis.  VIS given to patient today.

## 2022-03-12 ENCOUNTER — Encounter: Payer: Self-pay | Admitting: Internal Medicine

## 2022-03-12 DIAGNOSIS — R7401 Elevation of levels of liver transaminase levels: Secondary | ICD-10-CM

## 2022-03-12 NOTE — Progress Notes (Signed)
Repeat CMP after slight bump of AST and ALT noted from last blood work.

## 2022-03-17 ENCOUNTER — Telehealth (INDEPENDENT_AMBULATORY_CARE_PROVIDER_SITE_OTHER): Payer: Self-pay

## 2022-03-17 NOTE — Telephone Encounter (Signed)
Attempted to call patient to confirm whether the 11/2 surgery date we picked works for him or not. Left voicemail asking him to give me a call back as soon as he can to let me know

## 2022-03-18 ENCOUNTER — Other Ambulatory Visit (INDEPENDENT_AMBULATORY_CARE_PROVIDER_SITE_OTHER): Payer: Self-pay | Admitting: Sports Medicine

## 2022-03-18 DIAGNOSIS — M75101 Unspecified rotator cuff tear or rupture of right shoulder, not specified as traumatic: Secondary | ICD-10-CM | POA: Insufficient documentation

## 2022-03-26 ENCOUNTER — Telehealth (INDEPENDENT_AMBULATORY_CARE_PROVIDER_SITE_OTHER): Payer: Self-pay

## 2022-03-26 NOTE — Telephone Encounter (Signed)
Patient called and canceled his surgery for 11/2, he said his wife's transplant surgery is scheduled for early December and he is the one taking care of her. He appreciated [place] finding time for him but he said he will call back early next year to find a time for surgery

## 2022-04-01 ENCOUNTER — Ambulatory Visit
Admission: RE | Admit: 2022-04-01 | Payer: No Typology Code available for payment source | Source: Ambulatory Visit | Admitting: Sports Medicine

## 2022-04-01 ENCOUNTER — Ambulatory Visit: Payer: No Typology Code available for payment source

## 2022-04-01 ENCOUNTER — Encounter: Admission: RE | Payer: Self-pay | Source: Ambulatory Visit

## 2022-04-01 DIAGNOSIS — M75101 Unspecified rotator cuff tear or rupture of right shoulder, not specified as traumatic: Secondary | ICD-10-CM | POA: Insufficient documentation

## 2022-04-01 SURGERY — ARTHROSCOPIC SHOULDER, ROTATOR CUFF REPAIR
Anesthesia: General | Site: Shoulder | Laterality: Left

## 2022-04-05 ENCOUNTER — Ambulatory Visit
Admission: RE | Admit: 2022-04-05 | Discharge: 2022-04-05 | Disposition: A | Discharge status: Home / Self Care | Payer: No Typology Code available for payment source | Source: Ambulatory Visit | Attending: Cardiovascular Disease | Admitting: Cardiovascular Disease

## 2022-04-05 DIAGNOSIS — I10 Essential (primary) hypertension: Secondary | ICD-10-CM | POA: Insufficient documentation

## 2022-04-07 ENCOUNTER — Telehealth (INDEPENDENT_AMBULATORY_CARE_PROVIDER_SITE_OTHER): Payer: Self-pay

## 2022-04-07 DIAGNOSIS — I6523 Occlusion and stenosis of bilateral carotid arteries: Secondary | ICD-10-CM

## 2022-04-07 NOTE — Telephone Encounter (Addendum)
Called patient, relayed carotid duplex results per Dr. Delbert Phenix. Patient verbalized understanding. Rec cont meds as prescribed and RFM/healthy lifestyle to prevent progression. Repeat study ordered.     ----- Message from Thompson Caul, MD sent at 04/06/2022  7:36 AM EST -----  Regarding: PV results: Please contact pt  Carotid ultrasound:    Plan: -repeat scan in 2 years    Findings: Mild bilateral plaque, <50% stenosis    Thank you

## 2022-04-16 ENCOUNTER — Ambulatory Visit
Admission: RE | Admit: 2022-04-16 | Discharge: 2022-04-16 | Disposition: A | Discharge status: Home / Self Care | Payer: No Typology Code available for payment source | Source: Ambulatory Visit | Attending: Internal Medicine | Admitting: Internal Medicine

## 2022-04-16 DIAGNOSIS — E559 Vitamin D deficiency, unspecified: Secondary | ICD-10-CM | POA: Insufficient documentation

## 2022-04-16 DIAGNOSIS — E7800 Pure hypercholesterolemia, unspecified: Secondary | ICD-10-CM | POA: Insufficient documentation

## 2022-04-16 DIAGNOSIS — Z0000 Encounter for general adult medical examination without abnormal findings: Secondary | ICD-10-CM | POA: Insufficient documentation

## 2022-04-16 DIAGNOSIS — I10 Essential (primary) hypertension: Secondary | ICD-10-CM | POA: Insufficient documentation

## 2022-04-16 DIAGNOSIS — Z6841 Body Mass Index (BMI) 40.0 and over, adult: Secondary | ICD-10-CM | POA: Insufficient documentation

## 2022-04-16 DIAGNOSIS — G4733 Obstructive sleep apnea (adult) (pediatric): Secondary | ICD-10-CM | POA: Insufficient documentation

## 2022-04-16 LAB — COMPREHENSIVE METABOLIC PANEL
ALT: 44 U/L (ref 0–55)
AST (SGOT): 27 U/L (ref 5–41)
Albumin/Globulin Ratio: 1.6 (ref 0.9–2.2)
Albumin: 4.2 g/dL (ref 3.5–5.0)
Alkaline Phosphatase: 87 U/L (ref 37–117)
Anion Gap: 9 (ref 5.0–15.0)
BUN: 23 mg/dL (ref 9.0–28.0)
Bilirubin, Total: 0.8 mg/dL (ref 0.2–1.2)
CO2: 24 mEq/L (ref 17–29)
Calcium: 9 mg/dL (ref 8.5–10.5)
Chloride: 107 mEq/L (ref 99–111)
Creatinine: 1.2 mg/dL (ref 0.5–1.5)
Globulin: 2.6 g/dL (ref 2.0–3.6)
Glucose: 115 mg/dL — ABNORMAL HIGH (ref 70–100)
Potassium: 3.9 mEq/L (ref 3.5–5.3)
Protein, Total: 6.8 g/dL (ref 6.0–8.3)
Sodium: 140 mEq/L (ref 135–145)
eGFR: >60 mL/min/{1.73_m2} (ref >=60)

## 2022-04-20 ENCOUNTER — Other Ambulatory Visit (INDEPENDENT_AMBULATORY_CARE_PROVIDER_SITE_OTHER): Payer: Self-pay | Admitting: Cardiovascular Disease

## 2022-04-20 ENCOUNTER — Other Ambulatory Visit: Payer: Self-pay | Admitting: Internal Medicine

## 2022-04-20 DIAGNOSIS — E782 Mixed hyperlipidemia: Secondary | ICD-10-CM

## 2022-04-20 DIAGNOSIS — Z0000 Encounter for general adult medical examination without abnormal findings: Secondary | ICD-10-CM

## 2022-04-20 MED ORDER — ATORVASTATIN CALCIUM 20 MG PO TABS
20.0000 mg | ORAL_TABLET | Freq: Every day | ORAL | 3 refills | Status: DC
Start: 2022-04-20 — End: 2022-06-04

## 2022-05-05 ENCOUNTER — Encounter: Payer: Self-pay | Admitting: Internal Medicine

## 2022-06-01 ENCOUNTER — Telehealth (INDEPENDENT_AMBULATORY_CARE_PROVIDER_SITE_OTHER): Payer: Self-pay

## 2022-06-01 NOTE — Telephone Encounter (Signed)
Patient left a voicemail wanting to schedule surgery. I tried calling him back but had to leave a voicemail

## 2022-06-04 ENCOUNTER — Other Ambulatory Visit (INDEPENDENT_AMBULATORY_CARE_PROVIDER_SITE_OTHER): Payer: Self-pay | Admitting: Cardiovascular Disease

## 2022-06-04 ENCOUNTER — Other Ambulatory Visit: Payer: Self-pay | Admitting: Geriatric Medicine

## 2022-06-04 DIAGNOSIS — Z0000 Encounter for general adult medical examination without abnormal findings: Secondary | ICD-10-CM

## 2022-06-04 DIAGNOSIS — E782 Mixed hyperlipidemia: Secondary | ICD-10-CM

## 2022-06-07 ENCOUNTER — Encounter: Payer: Self-pay | Admitting: Internal Medicine

## 2022-06-07 ENCOUNTER — Other Ambulatory Visit (INDEPENDENT_AMBULATORY_CARE_PROVIDER_SITE_OTHER): Payer: Self-pay | Admitting: Cardiovascular Disease

## 2022-06-07 DIAGNOSIS — R7989 Other specified abnormal findings of blood chemistry: Secondary | ICD-10-CM

## 2022-06-07 DIAGNOSIS — E7800 Pure hypercholesterolemia, unspecified: Secondary | ICD-10-CM

## 2022-06-07 MED ORDER — ATORVASTATIN CALCIUM 20 MG PO TABS
20.0000 mg | ORAL_TABLET | Freq: Every day | ORAL | 3 refills | Status: DC
Start: 2022-06-07 — End: 2022-08-23

## 2022-06-07 NOTE — Telephone Encounter (Signed)
A year supply was sent on 02/18/2022 to your pharmacy. Please contact your pharmacy .

## 2022-06-08 ENCOUNTER — Ambulatory Visit
Admission: RE | Admit: 2022-06-08 | Discharge: 2022-06-08 | Disposition: A | Discharge status: Home / Self Care | Payer: No Typology Code available for payment source | Source: Ambulatory Visit | Attending: Internal Medicine | Admitting: Internal Medicine

## 2022-06-08 DIAGNOSIS — R7989 Other specified abnormal findings of blood chemistry: Secondary | ICD-10-CM | POA: Insufficient documentation

## 2022-06-08 LAB — COMPREHENSIVE METABOLIC PANEL
ALT: 55 U/L (ref 0–55)
AST (SGOT): 29 U/L (ref 5–41)
Albumin/Globulin Ratio: 1.6 (ref 0.9–2.2)
Albumin: 4.3 g/dL (ref 3.5–5.0)
Alkaline Phosphatase: 70 U/L (ref 37–117)
Anion Gap: 10 (ref 5.0–15.0)
BUN: 20 mg/dL (ref 9.0–28.0)
Bilirubin, Total: 0.7 mg/dL (ref 0.2–1.2)
CO2: 22 mEq/L (ref 17–29)
Calcium: 9.4 mg/dL (ref 8.5–10.5)
Chloride: 104 mEq/L (ref 99–111)
Creatinine: 1.1 mg/dL (ref 0.5–1.5)
Globulin: 2.7 g/dL (ref 2.0–3.6)
Glucose: 117 mg/dL — ABNORMAL HIGH (ref 70–100)
Potassium: 4 mEq/L (ref 3.5–5.3)
Protein, Total: 7 g/dL (ref 6.0–8.3)
Sodium: 136 mEq/L (ref 135–145)
eGFR: >60 mL/min/{1.73_m2} (ref >=60)

## 2022-06-08 LAB — LIPID PANEL
Cholesterol / HDL Ratio: 5 Index
Cholesterol: 175 mg/dL (ref 0–199)
HDL: 35 mg/dL — ABNORMAL LOW (ref 40–9999)
LDL Calculated: 114 mg/dL — ABNORMAL HIGH (ref 0–99)
Triglycerides: 128 mg/dL (ref 34–149)
VLDL Calculated: 26 mg/dL (ref 10–40)

## 2022-06-08 LAB — HEMOLYSIS INDEX: Hemolysis Index: 13 Index (ref 0–24)

## 2022-06-08 NOTE — Addendum Note (Signed)
Addended by: Shelda Jakes on: 06/08/2022 12:02 PM     Modules accepted: Orders

## 2022-06-28 ENCOUNTER — Encounter (INDEPENDENT_AMBULATORY_CARE_PROVIDER_SITE_OTHER): Payer: Self-pay | Admitting: Sports Medicine

## 2022-07-01 HISTORY — PX: OTHER SURGICAL HISTORY: SHX169

## 2022-07-12 ENCOUNTER — Encounter (INDEPENDENT_AMBULATORY_CARE_PROVIDER_SITE_OTHER): Payer: Self-pay | Admitting: Sports Medicine

## 2022-07-13 ENCOUNTER — Encounter (INDEPENDENT_AMBULATORY_CARE_PROVIDER_SITE_OTHER): Payer: No Typology Code available for payment source | Admitting: Sports Medicine

## 2022-07-27 ENCOUNTER — Ambulatory Visit (AMBULATORY_SURGERY_CENTER): Payer: No Typology Code available for payment source | Admitting: Sports Medicine

## 2022-07-27 DIAGNOSIS — M7551 Bursitis of right shoulder: Secondary | ICD-10-CM

## 2022-07-27 DIAGNOSIS — M75101 Unspecified rotator cuff tear or rupture of right shoulder, not specified as traumatic: Secondary | ICD-10-CM

## 2022-07-27 DIAGNOSIS — M7542 Impingement syndrome of left shoulder: Secondary | ICD-10-CM

## 2022-07-27 DIAGNOSIS — M7541 Impingement syndrome of right shoulder: Secondary | ICD-10-CM

## 2022-07-27 DIAGNOSIS — M7552 Bursitis of left shoulder: Secondary | ICD-10-CM

## 2022-07-27 DIAGNOSIS — S46811A Strain of other muscles, fascia and tendons at shoulder and upper arm level, right arm, initial encounter: Secondary | ICD-10-CM

## 2022-07-27 DIAGNOSIS — M24111 Other articular cartilage disorders, right shoulder: Secondary | ICD-10-CM

## 2022-07-27 DIAGNOSIS — M94211 Chondromalacia, right shoulder: Secondary | ICD-10-CM

## 2022-07-27 DIAGNOSIS — M24112 Other articular cartilage disorders, left shoulder: Secondary | ICD-10-CM

## 2022-07-27 DIAGNOSIS — M66321 Spontaneous rupture of flexor tendons, right upper arm: Secondary | ICD-10-CM

## 2022-07-27 DIAGNOSIS — M7521 Bicipital tendinitis, right shoulder: Secondary | ICD-10-CM

## 2022-07-27 DIAGNOSIS — M75102 Unspecified rotator cuff tear or rupture of left shoulder, not specified as traumatic: Secondary | ICD-10-CM

## 2022-07-27 DIAGNOSIS — M7522 Bicipital tendinitis, left shoulder: Secondary | ICD-10-CM

## 2022-07-27 DIAGNOSIS — M94212 Chondromalacia, left shoulder: Secondary | ICD-10-CM

## 2022-07-27 DIAGNOSIS — S46812A Strain of other muscles, fascia and tendons at shoulder and upper arm level, left arm, initial encounter: Secondary | ICD-10-CM

## 2022-07-27 DIAGNOSIS — S46212A Strain of muscle, fascia and tendon of other parts of biceps, left arm, initial encounter: Secondary | ICD-10-CM

## 2022-07-27 MED ORDER — OXYCODONE HCL 5 MG PO TABS
5.0000 mg | ORAL_TABLET | ORAL | 0 refills | Status: AC | PRN
Start: 2022-07-27 — End: 2022-08-08

## 2022-07-27 MED ORDER — ONDANSETRON HCL 4 MG PO TABS
4.0000 mg | ORAL_TABLET | Freq: Four times a day (QID) | ORAL | 0 refills | Status: DC | PRN
Start: 2022-07-27 — End: 2022-08-23

## 2022-07-28 ENCOUNTER — Encounter (INDEPENDENT_AMBULATORY_CARE_PROVIDER_SITE_OTHER): Payer: No Typology Code available for payment source | Admitting: Sports Medicine

## 2022-07-29 ENCOUNTER — Other Ambulatory Visit (INDEPENDENT_AMBULATORY_CARE_PROVIDER_SITE_OTHER): Payer: Self-pay

## 2022-07-29 DIAGNOSIS — M75122 Complete rotator cuff tear or rupture of left shoulder, not specified as traumatic: Secondary | ICD-10-CM

## 2022-07-30 ENCOUNTER — Encounter (INDEPENDENT_AMBULATORY_CARE_PROVIDER_SITE_OTHER): Payer: Self-pay | Admitting: Sports Medicine

## 2022-08-02 ENCOUNTER — Encounter (INDEPENDENT_AMBULATORY_CARE_PROVIDER_SITE_OTHER): Payer: Self-pay | Admitting: Sports Medicine

## 2022-08-03 ENCOUNTER — Telehealth (INDEPENDENT_AMBULATORY_CARE_PROVIDER_SITE_OTHER): Payer: Self-pay

## 2022-08-03 NOTE — Telephone Encounter (Signed)
Patient left me a voicemail asking him to call him back, I attempted to call but had to leave a voicemail

## 2022-08-04 ENCOUNTER — Encounter (INDEPENDENT_AMBULATORY_CARE_PROVIDER_SITE_OTHER): Payer: Self-pay

## 2022-08-11 ENCOUNTER — Ambulatory Visit (INDEPENDENT_AMBULATORY_CARE_PROVIDER_SITE_OTHER): Payer: No Typology Code available for payment source | Admitting: Sports Medicine

## 2022-08-11 ENCOUNTER — Inpatient Hospital Stay: Payer: No Typology Code available for payment source

## 2022-08-11 ENCOUNTER — Encounter (INDEPENDENT_AMBULATORY_CARE_PROVIDER_SITE_OTHER): Payer: Self-pay | Admitting: Sports Medicine

## 2022-08-11 DIAGNOSIS — M75101 Unspecified rotator cuff tear or rupture of right shoulder, not specified as traumatic: Secondary | ICD-10-CM

## 2022-08-11 NOTE — Progress Notes (Signed)
[facility]       Provider: Leonides Schanz, MD       Date of Exam:  08/11/2022   Patient:  Guy Martinez  DOB:  [birth year]    AGE:  52 y.o.  MR#:  [id number]     Diagnosis: s/p left shoulder arthroscopy w/RCR, ED, SAD, OBT (DOS: 07/27/22)    HPI: Sonja returns,  15  days status post left shoulder arthroscopy as described above. He has been compliant with all post-operative instructions to date. He has started and been attending formal physical therapy as previously prescribed. Vidyuth is here today for his first regularly scheduled post-operative visit.    For other past medical history, social history, family history and past surgical history please see them listed below.    Club/School/Work Affiliation: n/a    Problem List:   Patient Active Problem List   Diagnosis    HLD (hyperlipidemia)    Morbid obesity    HTN (hypertension)    Actinic keratosis    Sebaceous cyst    Overweight (BMI 25.0-29.9)    Alopecia    Disorder of hair follicle    Melanocytic nevi of trunk    Tinea pedis    Seborrheic keratosis    Vitamin D deficiency    OSA (obstructive sleep apnea)    Lung nodule    Encounter for screening and preventative care    Tear of right rotator cuff, unspecified tear extent, unspecified whether traumatic       Past Medical History:    Past Medical History:   Diagnosis Date    H/O Coronary CT Angiogram 10/2010    H/O echocardiogram 10/2014    H/O stress echocardiogram 02/2009    normal    H/O treadmill stress test 01/2008, 05/2010, 11/2015, 12/12/15    Hyperlipidemia     MANAGED WITH MEDS.    Hypertension     controlled w/med per pt    Sleep apnea     +OSA - USES CPAP NIGHTLY.       Social History:   Social History     Tobacco Use    Smoking status: Former     Packs/day: 0.25     Years: 2.00     Additional pack years: 0.00     Total pack years: 0.50     Types: Cigarettes     Quit date: 03/05/2011     Years since quitting: 11.4    Smokeless tobacco: Former     Types: Snuff     Quit date: 05/31/2012    Tobacco  comments:     Social use. 5-7 cig/week   Vaping Use    Vaping Use: Never used   Substance Use Topics    Alcohol use: Not Currently     Alcohol/week: 7.0 standard drinks of alcohol     Types: 7 Drinks containing 0.5 oz of alcohol per week     Comment: < weekly basis    Drug use: Never       Family History:   Family History   Problem Relation Age of Onset    Diverticulitis Mother     Cancer Father 11        Prostate, Bladder    Heart attack Father 25        4 MI's    Stroke Father 84        2 strokes    Hypertension Father     Diabetes Father     Hyperlipidemia Father  Heart disease Father     Myocardial Infarction Father     No known problems Sister     No known problems Brother     No known problems Paternal Grandmother     COPD Paternal Grandfather     Heart attack Paternal Grandfather     Diabetes Paternal Grandfather     Heart disease Paternal Grandparent     Diabetes Paternal Grandparent     Emphysema Paternal Grandparent         pulmonary    No known problems Son     Appendicitis Son     Asthma Son     No known problems Son     Pancreatic cancer Paternal Uncle 31       Past Surgical History:    Past Surgical History:   Procedure Laterality Date    COLONOSCOPY N/A 12/03/2021    Repeat 5 years. Tubular Adenoma;  Surgeon: Trilby Leaver, MD;  [facility];  Service: Gastroenterology;    WISDOM TOOTH EXTRACTION         Medications:  Scheduled Meds:  Continuous Infusions:  PRN Meds:.     Allergies:  No Known Allergies    ROS:  Constitutional: No fatigue, fever, weight loss, or weight gain.   Ears, Nose, Mouth & Throat: No sore throat or hearing loss.   Cardiovascular: No chest pain, blood clots, or leg cramps.   Respiratory: No shortness of breath, cough, or difficulty breathing.   Gastrointestinal: No nausea, vomiting, diarrhea, or loss of appetite.   Genitourinary: No polyuria or kidney disease.   Musculoskeletal: No joint aches, muscle weakness or swelling of joints/body parts other than that  mentioned above.  Integumentary: No finger nail changes or skin dryness.   Neurological: No numbness, burning discomfort, or headaches.   Psychiatric: No depression or anxiety.   Endocrine: No increased thirst, change in appetite or thyroid disease.   Hematologic/Lymphatic: No easy bruising or anemia.     Exam:   Left Shoulder Exam:  Incisions are well-healed, clean, dry and intact.  No effusion.  Mild swelling.  No tenderness to palpation.   ROM: deferred  Strength: deferred.   No other areas of tenderness.  DTR and Pathological Reflexes Intact  No lymphadenopathy  Sensation Intact to light touch all distributions of arm and hand  Radial/Median/Ulnar Nerves intact to motor and sensory provocation  Hand Perfused with CR <2 sec  Radial pulse 2+    Vitals: There were no vitals taken for this visit.    General: Derral was awake, alert, oriented, easily engaged, displayed logical thinking with clear speech and was neat in appearance. His general appearance was normal, well-developed and well-nourished.    Gait: The patient demonstrated a non-antalgic gait with intact coordination and balance.    Studies: No studies obtained on today's visit.    Assessment/Plan: Sutures were removed and wound care instructions and precautions were given. The patient will continue physical therapy and therapeutic exercises per protocol. Patient was instructed on the dos and don'ts regarding protection of the surgical procedure, as well as appropriate activity levels going forward. Nael should remain in his sling for an additional 2 weeks at which time he can start to wean out of it. Xaiden understands the importance of a dedicated therapy program. The patient will notify my clinic of any changes or worsening of their symptoms during the interim. We have reviewed the operative pictures with Georgie and all the patient's questions were answered appropriately  and completely. We will plan on follow up in 4 weeks. Alvern understands the plan.      The review of the patient's medications does not in any way constitute an endorsement, by this clinician,  of their use, dosage, indications, route, efficacy, interactions, or other clinical parameters.    This note may have been generated in part or in whole within the [employer] EMR using Dragon medical speech recognition software and may contain inherent errors or omissions not intended by the user. Grammatical and punctuation errors, random word insertions, deletions, pronoun errors and incomplete sentences are occasional consequences of this technology due to software limitations. Not all errors are caught or corrected.  Although every attempt is made to root out erroneus and incomplete transcription, the note may still not fully represent the intent or opinion of the author. If there are questions or concerns about the content of this note or information contained within the body of this dictation they should be addressed directly with the author for clarification.     Scherrie Merritts, ATC personally performed scribing services for Dr. Ahmed Prima for Marcial Pacas on August 11, 2022     [facility] ATC, acted as a Education administrator for this encounter for Dr. Philomena Course. I have reviewed and edited this note when appropriate and agree with the documentation

## 2022-08-23 ENCOUNTER — Encounter (INDEPENDENT_AMBULATORY_CARE_PROVIDER_SITE_OTHER): Payer: Self-pay | Admitting: Cardiovascular Disease

## 2022-08-23 ENCOUNTER — Ambulatory Visit (INDEPENDENT_AMBULATORY_CARE_PROVIDER_SITE_OTHER): Payer: No Typology Code available for payment source | Admitting: Cardiovascular Disease

## 2022-08-23 VITALS — BP 128/72 | HR 74 | Ht 71.0 in | Wt 284.0 lb

## 2022-08-23 DIAGNOSIS — I6523 Occlusion and stenosis of bilateral carotid arteries: Secondary | ICD-10-CM

## 2022-08-23 DIAGNOSIS — Z0000 Encounter for general adult medical examination without abnormal findings: Secondary | ICD-10-CM

## 2022-08-23 DIAGNOSIS — E7800 Pure hypercholesterolemia, unspecified: Secondary | ICD-10-CM

## 2022-08-23 DIAGNOSIS — E782 Mixed hyperlipidemia: Secondary | ICD-10-CM

## 2022-08-23 DIAGNOSIS — I10 Essential (primary) hypertension: Secondary | ICD-10-CM

## 2022-08-23 DIAGNOSIS — G4733 Obstructive sleep apnea (adult) (pediatric): Secondary | ICD-10-CM

## 2022-08-23 DIAGNOSIS — Z8249 Family history of ischemic heart disease and other diseases of the circulatory system: Secondary | ICD-10-CM

## 2022-08-23 MED ORDER — ATORVASTATIN CALCIUM 40 MG PO TABS
40.0000 mg | ORAL_TABLET | Freq: Every day | ORAL | 3 refills | Status: DC
Start: 2022-08-23 — End: 2023-05-04

## 2022-08-23 NOTE — Progress Notes (Signed)
[facility] OFF[facility] PROGRESS NOTE    HRT [facility] OFFICE -CARDIOLOGY  [address]  [place] [address]  Dept: [phone]  Dept Fax: [phone]       Patient Name: Guy Martinez, Guy Martinez    Date of Visit:  August 23, 2022  Date of Birth: 12/12/1970  AGE: 52 y.o.  Medical Record #: [id number]  Requesting Physician: Shelda Jakes, MD      CHIEF COMPLAINT: Hyperlipidemia        HISTORY OF PRESENT ILLNESS:    He is a pleasant 52 y.o. male who presents today for a follow-up visit.     Always a pleasure seeing Guy Martinez.  Fortunately his wife underwent successful liver transplant and appears to be doing quite well.  Unfortunately he recently sustained a left shoulder injury which has curtailed his exercise.  No new angina or dyspnea.  Otherwise no acute complaints.    PAST MEDICAL HISTORY: He has a past medical history of H/O Coronary CT Angiogram (10/2010), H/O echocardiogram (10/2014), H/O stress echocardiogram (02/2009), H/O treadmill stress test (01/2008, 05/2010, 11/2015, 12/12/15), Hyperlipidemia, Hypertension, and Sleep apnea. He has a past surgical history that includes Wisdom tooth extraction and Colonoscopy (N/A, 12/03/2021).    ALLERGIES: No Known Allergies    MEDICATIONS:     Current Outpatient Medications:     losartan-hydrochlorothiazide (HYZAAR) 100-25 MG per tablet, Take 1 tablet by mouth daily, Disp: 90 tablet, Rfl: 3    atorvastatin (LIPITOR) 40 MG tablet, Take 1 tablet (40 mg) by mouth daily, Disp: 90 tablet, Rfl: 3       FAMILY HISTORY: family history includes Appendicitis in his son; Asthma in his son; COPD in his paternal grandfather; Cancer (age of onset: 55) in his father; Diabetes in his father, paternal grandfather, and paternal grandparent; Diverticulitis in his mother; Emphysema in his paternal grandparent; Heart attack in his paternal grandfather; Heart attack (age of onset: 24) in his father; Heart disease in his father and paternal  grandparent; Hyperlipidemia in his father; Hypertension in his father; Myocardial Infarction in his father; No known problems in his brother, paternal grandmother, sister, son, and son; Pancreatic cancer (age of onset: 30) in his paternal uncle; Stroke (age of onset: 58) in his father.    SOCIAL HISTORY: He reports that he quit smoking about 11 years ago. His smoking use included cigarettes. He started smoking about 13 years ago. He has a 0.5 pack-year smoking history. He quit smokeless tobacco use about 10 years ago.  His smokeless tobacco use included snuff. He reports that he does not currently use alcohol after a past usage of about 7.0 standard drinks of alcohol per week. He reports that he does not use drugs.    PHYSICAL EXAMINATION    Visit Vitals  BP 128/72 (BP Site: Right arm, Patient Position: Sitting, Cuff Size: Large)   Pulse 74   Ht 1.803 m (5\' 11" )   Wt 128.8 kg (284 lb)   BMI 39.61 kg/m       GENERAL: Patient is in no acute distress   HEENT: No scleral icterus or conjunctival pallor, moist mucous membranes   NECK: No jugular venous distention or thyromegaly, normal carotid upstrokes without bruits   CARDIAC: Regular rate and rhythm, with normal S1 and S2, and no murmurs, rubs, or gallops   CHEST: Clear to auscultation bilaterally, normal respiratory effort  ABDOMEN: Soft, nontender, non-distended, normoactive bowel sounds   EXTREMITIES: No clubbing,  cyanosis, or edema, 2+ DP  SKIN: No rash or jaundice   NEUROLOGIC: Alert and oriented to time, place and person, normal mood and affect   MUSCULOSKELETAL: Normal muscle strength and tone.          LABS:   Lab Results   Component Value Date    WBC 4.9 02/16/2022    HGB 13.8 02/16/2022    HCT 39.7 02/16/2022    PLT 188 02/16/2022     Lab Results   Component Value Date    GLU 117 (H) 06/08/2022    BUN 20.0 06/08/2022    CREAT 1.1 06/08/2022    NA 136 06/08/2022    K 4.0 06/08/2022    CL 104 06/08/2022    CO2 22 06/08/2022    AST 29 06/08/2022    ALT 55  06/08/2022     Lab Results   Component Value Date    MG 2.2 08/10/2017    TSH 2.84 02/16/2022    HGBA1C 5.0 02/16/2022    BNP 15.9 08/10/2017     Lab Results   Component Value Date    CHOL 175 06/08/2022    TRIG 128 06/08/2022    HDL 35 (L) 06/08/2022    LDL 114 (H) 06/08/2022           Most recent echo and nuclear study reviewed.      IMPRESSION:   Guy Martinez is a 52 y.o. 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 CAC 126, 92nd percentile 12/2019.  Asymptomatic carotid atherosclerosis less than 50% bilateral in 03/2022  Hypertension-adequately controlled today.  Continue current pharmacotherapy  Hyperlipidemia-on atorvastatin 20 mg daily with lipids above goal  Family history of coronary disease-father had his first event in his 50s.  Normal ETT in 2021.  Overweight with recent weight gain  Sleep apnea on CPAP    RECOMMENDATIONS:    Given evidence of atherosclerotic disease, lipids are not at level with LDL 114.  Recommend increasing atorvastatin to 40 mg daily.  CMP and lipid profile today and again in 3 months.  Follow-up in about 6 months.  Discussed the importance of weight loss.  Offered referral to [facility] physician wellness.  Patient would like to try diet and lifestyle modification on his own first                                                 Orders Placed This Encounter   Procedures    Lipid panel    Comprehensive metabolic panel    Comprehensive metabolic panel    Lipid panel    Office Visit (HRT [place])       Orders Placed This Encounter   Medications    atorvastatin (LIPITOR) 40 MG tablet     Sig: Take 1 tablet (40 mg) by mouth daily     Dispense:  90 tablet     Refill:  3         SIGNED:    Braxton Feathers, MD          This note was generated by the Dragon speech recognition and may contain errors or omissions not intended by the user. Grammatical errors, random word insertions, deletions, pronoun errors, and incomplete  sentences are occasional consequences of this technology due to software  limitations. Not all errors are caught or corrected. If there are questions or concerns about the content of this note or information contained within the body of this dictation, they should be addressed directly with the author for clarification.

## 2022-08-31 ENCOUNTER — Encounter (INDEPENDENT_AMBULATORY_CARE_PROVIDER_SITE_OTHER): Payer: No Typology Code available for payment source | Admitting: Sports Medicine

## 2022-09-08 ENCOUNTER — Encounter (INDEPENDENT_AMBULATORY_CARE_PROVIDER_SITE_OTHER): Payer: Self-pay | Admitting: Sports Medicine

## 2022-09-08 ENCOUNTER — Ambulatory Visit (INDEPENDENT_AMBULATORY_CARE_PROVIDER_SITE_OTHER): Payer: No Typology Code available for payment source | Admitting: Sports Medicine

## 2022-09-08 ENCOUNTER — Ambulatory Visit
Admission: RE | Admit: 2022-09-08 | Discharge: 2022-09-08 | Disposition: A | Discharge status: Home / Self Care | Payer: No Typology Code available for payment source | Source: Ambulatory Visit | Attending: Cardiovascular Disease | Admitting: Cardiovascular Disease

## 2022-09-08 VITALS — BP 149/90 | HR 75 | Ht 71.0 in | Wt 284.0 lb

## 2022-09-08 DIAGNOSIS — M75101 Unspecified rotator cuff tear or rupture of right shoulder, not specified as traumatic: Secondary | ICD-10-CM

## 2022-09-08 DIAGNOSIS — E7800 Pure hypercholesterolemia, unspecified: Secondary | ICD-10-CM | POA: Insufficient documentation

## 2022-09-08 DIAGNOSIS — E782 Mixed hyperlipidemia: Secondary | ICD-10-CM | POA: Insufficient documentation

## 2022-09-08 LAB — LIPID PANEL
Cholesterol / HDL Ratio: 5.7 Index
Cholesterol: 166 mg/dL (ref 0–199)
HDL: 29 mg/dL — ABNORMAL LOW (ref 40–9999)
LDL Calculated: 100 mg/dL — AB (ref 0–99)
Triglycerides: 184 mg/dL — ABNORMAL HIGH (ref 34–149)
VLDL Calculated: 37 mg/dL (ref 10–40)

## 2022-09-08 LAB — COMPREHENSIVE METABOLIC PANEL
ALT: 76 U/L — ABNORMAL HIGH (ref 0–55)
AST (SGOT): 44 U/L — ABNORMAL HIGH (ref 5–41)
Albumin/Globulin Ratio: 2 (ref 0.9–2.2)
Albumin: 4.6 g/dL (ref 3.5–5.0)
Alkaline Phosphatase: 60 U/L (ref 37–117)
Anion Gap: 9 (ref 5.0–15.0)
BUN: 25 mg/dL (ref 9.0–28.0)
Bilirubin, Total: 0.9 mg/dL (ref 0.2–1.2)
CO2: 24 mEq/L (ref 17–29)
Calcium: 10.1 mg/dL (ref 8.5–10.5)
Chloride: 106 mEq/L (ref 99–111)
Creatinine: 1.3 mg/dL (ref 0.5–1.5)
Globulin: 2.3 g/dL (ref 2.0–3.6)
Glucose: 102 mg/dL — ABNORMAL HIGH (ref 70–100)
Potassium: 4.1 mEq/L (ref 3.5–5.3)
Protein, Total: 6.9 g/dL (ref 6.0–8.3)
Sodium: 139 mEq/L (ref 135–145)
eGFR: >60 mL/min/{1.73_m2} (ref >=60)

## 2022-09-08 LAB — HEMOLYSIS INDEX(SOFT): Hemolysis Index: 3 Index (ref 0–24)

## 2022-09-08 NOTE — Progress Notes (Signed)
Guy Martinez    Provider: Valera Castle, MD  Date of Exam:  09/08/2022   Patient:  Guy Martinez  DOB:  Mar 13, 1971    AGE:  52 y.o.  MR#:  [id number]     Chief Complaint: Left shoulder pain.    HPI:  Guy Martinez is a pleasant 51 y.o.-year-old left  hand dominant male who presents today with a history of left shoulder pain. Guy Martinez has been having pain for the last 10 months after wrestling with his son. Patient underwent left shoulder rotator cuff repair, ED, SAD, OBT on 07/27/2022. States his recovery has been going well and only gets episodic pain in the left shoulder with certain movements (abduction). he rates the pain at a 3/10 on today's visit. He denies any antecedent history of injury. His pain is localized to the lateral aspect of the left shoulder. His pain is made worse with abduction of the left shoulder.  Guy Martinez's symptoms are improved with rest. He has tried Ibuprofen/Tylenol with little improvement.  He reports improvement in left shoulder strength over the past 6 weeks. He has attempted modification of activity with little improvement. He has attempted 5 weeks of formal physical therapy with significant improvement. Guy Martinez is now here for follow-up post-operatively.     For other past medical history, social history, family history and past surgical history please see them listed below.    Problem List:   Patient Active Problem List   Diagnosis    HLD (hyperlipidemia)    Morbid obesity    HTN (hypertension)    Actinic keratosis    Sebaceous cyst    Overweight (BMI 25.0-29.9)    Alopecia    Disorder of hair follicle    Melanocytic nevi of trunk    Tinea pedis    Seborrheic keratosis    Vitamin D deficiency    OSA (obstructive sleep apnea)    Lung nodule    Encounter for screening and preventative care    Tear of right rotator cuff, unspecified tear extent, unspecified whether traumatic        Past Medical History:    Past Medical History:   Diagnosis Date    H/O Coronary CT Angiogram 10/2010    H/O  echocardiogram 10/2014    H/O stress echocardiogram 02/2009    normal    H/O treadmill stress test 01/2008, 05/2010, 11/2015, 12/12/15    Hyperlipidemia     MANAGED WITH MEDS.    Hypertension     controlled w/med per pt    Sleep apnea     +OSA - USES CPAP NIGHTLY.       Social History:   Social History     Tobacco Use    Smoking status: Former     Current packs/day: 0.00     Average packs/day: 0.3 packs/day for 2.0 years (0.5 ttl pk-yrs)     Types: Cigarettes     Start date: 03/04/2009     Quit date: 03/05/2011     Years since quitting: 11.5    Smokeless tobacco: Former     Types: Snuff     Quit date: 05/31/2012    Tobacco comments:     Social use. 5-7 cig/week   Vaping Use    Vaping status: Never Used   Substance Use Topics    Alcohol use: Not Currently     Alcohol/week: 7.0 standard drinks of alcohol     Types: 7 Drinks containing 0.5 oz of alcohol per week  Comment: < weekly basis    Drug use: Never       Family History:   Family History   Problem Relation Age of Onset    Diverticulitis Mother     Cancer Father 32        Prostate, Bladder    Heart attack Father 54        4 MI's    Stroke Father 72        2 strokes    Hypertension Father     Diabetes Father     Hyperlipidemia Father     Heart disease Father     Myocardial Infarction Father     No known problems Sister     No known problems Brother     No known problems Paternal Grandmother     COPD Paternal Grandfather     Heart attack Paternal Grandfather     Diabetes Paternal Grandfather     Heart disease Paternal Grandparent     Diabetes Paternal Grandparent     Emphysema Paternal Grandparent         pulmonary    No known problems Son     Appendicitis Son     Asthma Son     No known problems Son     Pancreatic cancer Paternal Uncle 38       Past Surgical History:    Past Surgical History:   Procedure Laterality Date    COLONOSCOPY N/A 12/03/2021    Repeat 5 years. Tubular Adenoma;  Surgeon: Denny Levy, MD;  Location: [facility];  Service: Gastroenterology;     WISDOM TOOTH EXTRACTION         Medications:  Scheduled Meds:  Continuous Infusions:  PRN Meds:.    Current Outpatient Medications:     atorvastatin (LIPITOR) 40 MG tablet, Take 1 tablet (40 mg) by mouth daily, Disp: 90 tablet, Rfl: 3    losartan-hydrochlorothiazide (HYZAAR) 100-25 MG per tablet, Take 1 tablet by mouth daily, Disp: 90 tablet, Rfl: 3     Allergies:  No Known Allergies    ROS:  Constitutional: No fatigue, fever, weight loss, or weight gain.   Ears, Nose, Mouth & Throat: No sore throat or hearing loss.   Cardiovascular: No chest pain, blood clots, or leg cramps.   Respiratory: No shortness of breath, cough, or difficulty breathing.   Gastrointestinal: No nausea, vomiting, diarrhea, or loss of appetite.   Genitourinary: No polyuria or kidney disease.   Musculoskeletal: No joint aches, muscle weakness or swelling of joints/body parts other than that mentioned above.  Integumentary: No finger nail changes or skin dryness.   Neurological: No numbness, burning discomfort, or headaches.   Psychiatric: No depression or anxiety.   Endocrine: No increased thirst, change in appetite or thyroid disease.   Hematologic/Lymphatic: No easy bruising or anemia.     Exam:   Left Shoulder Exam:  Incisions are well-healed, clean, dry and intact.  No effusion.  Mild swelling.  No tenderness to palpation.   ROM: deferred  Strength: deferred.   No other areas of tenderness.  DTR and Pathological Reflexes Intact  No lymphadenopathy  Sensation Intact to light touch all distributions of arm and hand  Radial/Median/Ulnar Nerves intact to motor and sensory provocation  Hand Perfused with CR <2 sec  Radial pulse 2+    Vitals: BP 149/90 Comment: pt denies symptoms, states the machines usually make his BP higher  Pulse 75   Ht 1.803 m (5\' 11" )  Wt 128.8 kg (284 lb)   BMI 39.61 kg/m     General: Charvez was awake, alert, oriented, easily engaged, displayed logical thinking with clear speech and was neat in appearance. His  general appearance was normal, well-developed and well-nourished.    Gait: The patient demonstrated a non-antalgic gait with intact coordination and balance.     Studies: No studies done on this visit    Assessment/Plan: Arhum is a pleasant 52 y.o. male presenting for post-operative follow-up.  The patient is making good progress with his left shoulder ROM and strength with his physical therapy 3x weekly. I advised patient to continue with physical therapy. I have asked the patient to avoid activities that exacerbate or worsen his symptoms.  We reviewed the use of medications such as Tylenol and NSAIDS. All questions were answered to the patient's satisfaction.  The patient will notify my clinic of any changes or worsening of their symptoms during the interim.  We will plan on follow up in 6 weeks.    The review of the patient's medications does not in any way constitute an endorsement, by this clinician,  of their use, dosage, indications, route, efficacy, interactions, or other clinical parameters.    This note may have been generated in part or in whole within the EPIC EMR using Dragon medical speech recognition software and may contain inherent errors or omissions not intended by the user. Grammatical and punctuation errors, random word insertions, deletions, pronoun errors and incomplete sentences are occasional consequences of this technology due to software limitations. Not all errors are caught or corrected.  Although every attempt is made to root out erroneus and incomplete transcription, the note may still not fully represent the intent or opinion of the author. If there are questions or concerns about the content of this note or information contained within the body of this dictation they should be addressed directly with the author for clarification.    I, Valera Castle, MD, have personally performed the history, physical exam and medical decision making; and confirmed the accuracy of the information  in the transcribed note.    Date: 09/08/2022 Time: 4:10 PM

## 2022-09-09 ENCOUNTER — Encounter (INDEPENDENT_AMBULATORY_CARE_PROVIDER_SITE_OTHER): Payer: Self-pay | Admitting: Cardiovascular Disease

## 2022-09-10 ENCOUNTER — Other Ambulatory Visit: Payer: Self-pay | Admitting: Internal Medicine

## 2022-09-10 ENCOUNTER — Encounter: Payer: Self-pay | Admitting: Internal Medicine

## 2022-09-10 DIAGNOSIS — E7800 Pure hypercholesterolemia, unspecified: Secondary | ICD-10-CM

## 2022-09-10 DIAGNOSIS — R7989 Other specified abnormal findings of blood chemistry: Secondary | ICD-10-CM

## 2022-09-10 NOTE — Progress Notes (Signed)
Called pt re: message from pt and response from Dr Dimple Casey. LVMTCB

## 2022-09-10 NOTE — Progress Notes (Signed)
Repeat CMP in 2 weeks after slight bump noted after increasing statin dose.

## 2022-09-14 NOTE — Progress Notes (Signed)
See notes from PCP, pt getting repeat labs per office.

## 2022-09-15 ENCOUNTER — Ambulatory Visit (INDEPENDENT_AMBULATORY_CARE_PROVIDER_SITE_OTHER): Payer: No Typology Code available for payment source

## 2022-09-15 ENCOUNTER — Encounter (INDEPENDENT_AMBULATORY_CARE_PROVIDER_SITE_OTHER): Payer: No Typology Code available for payment source | Admitting: Sports Medicine

## 2022-09-15 DIAGNOSIS — E7800 Pure hypercholesterolemia, unspecified: Secondary | ICD-10-CM

## 2022-09-15 DIAGNOSIS — R7989 Other specified abnormal findings of blood chemistry: Secondary | ICD-10-CM

## 2022-09-15 LAB — COMPREHENSIVE METABOLIC PANEL
ALT: 65 U/L — ABNORMAL HIGH (ref 0–55)
AST (SGOT): 35 U/L (ref 5–41)
Albumin/Globulin Ratio: 2.1 (ref 0.9–2.2)
Albumin: 5 g/dL (ref 3.5–5.0)
Alkaline Phosphatase: 64 U/L (ref 37–117)
Anion Gap: 9 (ref 5.0–15.0)
BUN: 20 mg/dL (ref 9.0–28.0)
Bilirubin, Total: 1.2 mg/dL (ref 0.2–1.2)
CO2: 26 mEq/L (ref 17–29)
Calcium: 10.1 mg/dL (ref 8.5–10.5)
Chloride: 102 mEq/L (ref 99–111)
Creatinine: 1.2 mg/dL (ref 0.5–1.5)
Globulin: 2.4 g/dL (ref 2.0–3.6)
Glucose: 90 mg/dL (ref 70–100)
Potassium: 4.2 mEq/L (ref 3.5–5.3)
Protein, Total: 7.4 g/dL (ref 6.0–8.3)
Sodium: 137 mEq/L (ref 135–145)
eGFR: >60 mL/min/{1.73_m2} (ref >=60)

## 2022-09-15 LAB — HEMOLYSIS INDEX(SOFT): Hemolysis Index: 7 Index (ref 0–24)

## 2022-09-15 NOTE — Progress Notes (Signed)
Blood draw:  Using aseptic technique, blood was drawn from the left antecubital without any complication. Attempt # 2. Patient tolerated procedure well. All labs will be sent to [facility] refrigerated.    -1 SSTs      Patient tolerated procedure well and left in good condition today.

## 2022-09-16 NOTE — Progress Notes (Signed)
Order of RUQ [place] for intermittent and transient elevations of LFTs.

## 2022-09-17 ENCOUNTER — Encounter: Payer: Self-pay | Admitting: Internal Medicine

## 2022-09-17 ENCOUNTER — Other Ambulatory Visit (INDEPENDENT_AMBULATORY_CARE_PROVIDER_SITE_OTHER): Payer: Self-pay | Admitting: Cardiovascular Disease

## 2022-09-17 NOTE — Telephone Encounter (Signed)
Pt had medication refilled for a years supply on 02/18/2022.

## 2022-09-20 ENCOUNTER — Encounter: Payer: Self-pay | Admitting: Internal Medicine

## 2022-09-20 DIAGNOSIS — J069 Acute upper respiratory infection, unspecified: Secondary | ICD-10-CM

## 2022-09-20 MED ORDER — AMOXICILLIN-POT CLAVULANATE 875-125 MG PO TABS
1.0000 | ORAL_TABLET | Freq: Two times a day (BID) | ORAL | 0 refills | Status: AC
Start: 2022-09-20 — End: 2022-09-27

## 2022-09-20 NOTE — Progress Notes (Signed)
Pt messaged about URI. Recommended normal saline rinses, flonase, OTC cough suppressant, and nsaids (encouraged good hydration and PO intake)/Tylenol (3gm limit). If symptoms do not improve with conservative measures called in a course of augmentin. Wife is transplant patient and is high risk for infections.

## 2022-09-22 ENCOUNTER — Ambulatory Visit
Admission: RE | Admit: 2022-09-22 | Discharge: 2022-09-22 | Disposition: A | Discharge status: Home / Self Care | Payer: No Typology Code available for payment source | Source: Ambulatory Visit | Attending: Internal Medicine | Admitting: Internal Medicine

## 2022-09-22 ENCOUNTER — Encounter: Payer: Self-pay | Admitting: Internal Medicine

## 2022-09-22 DIAGNOSIS — R7989 Other specified abnormal findings of blood chemistry: Secondary | ICD-10-CM | POA: Insufficient documentation

## 2022-10-01 ENCOUNTER — Other Ambulatory Visit (FREE_STANDING_LABORATORY_FACILITY): Payer: No Typology Code available for payment source

## 2022-10-01 DIAGNOSIS — E782 Mixed hyperlipidemia: Secondary | ICD-10-CM

## 2022-10-01 DIAGNOSIS — E7800 Pure hypercholesterolemia, unspecified: Secondary | ICD-10-CM

## 2022-10-01 LAB — COMPREHENSIVE METABOLIC PANEL
ALT: 54 U/L (ref 0–55)
AST (SGOT): 35 U/L (ref 5–41)
Albumin/Globulin Ratio: 2.1 (ref 0.9–2.2)
Albumin: 4.6 g/dL (ref 3.5–5.0)
Alkaline Phosphatase: 73 U/L (ref 37–117)
Anion Gap: 6 (ref 5.0–15.0)
BUN: 24 mg/dL (ref 9.0–28.0)
Bilirubin, Total: 0.7 mg/dL (ref 0.2–1.2)
CO2: 28 mEq/L (ref 17–29)
Calcium: 9.6 mg/dL (ref 8.5–10.5)
Chloride: 103 mEq/L (ref 99–111)
Creatinine: 1.3 mg/dL (ref 0.5–1.5)
Globulin: 2.2 g/dL (ref 2.0–3.6)
Glucose: 102 mg/dL — ABNORMAL HIGH (ref 70–100)
Potassium: 3.9 mEq/L (ref 3.5–5.3)
Protein, Total: 6.8 g/dL (ref 6.0–8.3)
Sodium: 137 mEq/L (ref 135–145)
eGFR: >60 mL/min/{1.73_m2} (ref >=60)

## 2022-10-01 LAB — HEMOLYSIS INDEX(SOFT): Hemolysis Index: 4 Index (ref 0–24)

## 2022-10-01 LAB — LIPID PANEL
Cholesterol / HDL Ratio: 4.9 Index
Cholesterol: 128 mg/dL (ref 0–199)
HDL: 26 mg/dL — ABNORMAL LOW (ref 40–9999)
LDL Calculated: 64 mg/dL (ref 0–99)
Triglycerides: 191 mg/dL — ABNORMAL HIGH (ref 34–149)
VLDL Calculated: 38 mg/dL (ref 10–40)

## 2022-10-06 ENCOUNTER — Encounter: Payer: Self-pay | Admitting: Internal Medicine

## 2022-10-06 ENCOUNTER — Telehealth (INDEPENDENT_AMBULATORY_CARE_PROVIDER_SITE_OTHER): Payer: Self-pay

## 2022-10-06 NOTE — Telephone Encounter (Signed)
Called pt re: lab results and recommendations from Dr Dimple Casey:     LDL is better but triglycerides remain elevated. He will need to continue to work on diet and exercise. Please make sure 6 month follow up is scheduled.     Discussed with pt. Pt v/u. Warm transferred pt to LO front desk to schedule 6 month follow up.

## 2022-10-20 ENCOUNTER — Ambulatory Visit (INDEPENDENT_AMBULATORY_CARE_PROVIDER_SITE_OTHER): Payer: No Typology Code available for payment source | Admitting: Sports Medicine

## 2022-10-20 ENCOUNTER — Other Ambulatory Visit (INDEPENDENT_AMBULATORY_CARE_PROVIDER_SITE_OTHER): Payer: Self-pay | Admitting: Cardiovascular Disease

## 2022-10-20 ENCOUNTER — Encounter (INDEPENDENT_AMBULATORY_CARE_PROVIDER_SITE_OTHER): Payer: Self-pay | Admitting: Sports Medicine

## 2022-10-20 VITALS — BP 148/96 | HR 72 | Ht 71.0 in | Wt 284.0 lb

## 2022-10-20 DIAGNOSIS — Z0000 Encounter for general adult medical examination without abnormal findings: Secondary | ICD-10-CM

## 2022-10-20 DIAGNOSIS — E782 Mixed hyperlipidemia: Secondary | ICD-10-CM

## 2022-10-20 DIAGNOSIS — Z9889 Other specified postprocedural states: Secondary | ICD-10-CM

## 2022-10-20 NOTE — Progress Notes (Signed)
[facility]    Provider: Valera Castle, MD  Date of Exam:  10/20/2022   Patient:  Guy Martinez  DOB:  February 04, 1971    AGE:  52 y.o.  MR#:  [id number]     Diagnosis: s/p  left shoulder arthroscopy w/RCR, ED, SAD, OBT (DOS: 07/27/22)     HPI:  Guy Martinez returns, 3 months status post left shoulder arthroscopy as described above. Guy Martinez has been compliant with all post-operative instructions to date. Guy Martinez has been attending formal physical therapy as previously prescribed. Guy Martinez is here today for his regularly scheduled post-operative visit.    For other past medical history, social history, family history and past surgical history please see them listed below.    Club/School/Work Affiliation: n/a    Problem List:   Patient Active Problem List   Diagnosis    HLD (hyperlipidemia)    Morbid obesity    HTN (hypertension)    Actinic keratosis    Sebaceous cyst    Overweight (BMI 25.0-29.9)    Alopecia    Disorder of hair follicle    Melanocytic nevi of trunk    Tinea pedis    Seborrheic keratosis    Vitamin D deficiency    OSA (obstructive sleep apnea)    Lung nodule    Encounter for screening and preventative care    Tear of right rotator cuff, unspecified tear extent, unspecified whether traumatic       Past Medical History:    Past Medical History:   Diagnosis Date    H/O Coronary CT Angiogram 10/2010    H/O echocardiogram 10/2014    H/O stress echocardiogram 02/2009    normal    H/O treadmill stress test 01/2008, 05/2010, 11/2015, 12/12/15    Hyperlipidemia     MANAGED WITH MEDS.    Hypertension     controlled w/med per pt    Sleep apnea     +OSA - USES CPAP NIGHTLY.       Social History:   Social History     Tobacco Use    Smoking status: Former     Current packs/day: 0.00     Average packs/day: 0.3 packs/day for 2.0 years (0.5 ttl pk-yrs)     Types: Cigarettes     Start date: 03/04/2009     Quit date: 03/05/2011     Years since quitting: 11.6    Smokeless tobacco: Former     Types: Snuff     Quit date: 05/31/2012    Tobacco  comments:     Social use. 5-7 cig/week   Vaping Use    Vaping status: Never Used   Substance Use Topics    Alcohol use: Not Currently     Alcohol/week: 7.0 standard drinks of alcohol     Types: 7 Drinks containing 0.5 oz of alcohol per week     Comment: < weekly basis    Drug use: Never       Family History:   Family History   Problem Relation Age of Onset    Diverticulitis Mother     Cancer Father 62        Prostate, Bladder    Heart attack Father 71        4 MI's    Stroke Father 55        2 strokes    Hypertension Father     Diabetes Father     Hyperlipidemia Father     Heart disease Father  Myocardial Infarction Father     No known problems Sister     No known problems Brother     No known problems Paternal Grandmother     COPD Paternal Grandfather     Heart attack Paternal Grandfather     Diabetes Paternal Grandfather     Heart disease Paternal Grandparent     Diabetes Paternal Grandparent     Emphysema Paternal Grandparent         pulmonary    No known problems Son     Appendicitis Son     Asthma Son     No known problems Son     Pancreatic cancer Paternal Uncle 71       Past Surgical History:    Past Surgical History:   Procedure Laterality Date    COLONOSCOPY, DIAGNOSTIC (SCREENING) N/A 12/03/2021    Repeat 5 years. Tubular Adenoma;  Surgeon: Denny Levy, MD;  Location: [facility];  Service: Gastroenterology;    WISDOM TOOTH EXTRACTION         Medications:  Scheduled Meds:  Continuous Infusions:  PRN Meds:.     Allergies:  No Known Allergies    ROS:    Constitutional: No fatigue, fever, weight loss, or weight gain.   Ears, Nose, Mouth & Throat: No sore throat or hearing loss.   Cardiovascular: No chest pain, blood clots, or leg cramps.   Respiratory: No shortness of breath, cough, or difficulty breathing.   Gastrointestinal: No nausea, vomiting, diarrhea, or loss of appetite.   Genitourinary: No polyuria or kidney disease.   Musculoskeletal: No joint aches, muscle weakness or swelling of joints/body  parts other than that mentioned above.  Integumentary: No finger nail changes or skin dryness.   Neurological: No numbness, burning discomfort, or headaches.   Psychiatric: No depression or anxiety.   Endocrine: No increased thirst, change in appetite or thyroid disease.   Hematologic/Lymphatic: No easy bruising or anemia.     Exam:   Left Shoulder Exam:  Incisions are well-healed, clean, dry and intact.  No effusion.  No swelling.  No tenderness to palpation.   ROM: Active forward flexion is to 140 active external rotation is to 75 active internal rotation is to approximately L3  Strength: 5/5 in all muscles tested.   No other areas of tenderness.  DTR and Pathological Reflexes Intact  No lymphadenopathy  Sensation Intact to light touch all distributions of arm and hand  Radial/Median/Ulnar Nerves intact to motor and sensory provocation  Hand Perfused with CR <2 sec  Radial pulse 2+    Vitals: BP (!) 148/96 Comment: pt denies symptoms, states Guy Martinez had coffee recently  Pulse 72   Ht 1.803 m (5\' 11" )   Wt 128.8 kg (284 lb)   BMI 39.61 kg/m     General: Guy Martinez was awake, alert, oriented, easily engaged, displayed logical thinking with clear speech and was neat in appearance. His general appearance was normal, well-developed and well-nourished.    Gait: The patient demonstrated a non-antalgic gait with intact coordination and balance.    Studies: No new studies obtained on today's visit.    Assessment/Plan:   Overall Guy Martinez is doing fairly well given the nature of his injury and being 3 months out.  The patient will continue physical therapy and therapeutic exercises per protocol. Patient was instructed on the dos and don'ts regarding protection of the surgical procedure, as well as appropriate activity levels going forward. Guy Martinez understands the importance of a dedicated therapy  program. We will plan on follow up in 8 weeks. The patient will notify my clinic of any changes or worsening of their symptoms during the  interim. All the patient's questions were answered appropriately and completely. Guy Martinez understands the plan.       The review of the patient's medications does not in any way constitute an endorsement, by this clinician,  of their use, dosage, indications, route, efficacy, interactions, or other clinical parameters.    This note may have been generated in part or in whole within the EPIC EMR using Dragon medical speech recognition software and may contain inherent errors or omissions not intended by the user. Grammatical and punctuation errors, random word insertions, deletions, pronoun errors and incomplete sentences are occasional consequences of this technology due to software limitations. Not all errors are caught or corrected.  Although every attempt is made to root out erroneus and incomplete transcription, the note may still not fully represent the intent or opinion of the author. If there are questions or concerns about the content of this note or information contained within the body of this dictation they should be addressed directly with the author for clarification.     Jackie Plum, ATC personally performed scribing services for Dr. Burnard Bunting for Lorenda Hatchet on Oct 20, 2022     [facility] ATC, acted as a Neurosurgeon for this encounter for Dr. Arsenio Loader. I have reviewed and edited this note when appropriate and agree with the documentation

## 2022-12-16 ENCOUNTER — Other Ambulatory Visit (INDEPENDENT_AMBULATORY_CARE_PROVIDER_SITE_OTHER): Payer: Self-pay | Admitting: Cardiovascular Disease

## 2022-12-16 MED ORDER — LOSARTAN POTASSIUM-HCTZ 100-25 MG PO TABS
1.0000 | ORAL_TABLET | Freq: Every day | ORAL | 3 refills | Status: DC
Start: 2022-12-16 — End: 2023-05-17

## 2022-12-17 ENCOUNTER — Ambulatory Visit (INDEPENDENT_AMBULATORY_CARE_PROVIDER_SITE_OTHER): Payer: No Typology Code available for payment source | Admitting: Sports Medicine

## 2022-12-28 DIAGNOSIS — Z538 Procedure and treatment not carried out for other reasons: Secondary | ICD-10-CM | POA: Insufficient documentation

## 2023-01-05 ENCOUNTER — Ambulatory Visit (INDEPENDENT_AMBULATORY_CARE_PROVIDER_SITE_OTHER): Payer: No Typology Code available for payment source | Admitting: Sports Medicine

## 2023-01-05 ENCOUNTER — Encounter: Payer: Self-pay | Admitting: Internal Medicine

## 2023-01-18 ENCOUNTER — Encounter (INDEPENDENT_AMBULATORY_CARE_PROVIDER_SITE_OTHER): Payer: Self-pay | Admitting: Sports Medicine

## 2023-01-25 ENCOUNTER — Other Ambulatory Visit (INDEPENDENT_AMBULATORY_CARE_PROVIDER_SITE_OTHER): Payer: Self-pay | Admitting: Cardiovascular Disease

## 2023-01-25 ENCOUNTER — Encounter (INDEPENDENT_AMBULATORY_CARE_PROVIDER_SITE_OTHER): Payer: Self-pay

## 2023-01-25 ENCOUNTER — Encounter (INDEPENDENT_AMBULATORY_CARE_PROVIDER_SITE_OTHER): Payer: Self-pay | Admitting: Cardiovascular Disease

## 2023-01-25 DIAGNOSIS — E782 Mixed hyperlipidemia: Secondary | ICD-10-CM

## 2023-01-25 DIAGNOSIS — Z0000 Encounter for general adult medical examination without abnormal findings: Secondary | ICD-10-CM

## 2023-01-26 ENCOUNTER — Ambulatory Visit (INDEPENDENT_AMBULATORY_CARE_PROVIDER_SITE_OTHER): Payer: No Typology Code available for payment source | Admitting: Sports Medicine

## 2023-02-07 ENCOUNTER — Other Ambulatory Visit: Payer: Self-pay | Admitting: Geriatric Medicine

## 2023-02-07 ENCOUNTER — Telehealth: Payer: Self-pay | Admitting: Internal Medicine

## 2023-02-07 ENCOUNTER — Ambulatory Visit (INDEPENDENT_AMBULATORY_CARE_PROVIDER_SITE_OTHER): Payer: No Typology Code available for payment source

## 2023-02-07 ENCOUNTER — Encounter (INDEPENDENT_AMBULATORY_CARE_PROVIDER_SITE_OTHER): Payer: Self-pay

## 2023-02-07 VITALS — BP 134/78 | HR 73 | Ht 71.0 in | Wt 276.0 lb

## 2023-02-07 DIAGNOSIS — E7800 Pure hypercholesterolemia, unspecified: Secondary | ICD-10-CM

## 2023-02-07 DIAGNOSIS — Z0000 Encounter for general adult medical examination without abnormal findings: Secondary | ICD-10-CM

## 2023-02-07 DIAGNOSIS — E559 Vitamin D deficiency, unspecified: Secondary | ICD-10-CM

## 2023-02-07 DIAGNOSIS — I2584 Coronary atherosclerosis due to calcified coronary lesion: Secondary | ICD-10-CM

## 2023-02-07 DIAGNOSIS — E663 Overweight: Secondary | ICD-10-CM

## 2023-02-07 DIAGNOSIS — Z6841 Body Mass Index (BMI) 40.0 and over, adult: Secondary | ICD-10-CM

## 2023-02-07 DIAGNOSIS — I2510 Atherosclerotic heart disease of native coronary artery without angina pectoris: Secondary | ICD-10-CM

## 2023-02-07 DIAGNOSIS — E782 Mixed hyperlipidemia: Secondary | ICD-10-CM

## 2023-02-07 DIAGNOSIS — I10 Essential (primary) hypertension: Secondary | ICD-10-CM

## 2023-02-07 NOTE — Telephone Encounter (Signed)
Please add lab order for Physical. Patient' appointment for blood work is on 9/17. Thanks.

## 2023-02-07 NOTE — Progress Notes (Signed)
[facility] OFFICE PROGRESS NOTE    [facility]  OFFICE -CARDIOLOGY  [address]  [place] [address]  Dept: [phone]  Dept Fax: [phone]       Patient Name: Guy Martinez, Guy Martinez    Date of Visit:  February 07, 2023  Date of Birth: [birth year]  AGE: 52 y.o.  Medical Record #: [id number]  Requesting Physician: Sarina Ill, MD      CHIEF COMPLAINT: Follow-up      HISTORY OF PRESENT ILLNESS:    He is a pleasant 52 y.o. male who presents today for routine follow-up.    LOV patient had Lipitor increased to 40.  LDL now at goal at 64 down from 100.  Reports he is tolerating regimen well.  He continues to be active playing golf.  Denies any exertional symptoms, chest discomfort, or shortness of breath.  Blood pressure under reasonable control.  He does share concern about the rash to left lateral malleolus which he plans to have evaluated further by PCP.  Denies any exertional calf, thigh, or buttocks symptoms.  Denies any poor healing wounds or abnormal hair growth patterns.  No new cardiac complaints/concerns at this time.    PAST MEDICAL HISTORY: He has a past medical history of H/O Coronary CT Angiogram (10/2010), H/O echocardiogram (10/2014), H/O stress echocardiogram (02/2009), H/O treadmill stress test (01/2008, 05/2010, 11/2015, 12/12/15), Hyperlipidemia, Hypertension, and Sleep apnea. He has a past surgical history that includes Wisdom tooth extraction and COLONOSCOPY, DIAGNOSTIC (SCREENING) (N/A, 12/03/2021).    Allergies[1]    MEDICATIONS:   Patient's current medications were reviewed. ONLY Cardiac medications were updated unless others were addressed in assessment and plan.    Current Outpatient Medications   Medication Instructions    atorvastatin (LIPITOR) 40 mg, Oral, Daily    losartan-hydrochlorothiazide (HYZAAR) 100-25 MG per tablet 1 tablet, Oral, Daily       FAMILY HISTORY: family history includes Appendicitis in his son; Asthma in his  son; COPD in his paternal grandfather; Cancer (age of onset: 72) in his father; Diabetes in his father, paternal grandfather, and paternal grandparent; Diverticulitis in his mother; Emphysema in his paternal grandparent; Heart attack in his paternal grandfather; Heart attack (age of onset: 69) in his father; Heart disease in his father and paternal grandparent; Hyperlipidemia in his father; Hypertension in his father; Myocardial Infarction in his father; No known problems in his brother, paternal grandmother, sister, son, and son; Pancreatic cancer (age of onset: 94) in his paternal uncle; Stroke (age of onset: 67) in his father.    SOCIAL HISTORY: He reports that he quit smoking about 11 years ago. His smoking use included cigarettes. He started smoking about 13 years ago. He has a 0.5 pack-year smoking history. He quit smokeless tobacco use about 10 years ago.  His smokeless tobacco use included snuff. He reports that he does not currently use alcohol after a past usage of about 7.0 standard drinks of alcohol per week. He reports that he does not use drugs.    PHYSICAL EXAMINATION    Visit Vitals  BP 134/78 (BP Site: Right arm, Patient Position: Sitting, Cuff Size: Large)   Pulse 73   Ht 1.803 m (5\' 11" )   Wt 125.2 kg (276 lb)   SpO2 95%   BMI 38.49 kg/m       Constitutional: Cooperative, alert, no acute distress.  Neck: No carotid bruits, JVP normal.  Cardiac: Regular rate and rhythm, normal S1 and S2;  no S3 or S4. No murmurs. No rubs, no gallops.  Pulmonary: Clear to auscultation bilaterally, no wheezing, no rhonchi, no rales.  Abdomen: soft, non-distended, non-TTP  Extremities: no edema.  Vascular: +2 pulses in radial artery bilaterally, 2+ pedal pulses bilaterally.    LABS REVIEWED:   Lab Results   Component Value Date    WBC 4.9 02/16/2022    HGB 13.8 02/16/2022    HCT 39.7 02/16/2022    PLT 188 02/16/2022     Lab Results   Component Value Date    GLU 102 (H) 10/01/2022    BUN 24.0 10/01/2022    CREAT 1.3  10/01/2022    NA 137 10/01/2022    K 3.9 10/01/2022    CL 103 10/01/2022    CO2 28 10/01/2022    AST 35 10/01/2022    ALT 54 10/01/2022     Lab Results   Component Value Date    MG 2.2 08/10/2017    TSH 2.84 02/16/2022    HGBA1C 5.0 02/16/2022    BNP 15.9 08/10/2017     Lab Results   Component Value Date    CHOL 128 10/01/2022    TRIG 191 (H) 10/01/2022    HDL 26 (L) 10/01/2022    LDL 64 10/01/2022       IMPRESSION:   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 CAC 126, 92nd percentile 12/2019.  Asymptomatic carotid atherosclerosis less than 50% bilateral in 03/2022  Hypertension-adequately controlled today.  Continue current pharmacotherapy  Hyperlipidemia-on atorvastatin 40 mg with lipids at goal  Family history of coronary disease-father had his first event in his 59s; Lp(a) 7 (2023)  Normal ETT in 2021.  Overweight with recent weight gain  Sleep apnea on CPAP      RECOMMENDATIONS:  Continue current cardiac medications.  TG above goal. Recommend lifestyle modifications to include diet and exercise: at least 150 min/wk of aerobic activity and Mediterranean diet (attached to AVS).  Carotid US due next year  Pt inquires about repeating CT CAC. Can reconsider this in 2 years time as last was 2021.  Pt prefers closer f/u. 6 month f/u with Dr. Dimple Casey.                                               Orders Placed This Encounter   Procedures    Office Visit ([facility] [place])     SIGNED:    Naman Spychalski Robyn Haber, PA          This note was generated by the Dragon speech recognition and may contain errors or omissions not intended by the user. Grammatical errors, random word insertions, deletions, pronoun errors, and incomplete sentences are occasional consequences of this technology due to software limitations. Not all errors are caught or corrected. If there are questions or concerns about the content of this note or information contained within the body of this dictation, they should be  addressed directly with the author for clarification.         [1] No Known Allergies

## 2023-02-11 ENCOUNTER — Ambulatory Visit (INDEPENDENT_AMBULATORY_CARE_PROVIDER_SITE_OTHER): Payer: No Typology Code available for payment source | Admitting: Sports Medicine

## 2023-02-11 DIAGNOSIS — Z9889 Other specified postprocedural states: Secondary | ICD-10-CM

## 2023-02-11 NOTE — Progress Notes (Signed)
[facility]    Provider: Valera Castle, MD  Date of Exam:  02/11/2023   Patient:  Guy Martinez  DOB:  01/05/1971    AGE:  52 y.o.  MR#:  [id number]     Diagnosis: s/p  left shoulder arthroscopy w/RCR, ED, SAD, OBT (DOS: 07/27/22)     HPI:  Gaddis returns, 6 months status post left shoulder arthroscopy as described above. He has been compliant with all post-operative instructions to date. He has been attending formal physical therapy as previously prescribed. Trason is here today for his regularly scheduled post-operative visit.  He has returned to golf he doing most things he wants to without any problems or discomfort.  He has no neck pain.    For other past medical history, social history, family history and past surgical history please see them listed below.    Club/School/Work Affiliation: n/a    Problem List:   Patient Active Problem List   Diagnosis    HLD (hyperlipidemia)    Morbid obesity    HTN (hypertension)    Actinic keratosis    Sebaceous cyst    Overweight (BMI 25.0-29.9)    Alopecia    Disorder of hair follicle    Melanocytic nevi of trunk    Tinea pedis    Seborrheic keratosis    Vitamin D deficiency    OSA (obstructive sleep apnea)    Lung nodule    Encounter for screening and preventative care    Tear of right rotator cuff, unspecified tear extent, unspecified whether traumatic       Past Medical History:    Past Medical History:   Diagnosis Date    H/O Coronary CT Angiogram 10/2010    H/O echocardiogram 10/2014    H/O stress echocardiogram 02/2009    normal    H/O treadmill stress test 01/2008, 05/2010, 11/2015, 12/12/15    Hyperlipidemia     MANAGED WITH MEDS.    Hypertension     controlled w/med per pt    Sleep apnea     +OSA - USES CPAP NIGHTLY.       Social History:   Social History     Tobacco Use    Smoking status: Former     Current packs/day: 0.00     Average packs/day: 0.3 packs/day for 2.0 years (0.5 ttl pk-yrs)     Types: Cigarettes     Start date: 03/04/2009     Quit date: 03/05/2011      Years since quitting: 11.9    Smokeless tobacco: Former     Types: Snuff     Quit date: 05/31/2012    Tobacco comments:     Social use. 5-7 cig/week   Vaping Use    Vaping status: Never Used   Substance Use Topics    Alcohol use: Not Currently     Alcohol/week: 7.0 standard drinks of alcohol     Types: 7 Drinks containing 0.5 oz of alcohol per week     Comment: < weekly basis    Drug use: Never       Family History:   Family History   Problem Relation Age of Onset    Diverticulitis Mother     Cancer Father 32        Prostate, Bladder    Heart attack Father 16        4 MI's    Stroke Father 43        2 strokes  Hypertension Father     Diabetes Father     Hyperlipidemia Father     Heart disease Father     Myocardial Infarction Father     No known problems Sister     No known problems Brother     No known problems Paternal Grandmother     COPD Paternal Grandfather     Heart attack Paternal Grandfather     Diabetes Paternal Grandfather     Heart disease Paternal Grandparent     Diabetes Paternal Grandparent     Emphysema Paternal Grandparent         pulmonary    No known problems Son     Appendicitis Son     Asthma Son     No known problems Son     Pancreatic cancer Paternal Uncle 52       Past Surgical History:    Past Surgical History:   Procedure Laterality Date    COLONOSCOPY, DIAGNOSTIC (SCREENING) N/A 12/03/2021    Repeat 5 years. Tubular Adenoma;  Surgeon: Denny Levy, MD;  Location: [facility];  Service: Gastroenterology;    WISDOM TOOTH EXTRACTION         Medications:  Scheduled Meds:  Continuous Infusions:  PRN Meds:.     Allergies:  No Known Allergies    ROS:    Constitutional: No fatigue, fever, weight loss, or weight gain.   Ears, Nose, Mouth & Throat: No sore throat or hearing loss.   Cardiovascular: No chest pain, blood clots, or leg cramps.   Respiratory: No shortness of breath, cough, or difficulty breathing.   Gastrointestinal: No nausea, vomiting, diarrhea, or loss of appetite.    Genitourinary: No polyuria or kidney disease.   Musculoskeletal: No joint aches, muscle weakness or swelling of joints/body parts other than that mentioned above.  Integumentary: No finger nail changes or skin dryness.   Neurological: No numbness, burning discomfort, or headaches.   Psychiatric: No depression or anxiety.   Endocrine: No increased thirst, change in appetite or thyroid disease.   Hematologic/Lymphatic: No easy bruising or anemia.     Exam:   Left Shoulder Exam:  Incisions are well-healed, clean, dry and intact.  No effusion.  No swelling.  No tenderness to palpation.   ROM: Active forward flexion is to 180 active external rotation is to 90 active internal rotation is to approximately L1  Strength: 5/5 in all muscles tested.   No other areas of tenderness.  DTR and Pathological Reflexes Intact  No lymphadenopathy  Sensation Intact to light touch all distributions of arm and hand  Radial/Median/Ulnar Nerves intact to motor and sensory provocation  Hand Perfused with CR <2 sec  Radial pulse 2+    Vitals: There were no vitals taken for this visit.    General: Harrey was awake, alert, oriented, easily engaged, displayed logical thinking with clear speech and was neat in appearance. His general appearance was normal, well-developed and well-nourished.    Gait: The patient demonstrated a non-antalgic gait with intact coordination and balance.    Studies: No new studies obtained on today's visit.    Assessment/Plan:   Overall he is doing very well given the nature of his injury and being 6 months out.  The patient will continue physical therapy and therapeutic exercises per protocol. Patient was instructed on the dos and don'ts regarding protection of the surgical procedure, as well as appropriate activity levels going forward. [place] understands the importance of a dedicated therapy program. We  will plan on follow up in 8 weeks or as needed at this time.. The patient will notify my clinic of any changes or  worsening of their symptoms during the interim. All the patient's questions were answered appropriately and completely. [place] understands the plan.     Based on the below 2023 Evaluation and Management Time Requirements, the appropriate level of service is 99213 based on the 21 minutes spent face-to-face with the patient along with the non-face-to-face time spent by me performing the following highlighted activities:      Preparing for the visit (such as reviewing tests)  Getting and/or reviewing a history that was separately obtained  Performing the exam  Counseling and providing education to the patient, family, or caregiver  Documenting information in the medical record  Interpreting results and sharing that information with the patient, family or caregiver  Ordering medicines, tests, or procedures  Communicating with other healthcare professionals  Care coordination           The review of the patient's medications does not in any way constitute an endorsement, by this clinician,  of their use, dosage, indications, route, efficacy, interactions, or other clinical parameters.    This note may have been generated in part or in whole within the [employer] EMR using Dragon medical speech recognition software and may contain inherent errors or omissions not intended by the user. Grammatical and punctuation errors, random word insertions, deletions, pronoun errors and incomplete sentences are occasional consequences of this technology due to software limitations. Not all errors are caught or corrected.  Although every attempt is made to root out erroneus and incomplete transcription, the note may still not fully represent the intent or opinion of the author. If there are questions or concerns about the content of this note or information contained within the body of this dictation they should be addressed directly with the author for clarification.     I, Wynelle Beckmann, PA-C personally provided scribing services for Burnard Bunting, MD for patient Finneus Rasor on February 11, 2023 .     Melody Burnetta Sabin, [place], acted as a Neurosurgeon for this encounter for Dr. Arsenio Loader. I have reviewed and edited this note when appropriate and agree with the documentation

## 2023-02-15 ENCOUNTER — Other Ambulatory Visit: Payer: No Typology Code available for payment source

## 2023-02-16 ENCOUNTER — Other Ambulatory Visit (FREE_STANDING_LABORATORY_FACILITY): Payer: No Typology Code available for payment source

## 2023-02-16 DIAGNOSIS — E559 Vitamin D deficiency, unspecified: Secondary | ICD-10-CM

## 2023-02-16 DIAGNOSIS — I10 Essential (primary) hypertension: Secondary | ICD-10-CM

## 2023-02-16 DIAGNOSIS — Z0000 Encounter for general adult medical examination without abnormal findings: Secondary | ICD-10-CM

## 2023-02-16 DIAGNOSIS — Z6841 Body Mass Index (BMI) 40.0 and over, adult: Secondary | ICD-10-CM

## 2023-02-16 DIAGNOSIS — E7800 Pure hypercholesterolemia, unspecified: Secondary | ICD-10-CM

## 2023-02-16 LAB — COMPREHENSIVE METABOLIC PANEL
ALT: 59 U/L — ABNORMAL HIGH (ref 0–55)
AST (SGOT): 36 U/L (ref 5–41)
Albumin/Globulin Ratio: 1.9 (ref 0.9–2.2)
Albumin: 4.3 g/dL (ref 3.5–5.0)
Alkaline Phosphatase: 72 U/L (ref 37–117)
Anion Gap: 8 (ref 5.0–15.0)
BUN: 18 mg/dL (ref 9–28)
Bilirubin, Total: 0.7 mg/dL (ref 0.2–1.2)
CO2: 25 meq/L (ref 17–29)
Calcium: 8.8 mg/dL (ref 8.5–10.5)
Chloride: 105 meq/L (ref 99–111)
Creatinine: 1.3 mg/dL (ref 0.5–1.5)
GFR: >60 mL/min/{1.73_m2} (ref >=60.0)
Globulin: 2.3 g/dL (ref 2.0–3.6)
Glucose: 107 mg/dL — ABNORMAL HIGH (ref 70–100)
Hemolysis Index: 8 {index}
Potassium: 4.2 meq/L (ref 3.5–5.3)
Protein, Total: 6.6 g/dL (ref 6.0–8.3)
Sodium: 138 meq/L (ref 135–145)

## 2023-02-16 LAB — LAB USE ONLY - CBC WITH DIFFERENTIAL
Absolute Basophils: 0.08 10*3/uL (ref 0.00–0.08)
Absolute Eosinophils: 0.13 10*3/uL (ref 0.00–0.44)
Absolute Immature Granulocytes: 0.03 10*3/uL (ref 0.00–0.07)
Absolute Lymphocytes: 2.54 10*3/uL (ref 0.42–3.22)
Absolute Monocytes: 0.52 10*3/uL (ref 0.21–0.85)
Absolute Neutrophils: 3.75 10*3/uL (ref 1.10–6.33)
Absolute nRBC: 0 10*3/uL (ref <=0.00)
Basophils %: 1.1 %
Eosinophils %: 1.8 %
Hematocrit: 41.8 % (ref 37.6–49.6)
Hemoglobin: 14.1 g/dL (ref 12.5–17.1)
Immature Granulocytes %: 0.4 %
Lymphocytes %: 36 %
MCH: 30 pg (ref 25.1–33.5)
MCHC: 33.7 g/dL (ref 31.5–35.8)
MCV: 88.9 fL (ref 78.0–96.0)
MPV: 10 fL (ref 8.9–12.5)
Monocytes %: 7.4 %
Neutrophils %: 53.3 %
Platelet Count: 239 10*3/uL (ref 142–346)
Preliminary Absolute Neutrophil Count: 3.75 10*3/uL (ref 1.10–6.33)
RBC: 4.7 10*6/uL (ref 4.20–5.90)
RDW: 13 % (ref 11–15)
WBC: 7.05 10*3/uL (ref 3.10–9.50)
nRBC %: 0 /100{WBCs} (ref <=0.0)

## 2023-02-16 LAB — LAB USE ONLY - URINALYSIS WITH REFLEX TO MICROSCOPIC EXAM AND CULTURE
Urine Bilirubin: NEGATIVE
Urine Glucose: NEGATIVE
Urine Ketones: NEGATIVE mg/dL
Urine Leukocyte Esterase: NEGATIVE
Urine Nitrite: NEGATIVE
Urine Specific Gravity: 1.022 (ref 1.001–1.035)
Urine Urobilinogen: NORMAL mg/dL (ref 0.2–2.0)
Urine pH: 5.5 (ref 5.0–8.0)

## 2023-02-16 LAB — VITAMIN D, 25 OH, TOTAL: Vitamin D 25-OH, Total: 30 ng/mL (ref 30–100)

## 2023-02-16 LAB — PSA TOTAL: Prostate Specific Antigen, Total: 0.76 ng/mL (ref 0.000–4.000)

## 2023-02-16 LAB — IRON PROFILE
Iron Saturation: 21 % (ref 15–50)
Iron: 67 ug/dL (ref 41–168)
TIBC: 318 ug/dL (ref 261–462)
UIBC: 251 ug/dL (ref 126–382)

## 2023-02-16 LAB — LIPID PANEL
Cholesterol / HDL Ratio: 3.8 {index}
Cholesterol: 133 mg/dL (ref <=199)
HDL: 35 mg/dL — ABNORMAL LOW (ref >=40)
LDL Calculated: 67 mg/dL (ref 0–99)
Triglycerides: 157 mg/dL — ABNORMAL HIGH (ref 34–149)
VLDL Calculated: 31 mg/dL (ref 10–40)

## 2023-02-16 LAB — HEMOGLOBIN A1C
Average Estimated Glucose: 99.7 mg/dL
Hemoglobin A1C: 5.1 % (ref 4.6–5.6)

## 2023-02-16 LAB — URIC ACID: Uric Acid: 7 mg/dL (ref 3.6–9.7)

## 2023-02-16 LAB — TSH: TSH: 3.39 u[IU]/mL (ref 0.35–4.94)

## 2023-02-25 ENCOUNTER — Other Ambulatory Visit: Payer: Self-pay | Admitting: Internal Medicine

## 2023-02-25 ENCOUNTER — Ambulatory Visit (FREE_STANDING_LABORATORY_FACILITY): Payer: No Typology Code available for payment source | Admitting: Internal Medicine

## 2023-02-25 ENCOUNTER — Encounter: Payer: Self-pay | Admitting: Internal Medicine

## 2023-02-25 VITALS — BP 145/82 | HR 72 | Temp 98.6°F | Resp 16 | Ht 70.7 in | Wt 281.2 lb

## 2023-02-25 DIAGNOSIS — E669 Obesity, unspecified: Secondary | ICD-10-CM

## 2023-02-25 DIAGNOSIS — M75101 Unspecified rotator cuff tear or rupture of right shoulder, not specified as traumatic: Secondary | ICD-10-CM

## 2023-02-25 DIAGNOSIS — G4733 Obstructive sleep apnea (adult) (pediatric): Secondary | ICD-10-CM

## 2023-02-25 DIAGNOSIS — Z0000 Encounter for general adult medical examination without abnormal findings: Secondary | ICD-10-CM

## 2023-02-25 DIAGNOSIS — R911 Solitary pulmonary nodule: Secondary | ICD-10-CM

## 2023-02-25 DIAGNOSIS — I10 Essential (primary) hypertension: Secondary | ICD-10-CM

## 2023-02-25 DIAGNOSIS — E559 Vitamin D deficiency, unspecified: Secondary | ICD-10-CM

## 2023-02-25 DIAGNOSIS — E7800 Pure hypercholesterolemia, unspecified: Secondary | ICD-10-CM

## 2023-02-25 LAB — URINALYSIS WITH REFLEX TO MICROSCOPIC EXAM - REFLEX TO CULTURE
Urine Bilirubin: NEGATIVE
Urine Blood: NEGATIVE
Urine Glucose: NEGATIVE
Urine Ketones: NEGATIVE mg/dL
Urine Leukocyte Esterase: NEGATIVE
Urine Nitrite: NEGATIVE
Urine Protein: NEGATIVE
Urine Specific Gravity: 1.005 (ref 1.001–1.035)
Urine Urobilinogen: NORMAL mg/dL (ref 0.2–2.0)
Urine pH: 6.5 (ref 5.0–8.0)

## 2023-02-25 LAB — ECG 12-LEAD
Atrial Rate: 69 {beats}/min
IHS MUSE NARRATIVE AND IMPRESSION: NORMAL
P Axis: 43 degrees
P-R Interval: 202 ms
Q-T Interval: 400 ms
QRS Duration: 96 ms
QTC Calculation (Bezet): 428 ms
R Axis: 41 degrees
T Axis: 46 degrees
Ventricular Rate: 69 {beats}/min

## 2023-02-25 LAB — LAB USE ONLY - URINE GRAY CULTURE HOLD TUBE

## 2023-02-25 MED ORDER — SEMAGLUTIDE-WEIGHT MANAGEMENT 0.25 MG/0.5ML SC SOAJ
0.2500 mg | SUBCUTANEOUS | 0 refills | Status: DC
Start: 2023-02-25 — End: 2023-06-10

## 2023-02-25 NOTE — Progress Notes (Signed)
[  x]Hearing Test  There were no hearing deficits detected in bilateral ears. All frequencies tested were within normal limits. Hearing protection was discussed.  The use of ear plugs or noise cancelling headphones were encouraged when exposed to loud noise or when traveling on an airplane.     [x]  Vision Test  Performed. Patient does not see any eye doctor. Vision was tested as 20/20 far and 20/85 near.Depth perception 6 of 6 fields passed. All other vision tests passed within normal limits.    []   Not available at this time.    [x]  InBody  Test performed and results discussed with physician.    [x]  EKG  Test performed and results discussed with physician.         Blood Draw:  1 yellow-red top tube - UA.  1 gray top tube - cx  Processed: none.  Lab/Temperature: Labs sent to [facility] refrigerated.     Updated patient's chart   Allergies  Medications  History  Depression Screening  Health Maintenance  Immunizations  Vital signs  Pharmacy  Advance Directive. Patient has Advance Directives, will forward documents to provider.

## 2023-02-25 NOTE — Progress Notes (Signed)
Guy Martinez is a 52 y.o. male presents today for    Chief Complaint   Patient presents with    Annual Exam     Cards/Sleep: [facility]  HTN - Recently recommended to change from lisinopril to losartan-hctz which pt has not done so yet.  HLD - Statin 20 mg  Family history of early cardiac disease - Evaluated at [facility] for non-obstructive disease.   OSA - on CPAP     FEN:  Low Vitamin D -has not been regularly taking his vitamin D supplementation     Ortho: Dr. Arsenio Loader  Left Shoulder Repair - recent visit with ortho indicates he will need surgery for a repair.     Derm:  Numerous moles - annual follow up     Health Maintenance  Colonoscopy: 2023 with Dr. Malva Limes. Repeat due 2028.  Testicular Exams: Done regularly  Derm: Annual follow up  Dental: Annually     Surgical History: Reviewed and noted below  Family History: Reviewed and noted below. Father with history of early heart attack.  Social Hisory: Reviewed and noted below     Family: Lives with wife and four kids. 20, 18, 17, 75 yo. Rottie mix and another rescue.  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   Stress: Low. Good oulets and support network among colleagues and friends.  Depression Screening: Negative  Hobbies: Golf in the 83s. Kids.  Exercise: No formal exercise. Recently got the hydrorow and mirror.  Diet: Could use work. Eating out more.  Sleep: 7 hrs/night. No difficulty falling or staying asleep. Using cpap. Well rested in the AM and no daytime somnolence.    HISTORY     Problem List[1]    Medical History[2]    Medications Ordered Prior to Encounter[3]    Allergies[4]    Past Surgical History[5]    Family History[6]    Social History[7]      REVIEW OF SYSTEMS     Review of Systems   Constitutional:  Negative for chills, fever, malaise/fatigue and weight loss.   HENT:  Negative for congestion, ear discharge, ear pain, hearing loss, sinus pain and sore throat.    Eyes:  Negative for blurred vision, double  vision, pain and discharge.   Respiratory:  Negative for cough, shortness of breath and wheezing.    Cardiovascular:  Negative for chest pain, palpitations, leg swelling and PND.   Gastrointestinal:  Negative for abdominal pain, blood in stool, constipation, diarrhea, nausea and vomiting.   Genitourinary:  Negative for dysuria, frequency and hematuria.   Musculoskeletal:  Negative for back pain, joint pain, myalgias and neck pain.   Skin:  Negative for rash.   Neurological:  Negative for dizziness, focal weakness, loss of consciousness, weakness and headaches.   Psychiatric/Behavioral:  The patient does not have insomnia.          PHYSICAL EXAM     Vital Signs (most recent): BP 145/82 (BP Site: Right arm, Patient Position: Sitting, Cuff Size: Large)   Pulse 72   Temp 98.6 F (37 C) (Oral)   Resp 16   Ht 1.796 m (5' 10.7")   Wt 127.6 kg (281 lb 3.2 oz) Comment: with clothes  SpO2 98%   BMI 39.55 kg/m     Physical Exam  Vitals reviewed.   Constitutional:       General: He is not in acute distress.     Appearance: Normal appearance. He is normal  weight. He is not ill-appearing, toxic-appearing or diaphoretic.   HENT:      Head: Normocephalic and atraumatic.      Right Ear: Tympanic membrane, ear canal and external ear normal. There is no impacted cerumen.      Left Ear: Tympanic membrane, ear canal and external ear normal. There is no impacted cerumen.      Nose: Nose normal. No congestion or rhinorrhea.      Mouth/Throat:      Mouth: Mucous membranes are moist.      Pharynx: Oropharynx is clear. No oropharyngeal exudate or posterior oropharyngeal erythema.   Eyes:      General: No scleral icterus.        Right eye: No discharge.         Left eye: No discharge.      Extraocular Movements: Extraocular movements intact.      Conjunctiva/sclera: Conjunctivae normal.      Pupils: Pupils are equal, round, and reactive to light.   Neck:      Vascular: No carotid bruit.   Cardiovascular:      Rate and Rhythm: Normal  rate and regular rhythm.      Pulses: Normal pulses.      Heart sounds: Normal heart sounds. No murmur heard.     No friction rub. No gallop.   Pulmonary:      Effort: Pulmonary effort is normal. No respiratory distress.      Breath sounds: Normal breath sounds. No stridor. No wheezing, rhonchi or rales.   Chest:      Chest wall: No tenderness.   Abdominal:      General: Abdomen is flat. Bowel sounds are normal. There is no distension.      Palpations: Abdomen is soft. There is no mass.      Tenderness: There is no abdominal tenderness. There is no right CVA tenderness, left CVA tenderness, guarding or rebound.      Hernia: No hernia is present.   Genitourinary:     Penis: Normal.       Testes: Normal.   Musculoskeletal:         General: No swelling, tenderness, deformity or signs of injury. Normal range of motion.      Cervical back: Normal range of motion and neck supple. No rigidity or tenderness.      Right lower leg: No edema.      Left lower leg: No edema.   Lymphadenopathy:      Cervical: No cervical adenopathy.   Skin:     General: Skin is warm and dry.      Capillary Refill: Capillary refill takes less than 2 seconds.      Coloration: Skin is not jaundiced or pale.      Findings: No bruising, erythema, lesion or rash.   Neurological:      General: No focal deficit present.      Mental Status: He is alert and oriented to person, place, and time. Mental status is at baseline.      Cranial Nerves: No cranial nerve deficit.      Sensory: No sensory deficit.      Motor: No weakness.      Coordination: Coordination normal.      Gait: Gait normal.      Deep Tendon Reflexes: Reflexes normal.   Psychiatric:         Mood and Affect: Mood normal.         Behavior: Behavior normal.  Thought Content: Thought content normal.         Judgment: Judgment normal.          ASSESSMENT & PLAN     1. Annual physical exam  ECG 12 lead    Urinalysis with Reflex to Microscopic Exam and Culture    Urine Wallace Cullens Culture Hold Tube     Urinalysis with Reflex to Microscopic Exam and Culture    Urine Wallace Cullens Culture Hold Tube      2. Obesity (BMI 30-39.9)  semaglutide (WEGOVY) 0.25 MG/0.5ML injection      3. Morbid obesity        4. Pure hypercholesterolemia        5. Primary hypertension        6. Lung nodule        7. OSA (obstructive sleep apnea)        8. Tear of right rotator cuff, unspecified tear extent, unspecified whether traumatic        9. Vitamin D deficiency          Cards/Sleep: [facility]  HTN - Recently recommended to change from lisinopril to losartan-hctz which pt has not done so yet.  HLD - Statin 20 mg  Family history of early cardiac disease - Evaluated at [facility] for non-obstructive disease.   OSA - on CPAP     FEN:  Low Vitamin D -has not been regularly taking his vitamin D supplementation  Obesity - discussed starting a GLP-1     Ortho: Dr. Arsenio Loader  Left Shoulder Repair - recent visit with ortho indicates he will need surgery for a repair.     Derm:  Numerous moles - annual follow up    Health Maintenance  - COVID due fall 2024  - Flu shot due fall 2024  - Shingles over due  - Pneumonia Vaccine: at 52 yo  - Tdap: up to date  - Hep C: negative  - Colonoscopy Repeat due 2028  - Dexa at 52 yo  - LDCT: < 20 pack year history  - AAA: [place] at 52 yo  - PSA normal  - Derm annual visits    The 10-year ASCVD risk score (Arnett DK, et al., 2019) is: 5.7%    Values used to calculate the score:      Age: 79 years      Sex: Male      Is Non-Hispanic African American: No      Diabetic: No      Tobacco smoker: No      Systolic Blood Pressure: 145 mmHg      Is BP treated: Yes      HDL Cholesterol: 35 mg/dL      Total Cholesterol: 133 mg/dL    EKG: Normal Sinus Rhythm. Normal axis. Normal intervals. No ST elevations, ST depression, q waves, or inverted T waves appreciated.    Office follow up in 12 months for repeat physical or sooner as needed      Signed,  Sarina Ill, MD 03/18/2023 4:39 PM       [1]   Patient Active Problem List  Diagnosis     HLD (hyperlipidemia)    Morbid obesity    HTN (hypertension)    Actinic keratosis    Sebaceous cyst    Alopecia    Disorder of hair follicle    Melanocytic nevi of trunk    Tinea pedis    Seborrheic keratosis    Vitamin D deficiency  OSA (obstructive sleep apnea)    Lung nodule    Tear of right rotator cuff, unspecified tear extent, unspecified whether traumatic   [2]   Past Medical History:  Diagnosis Date    H/O Coronary CT Angiogram 10/2010    H/O echocardiogram 10/2014    H/O stress echocardiogram 02/2009    normal    H/O treadmill stress test 01/2008, 05/2010, 11/2015, 12/12/15    Hyperlipidemia     MANAGED WITH MEDS.    Hypertension     controlled w/med per pt    Sleep apnea     +OSA - USES CPAP NIGHTLY.   [3]   Current Outpatient Medications on File Prior to Visit   Medication Sig Dispense Refill    atorvastatin (LIPITOR) 40 MG tablet Take 1 tablet (40 mg) by mouth daily 90 tablet 3    losartan-hydrochlorothiazide (HYZAAR) 100-25 MG per tablet Take 1 tablet by mouth daily 90 tablet 3     No current facility-administered medications on file prior to visit.   [4] No Known Allergies  [5]   Past Surgical History:  Procedure Laterality Date    COLONOSCOPY, DIAGNOSTIC (SCREENING) N/A 12/03/2021    Repeat 5 years. Tubular Adenoma;  Surgeon: Denny Levy, MD;  Location: [facility];  Service: Gastroenterology;    rotator cuff surgery Left 07/01/2022    WISDOM TOOTH EXTRACTION     [6]   Family History  Problem Relation Name Age of Onset    Diverticulitis Mother      Cancer Father Rodnie Middendorf 70        Prostate, Bladder    Heart attack Father Shihab Baechle 45        4 MI's    Stroke Father Vickey Tolhurst 55        2 strokes    Hypertension Father Jameis Koltes     Diabetes Father Leonhard Dowie     Hyperlipidemia Father Saafir Wiesemann     Heart disease Father Maxey Colberg     Myocardial Infarction Father Karthik Coffer     No known problems Sister      No known problems Brother      No known problems Paternal Grandmother      COPD Paternal  Grandfather Cregg Tagliaferri     Heart attack Paternal Grandfather Nyquan Pursifull     Diabetes Paternal Grandfather Braxdyn Pera     Heart disease Paternal Grandparent      Diabetes Paternal Grandparent      Emphysema Paternal Grandparent          pulmonary    No known problems Son Fredricka Bonine     Appendicitis Son Valentina Lucks     Asthma Son Colleen Can     No known problems Son Leighton Parody     Pancreatic cancer Paternal Uncle  68   [7]   Social History  Tobacco Use    Smoking status: Former     Current packs/day: 0.00     Average packs/day: 0.3 packs/day for 2.0 years (0.5 ttl pk-yrs)     Types: Cigarettes     Start date: 03/04/2009     Quit date: 03/05/2011     Years since quitting: 12.0    Smokeless tobacco: Former     Types: Snuff     Quit date: 05/31/2012    Tobacco comments:     Social use. 5-7 cig/week   Vaping Use    Vaping status: Never Used   Substance Use Topics  Alcohol use: Yes     Alcohol/week: 7.0 standard drinks of alcohol     Types: 7 Drinks containing 0.5 oz of alcohol per week     Comment: < weekly basis    Drug use: Never

## 2023-03-01 ENCOUNTER — Encounter: Payer: Self-pay | Admitting: Internal Medicine

## 2023-03-03 ENCOUNTER — Telehealth: Payer: Self-pay | Admitting: Internal Medicine

## 2023-03-03 NOTE — Telephone Encounter (Signed)
[name] called from [facility]   Requesting Insurance Pre Authorization for patient for the following prescription    :

## 2023-03-18 ENCOUNTER — Encounter: Payer: Self-pay | Admitting: Internal Medicine

## 2023-03-29 NOTE — Progress Notes (Signed)
 SLEEP MEDICINE OFFICE PROGRESS NOTE    [facility] [place]   [facility] -CARDIOLOGY  [address]  [place] [address]  Dept: [phone]  Dept Fax: [phone]       Patient Name: Guy Martinez,Guy Martinez    Date of Visit:  Oc

## 2023-03-31 ENCOUNTER — Ambulatory Visit (INDEPENDENT_AMBULATORY_CARE_PROVIDER_SITE_OTHER): Payer: No Typology Code available for payment source | Admitting: Physician Assistant

## 2023-03-31 ENCOUNTER — Encounter (INDEPENDENT_AMBULATORY_CARE_PROVIDER_SITE_OTHER): Payer: Self-pay | Admitting: Physician Assistant

## 2023-03-31 VITALS — BP 142/82 | HR 80 | Ht 71.0 in | Wt 282.0 lb

## 2023-03-31 DIAGNOSIS — G4733 Obstructive sleep apnea (adult) (pediatric): Secondary | ICD-10-CM

## 2023-03-31 DIAGNOSIS — Z6839 Body mass index (BMI) 39.0-39.9, adult: Secondary | ICD-10-CM

## 2023-04-05 ENCOUNTER — Ambulatory Visit (INDEPENDENT_AMBULATORY_CARE_PROVIDER_SITE_OTHER): Payer: No Typology Code available for payment source

## 2023-04-05 DIAGNOSIS — G4733 Obstructive sleep apnea (adult) (pediatric): Secondary | ICD-10-CM

## 2023-04-05 NOTE — Progress Notes (Signed)
 SLEEP CENTER    [facility] [place] [facility] [place] [facility]  [address]  [place] [address]  Dept: [phone]  Dept Fax: [phone]       Durable Medical Equipment (DME) Visit  Date of Visit: November

## 2023-05-04 ENCOUNTER — Other Ambulatory Visit (INDEPENDENT_AMBULATORY_CARE_PROVIDER_SITE_OTHER): Payer: Self-pay | Admitting: Cardiovascular Disease

## 2023-05-04 DIAGNOSIS — E782 Mixed hyperlipidemia: Secondary | ICD-10-CM

## 2023-05-04 DIAGNOSIS — Z0000 Encounter for general adult medical examination without abnormal findings: Secondary | ICD-10-CM

## 2023-05-04 MED ORDER — ATORVASTATIN CALCIUM 40 MG PO TABS
40.0000 mg | ORAL_TABLET | Freq: Every day | ORAL | 3 refills | Status: DC
Start: 2023-05-04 — End: 2023-05-17

## 2023-05-17 ENCOUNTER — Encounter: Payer: Self-pay | Admitting: Internal Medicine

## 2023-05-17 DIAGNOSIS — I10 Essential (primary) hypertension: Secondary | ICD-10-CM

## 2023-05-17 DIAGNOSIS — E782 Mixed hyperlipidemia: Secondary | ICD-10-CM

## 2023-05-17 DIAGNOSIS — Z0000 Encounter for general adult medical examination without abnormal findings: Secondary | ICD-10-CM

## 2023-05-17 MED ORDER — ATORVASTATIN CALCIUM 40 MG PO TABS
40.0000 mg | ORAL_TABLET | Freq: Every day | ORAL | 0 refills | Status: DC
Start: 2023-05-17 — End: 2023-09-19

## 2023-05-17 MED ORDER — LOSARTAN POTASSIUM-HCTZ 100-25 MG PO TABS
1.0000 | ORAL_TABLET | Freq: Every day | ORAL | 0 refills | Status: DC
Start: 2023-05-17 — End: 2023-12-19

## 2023-05-17 NOTE — Progress Notes (Signed)
Called in 7 tablets of her Lipitor and Hyzaar after patient left the prescription at home.  [facility] [place].

## 2023-05-27 ENCOUNTER — Encounter: Payer: Self-pay | Admitting: Internal Medicine

## 2023-05-30 NOTE — Progress Notes (Signed)
 Addressed via telephone encounter in patient's chart.

## 2023-06-08 ENCOUNTER — Encounter: Payer: Self-pay | Admitting: Internal Medicine

## 2023-06-08 DIAGNOSIS — K760 Fatty (change of) liver, not elsewhere classified: Secondary | ICD-10-CM

## 2023-06-08 DIAGNOSIS — G4733 Obstructive sleep apnea (adult) (pediatric): Secondary | ICD-10-CM

## 2023-06-08 DIAGNOSIS — E7800 Pure hypercholesterolemia, unspecified: Secondary | ICD-10-CM

## 2023-06-08 DIAGNOSIS — E669 Obesity, unspecified: Secondary | ICD-10-CM

## 2023-06-08 DIAGNOSIS — I10 Essential (primary) hypertension: Secondary | ICD-10-CM

## 2023-06-10 ENCOUNTER — Other Ambulatory Visit: Payer: Self-pay

## 2023-06-10 ENCOUNTER — Ambulatory Visit: Payer: No Typology Code available for payment source | Attending: Internal Medicine

## 2023-06-10 DIAGNOSIS — Z0000 Encounter for general adult medical examination without abnormal findings: Secondary | ICD-10-CM | POA: Insufficient documentation

## 2023-06-10 DIAGNOSIS — I10 Essential (primary) hypertension: Secondary | ICD-10-CM

## 2023-06-10 LAB — LAB USE ONLY - CBC WITH DIFFERENTIAL
Absolute Basophils: 0.06 10*3/uL (ref 0.00–0.08)
Absolute Eosinophils: 0.11 10*3/uL (ref 0.00–0.44)
Absolute Immature Granulocytes: 0.02 10*3/uL (ref 0.00–0.07)
Absolute Lymphocytes: 2.09 10*3/uL (ref 0.42–3.22)
Absolute Monocytes: 0.5 10*3/uL (ref 0.21–0.85)
Absolute Neutrophils: 3.36 10*3/uL (ref 1.10–6.33)
Absolute nRBC: 0 10*3/uL (ref <=0.00)
Basophils %: 1 %
Eosinophils %: 1.8 %
Hematocrit: 39.3 % (ref 37.6–49.6)
Hemoglobin: 14 g/dL (ref 12.5–17.1)
Immature Granulocytes %: 0.3 %
Lymphocytes %: 34 %
MCH: 30.4 pg (ref 25.1–33.5)
MCHC: 35.6 g/dL (ref 31.5–35.8)
MCV: 85.4 fL (ref 78.0–96.0)
MPV: 9 fL (ref 8.9–12.5)
Monocytes %: 8.1 %
Neutrophils %: 54.8 %
Platelet Count: 214 10*3/uL (ref 142–346)
Preliminary Absolute Neutrophil Count: 3.36 10*3/uL (ref 1.10–6.33)
RBC: 4.6 10*6/uL (ref 4.20–5.90)
RDW: 13 % (ref 11–15)
WBC: 6.14 10*3/uL (ref 3.10–9.50)
nRBC %: 0 /100{WBCs} (ref <=0.0)

## 2023-06-10 LAB — COMPREHENSIVE METABOLIC PANEL
ALT: 85 U/L — ABNORMAL HIGH (ref <=55)
AST (SGOT): 53 U/L — ABNORMAL HIGH (ref <=41)
Albumin/Globulin Ratio: 1.7 (ref 0.9–2.2)
Albumin: 4.3 g/dL (ref 3.5–5.0)
Alkaline Phosphatase: 66 U/L (ref 37–117)
Anion Gap: 8 (ref 5.0–15.0)
BUN: 21 mg/dL (ref 9–28)
Bilirubin, Total: 1 mg/dL (ref 0.2–1.2)
CO2: 27 meq/L (ref 17–29)
Calcium: 9.2 mg/dL (ref 8.5–10.5)
Chloride: 105 meq/L (ref 99–111)
Creatinine: 1.2 mg/dL (ref 0.5–1.5)
GFR: >60 mL/min/{1.73_m2} (ref >=60.0)
Globulin: 2.5 g/dL (ref 2.0–3.6)
Glucose: 106 mg/dL — ABNORMAL HIGH (ref 70–100)
Potassium: 4.2 meq/L (ref 3.5–5.3)
Protein, Total: 6.8 g/dL (ref 6.0–8.3)
Sodium: 140 meq/L (ref 135–145)

## 2023-06-10 MED ORDER — SEMAGLUTIDE-WEIGHT MANAGEMENT 0.25 MG/0.5ML SC SOAJ
0.2500 mg | SUBCUTANEOUS | 0 refills | Status: DC
Start: 2023-06-10 — End: 2023-07-22

## 2023-06-10 NOTE — Addendum Note (Signed)
 Addended by: Ricki Miller on: 06/10/2023 08:58 AM     Modules accepted: Orders

## 2023-06-10 NOTE — Progress Notes (Signed)
 Called and [employer].  Indications are obesity, hypertension, hyperlipidemia, and sleep apnea

## 2023-06-14 ENCOUNTER — Telehealth: Payer: Self-pay

## 2023-06-14 DIAGNOSIS — K760 Fatty (change of) liver, not elsewhere classified: Secondary | ICD-10-CM | POA: Insufficient documentation

## 2023-06-14 NOTE — Addendum Note (Signed)
 Addended by: Sarina Ill on: 06/14/2023 11:46 AM     Modules accepted: Orders

## 2023-06-14 NOTE — Progress Notes (Signed)
 New order provided for GI evaluation for hepatic steatosis.  Would like to consider starting GLP-1's but would benefit from a hepatic workup with GI

## 2023-06-14 NOTE — Telephone Encounter (Addendum)
 Pt is calling to schedule appt for Elevated LFTs. He refused to schedule appt with Cher[facility] I was able to book appt with Dr. Yetta Glassman for 07/18 but pt is upset that he has to wait for so long. PT is a [facility] PT. [phone]

## 2023-06-23 NOTE — Progress Notes (Signed)
 CC: Abnormal LFTs    HPI:  53 y/o M with PMH of HLD, HTN and OSA who presents with elevated LFTs    Patient noted to have elevated AST/ALT dating back to 10/2019.  Patient was tested for HCV and was negative. He also had an Abd US  (08/2022) that revealed hepatic steatosis.  Patient has HTN and HLD and managed on medications.  He does not have diabetes but has had issues with his weight. Patient has tried losing weight with diet and exercise but has been unsuccessful. His PCP is trying to obtain Wegovy  for weight loss assistance. Patient states he drinks once a month and can have 4-5 drinks on one sitting. He denies any daily alcohol use. Patient states his wife had cirrhosis c/b HCC and had liver transplant done and now doing well.  Patient denies any prior IVDU.  He denies any ascites, LE edema or confusion.       Past Medical History:   Diagnosis Date    H/O Coronary CT Angiogram 10/2010    H/O echocardiogram 10/2014    H/O stress echocardiogram 02/2009    normal    H/O treadmill stress test 01/2008, 05/2010, 11/2015, 12/12/15    Hyperlipidemia     MANAGED WITH MEDS.    Hypertension     controlled w/med per pt    Sleep apnea     +OSA - USES CPAP NIGHTLY.        Past Surgical History[1]     Medications Ordered Prior to Encounter[2]     Allergies[3]     Family History[4]     Social History[5]     ROS: 14 point ROS obtained. As per HPI, otherwise negative.     Physical Exam:  Visit Vitals  BP 137/81 (BP Site: Right arm, Patient Position: Sitting, Cuff Size: Large)   Pulse 65   Temp 97.9 F (36.6 C) (Oral)   Resp 16   Ht 1.803 m (5' 11)   Wt 130.5 kg (287 lb 9.6 oz)   SpO2 97%   BMI 40.11 kg/m      GENERAL: NAD, sitting in chair  HEENT: PERRL, EOMI, no scleral icterus  CARDIAC: RRR, S1S2, no m/r/g  RESP: CTAB, no w/r/r  ABD: Soft, NT, ND, +BS, no ascites  MSK: Normal ROM, no LE edema bilaterally  NEURO: AAOx3, no asterixis    Labs:  Component      Latest Ref Rng 08/08/2019 11/26/2019 02/16/2022 02/16/2023 06/10/2023    Glucose      70 - 100 mg/dL     893 (H)    BUN      9 - 28 mg/dL     21    Creatinine      0.5 - 1.5 mg/dL     1.2    Sodium      135 - 145 mEq/L     140    Potassium      3.5 - 5.3 mEq/L     4.2    Chloride      99 - 111 mEq/L     105    CO2      17 - 29 mEq/L     27    Calcium       8.5 - 10.5 mg/dL     9.2    Anion Gap      5.0 - 15.0      8.0    EGFR      >=60.0 mL/min/1.73 m2     >  60.0    AST      <=41 U/L     53 (H)    ALT      <=55 U/L     85 (H)    Alkaline Phosphatase      37 - 117 U/L     66    Albumin      3.5 - 5.0 g/dL     4.3    Protein Total      6.0 - 8.3 g/dL     6.8    Globulin      2.0 - 3.6 g/dL     2.5    Albumin/Globulin Ratio      0.9 - 2.2      1.7    Bilirubin Total      0.2 - 1.2 mg/dL     1.0    Cholesterol      <=199 mg/dL    866     Triglycerides      34 - 149 mg/dL    842 (H)     HDL      >=40 mg/dL    35 (L)     LDL Calculated      0 - 99 mg/dL    67     VLDL Calculated      10 - 40 mg/dL    31     Cholesterol / HDL Ratio      Index    3.8     Iron      41 - 168 ug/dL   90  67     UIBC      126 - 382 ug/dL    748     TIBC      738 - 462 ug/dL   668  681     Iron Saturation      15 - 50 %   27  21     Hemoglobin A1C      4.6 - 5.6 %    5.1     Average Estimated Glucose      mg/dL    00.2     Hepatitis C, AB      Non Reactive  Non-Reactive        HEPATITIS B SURFACE ANTIBODY  <8.00       Ferritin      38 - 380 ng/mL   174         Imaging:  Abd US  (08/2022)  IMPRESSION:   1. No gallstones or cholecystitis. No biliary dilatation.   2. Fatty infiltration.  3. Pancreas is not seen due to overlying bowel gas.    Procedures:  Colonoscopy (11/2021)  Two polyps    Assessment and Plan:  53 y/o M with PMH of HLD, HTN and OSA who presents with elevated LFTs    #Elevated LFTs  First elevation noted in 10/2019 (AST 40 ALT 102)  Normalized 2022 and elevated again in 2023  Prior CLD work up: HCV Ab negative and Abd US  (08/2022) with hepatic steatosis  Ddx includes Fatty liver vs DILI vs Autoimmune vs  Viral hepatitis. Most likely related to [facility] given pattern of elevation, HTN, HLD and hepatic steatosis on imaging. However, there has been no CLD work up in the past other than a negative HCV Ab.  Thus, will obtain viral and autoimmune work up.  In addition, will need to obtain Fibroscan to assess fibrosis.  If advanced fibrosis is present, then would be  more aggressive with weight loss strategies including GLP-1 agonists. If not approved, we could try resmetirom.   - Obtain viral serologies  - Obtain autoimmune markers  - Obtain Fibroscan   - Follow up in 3 months    Time spent reviewing the chart, labs, imaging, procedures and discussing the diagnosis with patient and family minus any procedures performed: 44 minutes     Bernida JINNY Downy, MD          [1]   Past Surgical History:  Procedure Laterality Date    COLONOSCOPY, DIAGNOSTIC (SCREENING) N/A 12/03/2021    Repeat 5 years. Tubular Adenoma;  Surgeon: Erika Liza RAMAN, MD;  Location: [facility];  Service: Gastroenterology;    rotator cuff surgery Left 07/01/2022    WISDOM TOOTH EXTRACTION     [2]   Current Outpatient Medications on File Prior to Visit   Medication Sig Dispense Refill    atorvastatin  (LIPITOR) 40 MG tablet Take 1 tablet (40 mg) by mouth daily 7 tablet 0    losartan -hydrochlorothiazide (HYZAAR) 100-25 MG per tablet Take 1 tablet by mouth daily 7 tablet 0    semaglutide  (WEGOVY ) 0.25 MG/0.5ML injection Inject 0.5 mLs (0.25 mg) into the skin once a week (Patient not taking: Reported on 06/24/2023) 2 mL 0     No current facility-administered medications on file prior to visit.   [3] No Known Allergies  [4]   Family History  Problem Relation Name Age of Onset    Diverticulitis Mother      Cancer Father Sven Pinheiro 49        Prostate, Bladder    Heart attack Father Barnie Sopko 54        5 MI's    Stroke Father Gerren Hoffmeier 55        2 strokes    Hypertension Father Rannie Craney     Diabetes Father Jowan Skillin     Hyperlipidemia Father Alferd Obryant     Heart disease  Father Dade Rodin     Myocardial Infarction Father Arvle Grabe     No known problems Sister      No known problems Brother      No known problems Paternal Grandmother      COPD Paternal Grandfather Chayden Garrelts     Heart attack Paternal Grandfather Kaven Cumbie     Diabetes Paternal Grandfather Hewitt Garner     Heart disease Paternal Grandparent      Diabetes Paternal Grandparent      Emphysema Paternal Grandparent          pulmonary    No known problems Son Signe     Appendicitis Son Signa     Asthma Son Joanell     No known problems Son Shyrl     Pancreatic cancer Paternal Uncle  40   [5]   Social History  Tobacco Use    Smoking status: Former     Current packs/day: 0.00     Average packs/day: 0.3 packs/day for 2.0 years (0.5 ttl pk-yrs)     Types: Cigarettes     Start date: 03/04/2009     Quit date: 03/05/2011     Years since quitting: 12.3    Smokeless tobacco: Former     Types: Snuff     Quit date: 05/31/2012    Tobacco comments:     Social use. 5-7 cig/week   Vaping Use    Vaping status: Never Used   Substance Use Topics  Alcohol use: Yes     Alcohol/week: 7.0 standard drinks of alcohol     Types: 7 Drinks containing 0.5 oz of alcohol per week     Comment: < weekly basis    Drug use: Never

## 2023-06-24 ENCOUNTER — Other Ambulatory Visit (FREE_STANDING_LABORATORY_FACILITY): Payer: No Typology Code available for payment source

## 2023-06-24 ENCOUNTER — Encounter (INDEPENDENT_AMBULATORY_CARE_PROVIDER_SITE_OTHER): Payer: Self-pay | Admitting: Internal Medicine

## 2023-06-24 ENCOUNTER — Ambulatory Visit (INDEPENDENT_AMBULATORY_CARE_PROVIDER_SITE_OTHER): Payer: No Typology Code available for payment source | Admitting: Internal Medicine

## 2023-06-24 VITALS — BP 137/81 | HR 65 | Temp 97.9°F | Resp 16 | Ht 71.0 in | Wt 287.6 lb

## 2023-06-24 DIAGNOSIS — K760 Fatty (change of) liver, not elsewhere classified: Secondary | ICD-10-CM

## 2023-06-24 DIAGNOSIS — R7989 Other specified abnormal findings of blood chemistry: Secondary | ICD-10-CM

## 2023-06-24 LAB — ALPHA-1-ANTITRYPSIN: Alpha 1-Antitrypsin: 107.8 mg/dL (ref 84.0–200.0)

## 2023-06-24 LAB — HEPATITIS A ANTIBODY, IGM: Hepatitis A Antibody, IgM: NONREACTIVE

## 2023-06-24 LAB — HEPATITIS B (HBV) SURFACE ANTIGEN WITH REFLEX TO CONFIRMATION: Hepatitis B Surface Antigen: NONREACTIVE

## 2023-06-24 LAB — GGT: GGT: 40 U/L (ref 11–88)

## 2023-06-24 LAB — IRON PROFILE
Iron Saturation: 23 % (ref 15–50)
Iron: 68 ug/dL (ref 41–168)
TIBC: 299 ug/dL (ref 261–462)
UIBC: 231 ug/dL (ref 126–382)

## 2023-06-24 LAB — HEPATITIS A ANTIBODY, IGG: Hepatitis A Antibody, IgG: NONREACTIVE

## 2023-06-24 LAB — IMMUNOGLOBULINS IGG, IGA, IGM, QUANTITATIVE
Immunoglobulin A: 127 mg/dL (ref 63–484)
Immunoglobulin G: 710 mg/dL (ref 540–1822)
Immunoglobulin M: 42 mg/dL (ref 22–293)

## 2023-06-24 LAB — CERULOPLASMIN: Ceruloplasmin: 25 mg/dL (ref 20–60)

## 2023-06-24 LAB — FERRITIN: Ferritin: 269.9 ng/mL (ref 21.80–274.70)

## 2023-06-24 LAB — HEPATITIS B SURFACE (HBV) ANTIBODY, QUANTITATIVE: Hepatitis B Surface Antibody: <3.31 m[IU]/mL

## 2023-06-24 LAB — HEPATITIS B CORE ANTIBODY, TOTAL: Hepatitis B Core Total Antibody: NONREACTIVE

## 2023-06-24 NOTE — Patient Instructions (Signed)
 Labs today  Fibroscan  Follow up 3-4 months

## 2023-06-27 LAB — SMOOTH MUSCLE ANTIBODY WITH REFLEX TO TITER: Smooth Muscle Antibody: NEGATIVE

## 2023-06-27 LAB — MITOCHONDRIAL ANTIBODY WITH REFLEX TO TITER: Mitochondrial Antibody Screen: NEGATIVE

## 2023-06-30 ENCOUNTER — Other Ambulatory Visit: Payer: Self-pay | Admitting: Internal Medicine

## 2023-06-30 ENCOUNTER — Telehealth: Payer: Self-pay | Admitting: Internal Medicine

## 2023-06-30 ENCOUNTER — Telehealth (INDEPENDENT_AMBULATORY_CARE_PROVIDER_SITE_OTHER): Payer: Self-pay

## 2023-06-30 DIAGNOSIS — I10 Essential (primary) hypertension: Secondary | ICD-10-CM

## 2023-07-20 ENCOUNTER — Other Ambulatory Visit: Payer: Self-pay | Admitting: Internal Medicine

## 2023-07-20 DIAGNOSIS — G4733 Obstructive sleep apnea (adult) (pediatric): Secondary | ICD-10-CM

## 2023-07-20 DIAGNOSIS — E669 Obesity, unspecified: Secondary | ICD-10-CM

## 2023-07-20 DIAGNOSIS — E7800 Pure hypercholesterolemia, unspecified: Secondary | ICD-10-CM

## 2023-07-20 DIAGNOSIS — I10 Essential (primary) hypertension: Secondary | ICD-10-CM

## 2023-07-21 ENCOUNTER — Telehealth: Payer: Self-pay | Admitting: Internal Medicine

## 2023-07-21 ENCOUNTER — Encounter: Payer: Self-pay | Admitting: Internal Medicine

## 2023-07-21 ENCOUNTER — Other Ambulatory Visit: Payer: Self-pay | Admitting: Internal Medicine

## 2023-07-21 NOTE — Telephone Encounter (Signed)
 Mr. Sada called from the pharmacy about Wegovy  authorization and if Dr. Luke spoke with insurance. He would like to discuss how to get this taken care of. He will go ahead and pick up and pay out of pocket today as it is needed.     I see notes w/ dr luke and patient that Kat may have started auth.    Please follow up and advise patient of status.     Thank you,

## 2023-07-21 NOTE — Telephone Encounter (Signed)
 Pt called back stating his insurance wont cover wagovy  They will cover Ozempic  or Wilford Grist PA. Please advise

## 2023-07-21 NOTE — Telephone Encounter (Signed)
 Prior Guy Martinez was denied. See other case from [place].

## 2023-07-22 ENCOUNTER — Other Ambulatory Visit: Payer: Self-pay | Admitting: Internal Medicine

## 2023-07-22 DIAGNOSIS — G4733 Obstructive sleep apnea (adult) (pediatric): Secondary | ICD-10-CM

## 2023-07-22 DIAGNOSIS — E669 Obesity, unspecified: Secondary | ICD-10-CM

## 2023-07-22 MED ORDER — TIRZEPATIDE-WEIGHT MANAGEMENT 2.5 MG/0.5ML SC SOAJ
2.5000 mg | SUBCUTANEOUS | 0 refills | Status: DC
Start: 2023-07-22 — End: 2023-08-12

## 2023-07-22 NOTE — Progress Notes (Signed)
 Wegovy denied by insurance. Zepbound sent in under codes of obesity and OSA.

## 2023-07-22 NOTE — Telephone Encounter (Signed)
 Did Guy Martinez receive the prior auth form for Zepbound. The form has the KEY that I need to do the PA. If they received it, can the form be faxed over here at [facility]?

## 2023-07-22 NOTE — Telephone Encounter (Signed)
 Spoke with patient.  Currently working through prior [employer] for [employer].  Will update him once the results are back.

## 2023-07-27 NOTE — Progress Notes (Signed)
 [facility] that was sent 07/22/23. Pharmacist states drug is not covered . Pharmacist also states that  she can see a message on the order stating 'MD denied PA request and [facility] cannot initiate prior auth for Zepbound.

## 2023-07-28 NOTE — Progress Notes (Signed)
 Denial letter for Zepbound scanned into patient's chart.

## 2023-07-28 NOTE — Telephone Encounter (Signed)
 Prior Auth for [employer] submitted.

## 2023-07-28 NOTE — Telephone Encounter (Signed)
 Denial letter for Zepbound scanned.

## 2023-07-29 ENCOUNTER — Other Ambulatory Visit: Payer: Self-pay | Admitting: Internal Medicine

## 2023-07-29 DIAGNOSIS — E669 Obesity, unspecified: Secondary | ICD-10-CM

## 2023-07-29 DIAGNOSIS — I10 Essential (primary) hypertension: Secondary | ICD-10-CM

## 2023-07-29 DIAGNOSIS — G4733 Obstructive sleep apnea (adult) (pediatric): Secondary | ICD-10-CM

## 2023-07-29 DIAGNOSIS — E7800 Pure hypercholesterolemia, unspecified: Secondary | ICD-10-CM

## 2023-07-29 MED ORDER — SEMAGLUTIDE-WEIGHT MANAGEMENT 0.25 MG/0.5ML SC SOAJ
0.2500 mg | SUBCUTANEOUS | 0 refills | Status: DC
Start: 2023-07-29 — End: 2023-08-12

## 2023-08-12 ENCOUNTER — Ambulatory Visit (INDEPENDENT_AMBULATORY_CARE_PROVIDER_SITE_OTHER): Payer: No Typology Code available for payment source

## 2023-08-12 ENCOUNTER — Encounter (INDEPENDENT_AMBULATORY_CARE_PROVIDER_SITE_OTHER): Payer: Self-pay | Admitting: Cardiovascular Disease

## 2023-08-12 ENCOUNTER — Ambulatory Visit (INDEPENDENT_AMBULATORY_CARE_PROVIDER_SITE_OTHER): Payer: No Typology Code available for payment source | Admitting: Cardiovascular Disease

## 2023-08-12 DIAGNOSIS — I2510 Atherosclerotic heart disease of native coronary artery without angina pectoris: Secondary | ICD-10-CM

## 2023-08-12 DIAGNOSIS — E7800 Pure hypercholesterolemia, unspecified: Secondary | ICD-10-CM

## 2023-08-12 DIAGNOSIS — E782 Mixed hyperlipidemia: Secondary | ICD-10-CM

## 2023-08-12 DIAGNOSIS — R7989 Other specified abnormal findings of blood chemistry: Secondary | ICD-10-CM

## 2023-08-12 NOTE — Progress Notes (Unsigned)
 Fibroscan completed, patient confirmed fasting for 3h prior to exam, reviewed provider will reach out with result interpretation

## 2023-08-12 NOTE — Progress Notes (Signed)
 [place]  HEART CARDIOLOGY OFFICE PROGRESS NOTE    HRT [facility] OFFICE  [place]  HEART [facility] OFFICE -CARDIOLOGY  [address]  [place] [address]  Dept: [phone]  Dept Fax: [phone]       Patient Name: [facility]    Date of Visit:  August 12, 2023  Date of Birth: 11/02/1970  AGE: 53 y.o.  Medical Record #: [id number]  Requesting Physician: Ozell CINDERELLA Cramp, MD      CHIEF COMPLAINT: Hyperlipidemia      HISTORY OF PRESENT ILLNESS:    He is a pleasant 54 y.o. male who presents today for routine follow-up.    No acute complaints.  He has been struggling with weight.  He has gained about 10 pounds recently.  Try to get on  GLP-1 agonist but was not approved by insurance.  No angina or dyspnea.  No claudication.  Taking medications as prescribed    PAST MEDICAL HISTORY: He has a past medical history of H/O Coronary CT Angiogram (10/2010), H/O echocardiogram (10/2014), H/O stress echocardiogram (02/2009), H/O treadmill stress test (01/2008, 05/2010, 11/2015, 12/12/15), Hyperlipidemia, Hypertension, and Sleep apnea. He has a past surgical history that includes Wisdom tooth extraction; COLONOSCOPY, DIAGNOSTIC (SCREENING) (N/A, 12/03/2021); and rotator cuff surgery (Left, 07/01/2022).    Allergies[1]    MEDICATIONS:   Patient's current medications were reviewed. ONLY Cardiac medications were updated unless others were addressed in assessment and plan.    Current Outpatient Medications   Medication Instructions    atorvastatin  (LIPITOR) 40 mg, Oral, Daily    losartan -hydrochlorothiazide (HYZAAR) 100-25 MG per tablet 1 tablet, Oral, Daily       FAMILY HISTORY: family history includes Appendicitis in his son; Asthma in his son; COPD in his paternal grandfather; Cancer (age of onset: 33) in his father; Diabetes in his father, paternal grandfather, and paternal grandparent; Diverticulitis in his mother; Emphysema in his paternal grandparent; Heart attack in his paternal grandfather; Heart attack (age of  onset: 39) in his father; Heart disease in his father and paternal grandparent; Hyperlipidemia in his father; Hypertension in his father; Myocardial Infarction in his father; No known problems in his brother, paternal grandmother, sister, son, and son; Pancreatic cancer (age of onset: 50) in his paternal uncle; Stroke (age of onset: 9) in his father.    SOCIAL HISTORY: He reports that he quit smoking about 12 years ago. His smoking use included cigarettes. He started smoking about 14 years ago. He has a 0.5 pack-year smoking history. He quit smokeless tobacco use about 11 years ago.  His smokeless tobacco use included snuff. He reports current alcohol use of about 13.0 standard drinks of alcohol per week. He reports that he does not use drugs.    PHYSICAL EXAMINATION    Visit Vitals  BP 120/70 (BP Site: Right arm, Patient Position: Sitting, Cuff Size: Large)   Pulse 72   Ht 1.803 m (5' 11)   Wt 129.7 kg (286 lb)   SpO2 97%   BMI 39.89 kg/m       Constitutional: Cooperative, alert, no acute distress.  Neck: No carotid bruits, JVP normal.  Cardiac: Regular rate and rhythm, normal S1 and S2; no S3 or S4. No murmurs. No rubs, no gallops.  Pulmonary: Clear to auscultation bilaterally, no wheezing, no rhonchi, no rales.  Abdomen: soft, non-distended, non-TTP  Extremities: no edema.  Vascular: +2 pulses in radial artery bilaterally, 2+ pedal pulses bilaterally.    LABS REVIEWED:   Lab Results   Component Value  Date    WBC 6.14 06/10/2023    HGB 14.0 06/10/2023    HCT 39.3 06/10/2023    PLT 214 06/10/2023     Lab Results   Component Value Date    GLU 106 (H) 06/10/2023    BUN 21 06/10/2023    CREAT 1.2 06/10/2023    NA 140 06/10/2023    K 4.2 06/10/2023    CL 105 06/10/2023    CO2 27 06/10/2023    AST 53 (H) 06/10/2023    ALT 85 (H) 06/10/2023     Lab Results   Component Value Date    MG 2.2 08/10/2017    TSH 3.39 02/16/2023    HGBA1C 5.1 02/16/2023    BNP 15.9 08/10/2017     Lab Results   Component Value Date    CHOL  133 02/16/2023    TRIG 157 (H) 02/16/2023    HDL 35 (L) 02/16/2023    LDL 67 02/16/2023       IMPRESSION:   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 CAC 126, 92nd percentile 12/2019.  Asymptomatic carotid atherosclerosis less than 50% bilateral in 03/2022  Hypertension-adequately controlled today.  Continue current pharmacotherapy  Hyperlipidemia-on atorvastatin  40 mg with lipids nearly at goal  Family history of coronary disease-father had his first event in his 34s; Lp(a) 7 (2023)  Normal ETT in 2021.  Overweight with recent weight gain  Sleep apnea on CPAP      RECOMMENDATIONS:  Continue current cardiac medications.  Atorvastatin  40 mg daily  Losartan -hydrochlorothiazide 100/25 mg daily  Heart of the diet and routine exercise  He is going to work on developing a routine to help with his weight  Follow-up in 6 months with myself                                                 Orders Placed This Encounter   Procedures    Lipid Panel    Lipid Panel    Office Visit (HRT [place])     SIGNED:    Camellia JONETTA Law, MD          This note was generated by the Dragon speech recognition and may contain errors or omissions not intended by the user. Grammatical errors, random word insertions, deletions, pronoun errors, and incomplete sentences are occasional consequences of this technology due to software limitations. Not all errors are caught or corrected. If there are questions or concerns about the content of this note or information contained within the body of this dictation, they should be addressed directly with the author for clarification.         [1] No Known Allergies

## 2023-08-19 ENCOUNTER — Encounter: Payer: Self-pay | Admitting: Internal Medicine

## 2023-08-19 ENCOUNTER — Ambulatory Visit (INDEPENDENT_AMBULATORY_CARE_PROVIDER_SITE_OTHER): Payer: No Typology Code available for payment source | Admitting: Internal Medicine

## 2023-08-19 ENCOUNTER — Other Ambulatory Visit: Payer: Self-pay | Admitting: Internal Medicine

## 2023-08-19 DIAGNOSIS — E669 Obesity, unspecified: Secondary | ICD-10-CM

## 2023-08-19 DIAGNOSIS — I10 Essential (primary) hypertension: Secondary | ICD-10-CM

## 2023-08-19 DIAGNOSIS — G4733 Obstructive sleep apnea (adult) (pediatric): Secondary | ICD-10-CM

## 2023-08-19 MED ORDER — TIRZEPATIDE-WEIGHT MANAGEMENT 2.5 MG/0.5ML SC SOAJ
2.5000 mg | SUBCUTANEOUS | 0 refills | Status: DC
Start: 2023-08-19 — End: 2023-08-19

## 2023-08-19 NOTE — Progress Notes (Signed)
 Started on Zepbound.  Notable medical issues include obesity, hypertension, sleep apnea, and fatty liver.  Has been seen by GI who strongly recommends reducing cholesterol panel to help prevent progression of fatty liver.  No known per history or family history of thyroid cancer.  Will start at Zepbound 2.5 mg and titrate up as long as patient has no adverse effects and repeat CMP is stable or normal.  Asked patient to connect with our lifestyle coach Velna Hatchet.

## 2023-08-24 ENCOUNTER — Ambulatory Visit: Payer: Self-pay | Admitting: Internal Medicine

## 2023-08-24 ENCOUNTER — Encounter: Payer: Self-pay | Admitting: Internal Medicine

## 2023-08-24 ENCOUNTER — Ambulatory Visit: Attending: Internal Medicine

## 2023-08-24 ENCOUNTER — Other Ambulatory Visit: Payer: Self-pay | Admitting: Internal Medicine

## 2023-08-24 DIAGNOSIS — E7800 Pure hypercholesterolemia, unspecified: Secondary | ICD-10-CM

## 2023-08-24 DIAGNOSIS — K760 Fatty (change of) liver, not elsewhere classified: Secondary | ICD-10-CM

## 2023-08-24 DIAGNOSIS — E669 Obesity, unspecified: Secondary | ICD-10-CM

## 2023-08-24 DIAGNOSIS — I10 Essential (primary) hypertension: Secondary | ICD-10-CM | POA: Insufficient documentation

## 2023-08-24 DIAGNOSIS — E782 Mixed hyperlipidemia: Secondary | ICD-10-CM

## 2023-08-24 DIAGNOSIS — G4733 Obstructive sleep apnea (adult) (pediatric): Secondary | ICD-10-CM | POA: Insufficient documentation

## 2023-08-24 LAB — COMPREHENSIVE METABOLIC PANEL
ALT: 100 U/L — ABNORMAL HIGH (ref <=55)
AST (SGOT): 59 U/L — ABNORMAL HIGH (ref <=41)
Albumin/Globulin Ratio: 1.7 (ref 0.9–2.2)
Albumin: 4.5 g/dL (ref 3.5–5.0)
Alkaline Phosphatase: 56 U/L (ref 37–117)
Anion Gap: 8 (ref 5.0–15.0)
BUN: 21 mg/dL (ref 9–28)
Bilirubin, Total: 1.5 mg/dL — ABNORMAL HIGH (ref 0.2–1.2)
CO2: 28 meq/L (ref 17–29)
Calcium: 9.3 mg/dL (ref 8.5–10.5)
Chloride: 102 meq/L (ref 99–111)
Creatinine: 1.2 mg/dL (ref 0.5–1.5)
GFR: >60 mL/min/{1.73_m2} (ref >=60.0)
Globulin: 2.6 g/dL (ref 2.0–3.6)
Glucose: 97 mg/dL (ref 70–100)
Potassium: 4.4 meq/L (ref 3.5–5.3)
Protein, Total: 7.1 g/dL (ref 6.0–8.3)
Sodium: 138 meq/L (ref 135–145)

## 2023-08-24 LAB — LIPID PANEL
Cholesterol / HDL Ratio: 5.1 {index}
Cholesterol: 157 mg/dL (ref <=199)
HDL: 31 mg/dL — ABNORMAL LOW (ref >=40)
LDL Calculated: 91 mg/dL (ref 0–99)
Triglycerides: 174 mg/dL — ABNORMAL HIGH (ref 34–149)
VLDL Calculated: 35 mg/dL (ref 10–40)

## 2023-09-01 ENCOUNTER — Ambulatory Visit (INDEPENDENT_AMBULATORY_CARE_PROVIDER_SITE_OTHER): Payer: Self-pay

## 2023-09-01 DIAGNOSIS — E782 Mixed hyperlipidemia: Secondary | ICD-10-CM

## 2023-09-01 DIAGNOSIS — I2510 Atherosclerotic heart disease of native coronary artery without angina pectoris: Secondary | ICD-10-CM

## 2023-09-01 DIAGNOSIS — E7800 Pure hypercholesterolemia, unspecified: Secondary | ICD-10-CM

## 2023-09-01 NOTE — Telephone Encounter (Addendum)
-----   Message from Camellia JONETTA Law, MD sent at 09/01/2023  2:48 PM EDT -----  Triglycerides and LDL are a bit higher than we'd like.  I'd like for him to focus on diet, exercise, weight loss and recheck labs in 3-4 months to see improvement      Spoke with pt to relay results/recommendations from Dr Law. Pt v/u- lab orders placed.

## 2023-09-09 ENCOUNTER — Other Ambulatory Visit: Payer: Self-pay | Admitting: Internal Medicine

## 2023-09-09 DIAGNOSIS — K760 Fatty (change of) liver, not elsewhere classified: Secondary | ICD-10-CM

## 2023-09-09 DIAGNOSIS — E669 Obesity, unspecified: Secondary | ICD-10-CM

## 2023-09-09 MED ORDER — TIRZEPATIDE-WEIGHT MANAGEMENT 5 MG/0.5ML SC SOAJ
5.0000 mg | SUBCUTANEOUS | 0 refills | Status: DC
Start: 2023-09-09 — End: 2023-09-09

## 2023-09-09 NOTE — Telephone Encounter (Signed)
 Patient was having some constipation starting Zepbound  but has improved.  Will be coming in for repeat CMP and have called in the next dose of Zepbound  at 5.0 mg.

## 2023-09-15 ENCOUNTER — Ambulatory Visit: Attending: Cardiovascular Disease

## 2023-09-15 ENCOUNTER — Ambulatory Visit: Payer: Self-pay | Admitting: Geriatric Medicine

## 2023-09-15 DIAGNOSIS — K760 Fatty (change of) liver, not elsewhere classified: Secondary | ICD-10-CM | POA: Insufficient documentation

## 2023-09-15 DIAGNOSIS — I2510 Atherosclerotic heart disease of native coronary artery without angina pectoris: Secondary | ICD-10-CM | POA: Insufficient documentation

## 2023-09-15 DIAGNOSIS — E669 Obesity, unspecified: Secondary | ICD-10-CM | POA: Insufficient documentation

## 2023-09-15 DIAGNOSIS — E782 Mixed hyperlipidemia: Secondary | ICD-10-CM | POA: Insufficient documentation

## 2023-09-15 DIAGNOSIS — E7800 Pure hypercholesterolemia, unspecified: Secondary | ICD-10-CM | POA: Insufficient documentation

## 2023-09-15 LAB — COMPREHENSIVE METABOLIC PANEL
ALT: 63 U/L — ABNORMAL HIGH (ref <=55)
AST (SGOT): 42 U/L — ABNORMAL HIGH (ref <=41)
Albumin/Globulin Ratio: 2.1 (ref 0.9–2.2)
Albumin: 4.6 g/dL (ref 3.5–5.0)
Alkaline Phosphatase: 76 U/L (ref 37–117)
Anion Gap: 8 (ref 5.0–15.0)
BUN: 29 mg/dL — ABNORMAL HIGH (ref 9–28)
Bilirubin, Total: 1 mg/dL (ref 0.2–1.2)
CO2: 27 meq/L (ref 17–29)
Calcium: 9.6 mg/dL (ref 8.5–10.5)
Chloride: 103 meq/L (ref 99–111)
Creatinine: 1.3 mg/dL (ref 0.5–1.5)
GFR: >60 mL/min/{1.73_m2} (ref >=60.0)
Globulin: 2.2 g/dL (ref 2.0–3.6)
Glucose: 99 mg/dL (ref 70–100)
Potassium: 4.2 meq/L (ref 3.5–5.3)
Protein, Total: 6.8 g/dL (ref 6.0–8.3)
Sodium: 138 meq/L (ref 135–145)

## 2023-09-15 LAB — LIPID PANEL
Cholesterol / HDL Ratio: 4.7 {index}
Cholesterol: 108 mg/dL (ref <=199)
HDL: 23 mg/dL — ABNORMAL LOW (ref >=40)
LDL Calculated: 63 mg/dL (ref 0–99)
Triglycerides: 112 mg/dL (ref 34–149)
VLDL Calculated: 22 mg/dL (ref 10–40)

## 2023-09-16 ENCOUNTER — Other Ambulatory Visit: Payer: Self-pay | Admitting: Internal Medicine

## 2023-09-16 ENCOUNTER — Ambulatory Visit (INDEPENDENT_AMBULATORY_CARE_PROVIDER_SITE_OTHER): Payer: Self-pay | Admitting: Cardiovascular Disease

## 2023-09-16 DIAGNOSIS — E7800 Pure hypercholesterolemia, unspecified: Secondary | ICD-10-CM

## 2023-09-16 NOTE — Progress Notes (Signed)
 Recent test results were reviewed. The following message was sent to the patient through MyChart:    Guy Martinez:    The results from your recent test(s) returned for your review in MyChart.  Your cholesterol is much better  LDL came down from 91 to 63  Triglycerides came down from 174 to 112  [place] work!  No changes to your medications at this time. We can recheck lipids prior to your October visit    Thank you,  Adela Ades, MD, [facility], [facility]  [place]  Heart, Interventional Cardiology

## 2023-09-19 ENCOUNTER — Other Ambulatory Visit (INDEPENDENT_AMBULATORY_CARE_PROVIDER_SITE_OTHER): Payer: Self-pay | Admitting: Cardiovascular Disease

## 2023-09-19 DIAGNOSIS — E782 Mixed hyperlipidemia: Secondary | ICD-10-CM

## 2023-09-19 NOTE — Progress Notes (Signed)
 CC: Abnormal LFTs    HPI:  53 y/o M with PMH of HLD, HTN and OSA who presents with elevated LFTs    Initial Visit:  Patient noted to have elevated AST/ALT dating back to 10/2019.  Patient was tested for HCV and was negative. He also had an Abd US  (08/2022) that revealed hepatic steatosis.  Patient has HTN and HLD and managed on medications.  He does not have diabetes but has had issues with his weight. Patient has tried losing weight with diet and exercise but has been unsuccessful. His PCP is trying to obtain [employer]  for weight loss assistance. Patient states he drinks once a month and can have 4-5 drinks on one sitting. He denies any daily alcohol use. Patient states his wife had cirrhosis c/b HCC and had liver transplant done and now doing well.  Patient denies any prior IVDU.  He denies any ascites, LE edema or confusion.     Last visit:  Obtain CLD work up and Fibroscan.   HAV IgM/IgG negative, HBsAg negative, HBcAb negative, HBsAb negative, AMA negative, ASMA negative, Iggs normal, ceruloplasmin normal and A1AT normal.   Fibroscan w/ LSM 7.5 kPa and CAP 309 dB/m    Today:  Weight of 287 lbs at last visit (06/2023)  Today's weight is 265 lbs  Started on Zepbound   LFTs improving    Past Medical History:   Diagnosis Date    H/O Coronary CT Angiogram 10/2010    H/O echocardiogram 10/2014    H/O stress echocardiogram 02/2009    normal    H/O treadmill stress test 01/2008, 05/2010, 11/2015, 12/12/15    Hyperlipidemia     MANAGED WITH MEDS.    Hypertension     controlled w/med per pt    Sleep apnea     +OSA - USES CPAP NIGHTLY.        Past Surgical History[1]     Medications Ordered Prior to Encounter[2]     Allergies[3]     Family History[4]     Social History[5]     ROS: 14 point ROS obtained. As per HPI, otherwise negative.     Physical Exam:  Visit Vitals  BP 112/76 (BP Site: Left arm, Patient Position: Sitting, Cuff Size: Large)   Pulse 63   Temp 98.1 F (36.7 C) (Oral)   Resp 15   Ht 1.803 m (5\' 11" )   Wt 120.5  kg (265 lb 9.6 oz)   SpO2 98%   BMI 37.04 kg/m     GENERAL: NAD, sitting in chair  HEENT: PERRL, EOMI, no scleral icterus  CARDIAC: RRR, S1S2, no m/r/g  RESP: CTAB, no w/r/r  ABD: Soft, NT, ND, +BS, no ascites  MSK: Normal ROM, no LE edema bilaterally  NEURO: AAOx3, no asterixis    Labs:  Component      Latest Ref Rng 08/08/2019 11/26/2019 02/16/2022 02/16/2023 06/10/2023   Glucose      70 - 100 mg/dL     161 (H)    BUN      9 - 28 mg/dL     21    Creatinine      0.5 - 1.5 mg/dL     1.2    Sodium      135 - 145 mEq/L     140    Potassium      3.5 - 5.3 mEq/L     4.2    Chloride      99 - 111 mEq/L  105    CO2      17 - 29 mEq/L     27    Calcium       8.5 - 10.5 mg/dL     9.2    Anion Gap      5.0 - 15.0      8.0    EGFR      >=60.0 mL/min/1.73 m2     >60.0    AST      <=41 U/L     53 (H)    ALT      <=55 U/L     85 (H)    Alkaline Phosphatase      37 - 117 U/L     66    Albumin      3.5 - 5.0 g/dL     4.3    Protein Total      6.0 - 8.3 g/dL     6.8    Globulin      2.0 - 3.6 g/dL     2.5    Albumin/Globulin Ratio      0.9 - 2.2      1.7    Bilirubin Total      0.2 - 1.2 mg/dL     1.0    Cholesterol      <=199 mg/dL    540     Triglycerides      34 - 149 mg/dL    981 (H)     HDL      >=40 mg/dL    35 (L)     LDL Calculated      0 - 99 mg/dL    67     VLDL Calculated      10 - 40 mg/dL    31     Cholesterol / HDL Ratio      Index    3.8     Iron      41 - 168 ug/dL   90  67     UIBC      126 - 382 ug/dL    191     TIBC      478 - 462 ug/dL   295  621     Iron Saturation      15 - 50 %   27  21     Hemoglobin A1C      4.6 - 5.6 %    5.1     Average Estimated Glucose      mg/dL    30.8     Hepatitis C, AB      Non Reactive  Non-Reactive        HEPATITIS B SURFACE ANTIBODY  <8.00       Ferritin      38 - 380 ng/mL   174         Component      Latest Ref Rng 06/24/2023 08/24/2023 09/15/2023   Glucose      70 - 100 mg/dL  97  99    BUN      9 - 28 mg/dL  21  29 (H)    Creatinine      0.5 - 1.5 mg/dL  1.2  1.3    Sodium       135 - 145 mEq/L  138  138    Potassium      3.5 - 5.3 mEq/L  4.4  4.2    Chloride  99 - 111 mEq/L  102  103    CO2      17 - 29 mEq/L  28  27    Calcium       8.5 - 10.5 mg/dL  9.3  9.6    Anion Gap      5.0 - 15.0   8.0  8.0    EGFR      >=60.0 mL/min/1.73 m2  >60.0  >60.0    AST      <=41 U/L  59 (H)  42 (H)    ALT      <=55 U/L  100 (H)  63 (H)    Alkaline Phosphatase      37 - 117 U/L  56  76    Albumin      3.5 - 5.0 g/dL  4.5  4.6    Protein Total      6.0 - 8.3 g/dL  7.1  6.8    Globulin      2.0 - 3.6 g/dL  2.6  2.2    Albumin/Globulin Ratio      0.9 - 2.2   1.7  2.1    Bilirubin Total      0.2 - 1.2 mg/dL  1.5 (H)  1.0    Iron      41 - 168 ug/dL 68      UIBC      098 - 382 ug/dL 119      TIBC      147 - 462 ug/dL 829      Iron Saturation      15 - 50 % 23      Immunoglobulin G      540 - 1,822 mg/dL 562      Immunoglobulin A      63 - 484 mg/dL 130      IgM      22 - 293 mg/dL 42      Alpha-1 Antitrypsin      84.0 - 200.0 mg/dL 865.7      Ceruloplasmin      20 - 60 mg/dL 25      Ferritin      84.69 - 274.70 ng/mL 269.90      GGT      11 - 88 U/L 40      Hepatitis A Ab, IGG      Non-Reactive  Non-Reactive      Hep A IgM      Non-Reactive  Non-Reactive      HEPATITIS B SURFACE ANTIBODY      mIU/mL <3.31      Hepatitis B Surface AG      Non-Reactive  Non-Reactive      Hepatitis B Core Total Antibody      Non-Reactive  Non-Reactive      Mitochondrial Antibody Screen      Negative  Negative      Smooth Muscle AB      NEGATIVE  NEGATIVE         Imaging:  Abd US  (08/2022)  IMPRESSION:   1. No gallstones or cholecystitis. No biliary dilatation.   2. Fatty infiltration.  3. Pancreas is not seen due to overlying bowel gas.    Fibroscan (07/2023)  LSM 7.5 kPa (IQR 11%)  CAP 309 dB/m    Procedures:  Colonoscopy (11/2021)  Two polyps    Assessment and Plan:  53 y/o M with PMH of HLD, HTN and OSA who presents with elevated LFTs    #  Elevated LFTs  #MASH  First elevation noted in 10/2019 (AST 40 ALT 102)  Normalized  2022 and elevated again in 2023  Prior CLD work up: HCV Ab negative and Abd US  (08/2022) with hepatic steatosis  Ddx includes Fatty liver vs DILI vs Autoimmune vs Viral hepatitis. Most likely related to [facility] given pattern of elevation, HTN, HLD and elevated CAP w/ F2 on Fibroscan. Viral less likely given negative serologies. Autoimmune less likely given male and negative markers. DILI less likely with no new medications or herbal supplements. Alcohol less likely given pattern of elevation and no significant alcohol use.    - Agree with Zepbound  (continue current dose; OK to increase if needed)  - Will follow up labs in 3 months  - Follow up in 1 year    Time spent reviewing the chart, labs, imaging, procedures and discussing the diagnosis with patient and family minus any procedures performed: 44 minutes     Argentina Kugel, MD          [1]   Past Surgical History:  Procedure Laterality Date    COLONOSCOPY, DIAGNOSTIC (SCREENING) N/A 12/03/2021    Repeat 5 years. Tubular Adenoma;  Surgeon: Lynford Sarin, MD;  Location: [facility];  Service: Gastroenterology;    rotator cuff surgery Left 07/01/2022    WISDOM TOOTH EXTRACTION     [2]   Current Outpatient Medications on File Prior to Visit   Medication Sig Dispense Refill    atorvastatin  (LIPITOR) 40 MG tablet TAKE 1 TABLET BY MOUTH EVERY DAY 90 tablet 3    losartan -hydrochlorothiazide (HYZAAR) 100-25 MG per tablet Take 1 tablet by mouth daily 7 tablet 0    tirzepatide  (Zepbound ) 5 MG/0.5ML injection INJECT 0.5 MLS (5 MG) INTO THE SKIN ONCE A WEEK 2 mL 0     No current facility-administered medications on file prior to visit.   [3] No Known Allergies  [4]   Family History  Problem Relation Name Age of Onset    Diverticulitis Mother      Cancer Father Jony Nuttall 41        Prostate, Bladder    Heart attack Father Dario Maka 45        4 MI's    Stroke Father Terez Millard 55        2 strokes    Hypertension Father Jakyrin Beman     Diabetes Father Hugh Creasman     Hyperlipidemia  Father Caulin Dalomba     Heart disease Father Edrei Loeber     Myocardial Infarction Father Daquain Contorno     No known problems Sister      No known problems Brother      No known problems Paternal Grandmother      COPD Paternal Grandfather Valin Hartsel     Heart attack Paternal Grandfather Zydarius Linkenhoker     Diabetes Paternal Grandfather Orva Christie     Heart disease Paternal Grandparent      Diabetes Paternal Grandparent      Emphysema Paternal Grandparent          pulmonary    No known problems Son Lanell Pinta     Appendicitis Son Manfred Seed     Asthma Son Gerard Knight     No known problems Son Alissa Irving     Pancreatic cancer Paternal Uncle  22   [5]   Social History  Tobacco Use    Smoking status: Former     Current packs/day: 0.00  Average packs/day: 0.3 packs/day for 2.0 years (0.5 ttl pk-yrs)     Types: Cigarettes     Start date: 03/04/2009     Quit date: 03/05/2011     Years since quitting: 12.5    Smokeless tobacco: Former     Types: Snuff     Quit date: 05/31/2012    Tobacco comments:     Social use. 5-7 cig/week   Vaping Use    Vaping status: Never Used   Substance Use Topics    Alcohol use: Yes     Alcohol/week: 13.0 standard drinks of alcohol     Types: 6 Cans of beer, 7 Drinks containing 0.5 oz of alcohol per week     Comment: Moderate drinker    Drug use: Never

## 2023-09-21 ENCOUNTER — Encounter: Payer: Self-pay | Admitting: Internal Medicine

## 2023-09-22 ENCOUNTER — Encounter (INDEPENDENT_AMBULATORY_CARE_PROVIDER_SITE_OTHER): Payer: Self-pay

## 2023-09-23 ENCOUNTER — Telehealth: Admitting: Physician Assistant

## 2023-09-26 ENCOUNTER — Ambulatory Visit (INDEPENDENT_AMBULATORY_CARE_PROVIDER_SITE_OTHER): Payer: No Typology Code available for payment source | Admitting: Internal Medicine

## 2023-09-26 ENCOUNTER — Encounter (INDEPENDENT_AMBULATORY_CARE_PROVIDER_SITE_OTHER): Payer: Self-pay | Admitting: Internal Medicine

## 2023-09-26 VITALS — BP 112/76 | HR 63 | Temp 98.1°F | Resp 15 | Ht 71.0 in | Wt 265.6 lb

## 2023-09-26 DIAGNOSIS — K7581 Nonalcoholic steatohepatitis (NASH): Secondary | ICD-10-CM

## 2023-09-26 DIAGNOSIS — R7989 Other specified abnormal findings of blood chemistry: Secondary | ICD-10-CM

## 2023-09-26 NOTE — Patient Instructions (Signed)
 Labs in 3 months  Follow in 1 year

## 2023-10-12 ENCOUNTER — Ambulatory Visit: Attending: Cardiovascular Disease

## 2023-10-12 ENCOUNTER — Encounter (INDEPENDENT_AMBULATORY_CARE_PROVIDER_SITE_OTHER): Payer: Self-pay | Admitting: Internal Medicine

## 2023-10-12 DIAGNOSIS — E7800 Pure hypercholesterolemia, unspecified: Secondary | ICD-10-CM | POA: Insufficient documentation

## 2023-10-12 DIAGNOSIS — I10 Essential (primary) hypertension: Secondary | ICD-10-CM | POA: Insufficient documentation

## 2023-10-12 DIAGNOSIS — G4733 Obstructive sleep apnea (adult) (pediatric): Secondary | ICD-10-CM | POA: Insufficient documentation

## 2023-10-12 DIAGNOSIS — E669 Obesity, unspecified: Secondary | ICD-10-CM | POA: Insufficient documentation

## 2023-10-12 LAB — COMPREHENSIVE METABOLIC PANEL
ALT: 64 U/L — ABNORMAL HIGH (ref <=55)
AST (SGOT): 33 U/L (ref <=41)
Albumin/Globulin Ratio: 2 (ref 0.9–2.2)
Albumin: 4.5 g/dL (ref 3.5–5.0)
Alkaline Phosphatase: 73 U/L (ref 37–117)
Anion Gap: 10 (ref 5.0–15.0)
BUN: 20 mg/dL (ref 9–28)
Bilirubin, Total: 0.9 mg/dL (ref 0.2–1.2)
CO2: 24 meq/L (ref 17–29)
Calcium: 9.2 mg/dL (ref 8.5–10.5)
Chloride: 104 meq/L (ref 99–111)
Creatinine: 1.2 mg/dL (ref 0.5–1.5)
GFR: >60 mL/min/1.73 m2 (ref >=60.0)
Globulin: 2.2 g/dL (ref 2.0–3.6)
Glucose: 97 mg/dL (ref 70–100)
Potassium: 4.1 meq/L (ref 3.5–5.3)
Protein, Total: 6.7 g/dL (ref 6.0–8.3)
Sodium: 138 meq/L (ref 135–145)

## 2023-10-12 LAB — LIPID PANEL
Cholesterol / HDL Ratio: 3.9 {index}
Cholesterol: 114 mg/dL (ref <=199)
HDL: 29 mg/dL — ABNORMAL LOW (ref >=40)
LDL Calculated: 63 mg/dL (ref 0–99)
Triglycerides: 108 mg/dL (ref 34–149)
VLDL Calculated: 22 mg/dL (ref 10–40)

## 2023-10-12 MED ORDER — ZEPBOUND 7.5 MG/0.5ML SC SOLN
7.5000 mg | SUBCUTANEOUS | 0 refills | Status: DC
Start: 2023-10-12 — End: 2023-10-12

## 2023-10-12 MED ORDER — TIRZEPATIDE-WEIGHT MANAGEMENT 7.5 MG/0.5ML SC SOAJ
7.5000 mg | SUBCUTANEOUS | 0 refills | Status: DC
Start: 2023-10-12 — End: 2023-10-12

## 2023-10-12 MED ORDER — TIRZEPATIDE-WEIGHT MANAGEMENT 7.5 MG/0.5ML SC SOAJ
7.5000 mg | SUBCUTANEOUS | 0 refills | Status: DC
Start: 2023-10-12 — End: 2023-11-10

## 2023-10-12 NOTE — Progress Notes (Signed)
 Patient would prefer to use autoinjector.  [place] of Zepbound  7.5 mg sent to local pharmacy.

## 2023-10-12 NOTE — Addendum Note (Signed)
 Addended by: Maeryn Mcgath Y. on: 10/12/2023 02:48 PM     Modules accepted: Orders

## 2023-10-12 NOTE — Progress Notes (Signed)
 Increase dose of Zepbound  from 5 mg to 7.5 mg.  Patient would like to use vial through the [facility] direct pharmacy for savings.  Understands the additional steps required to use a vial.

## 2023-10-19 ENCOUNTER — Ambulatory Visit: Payer: Self-pay | Admitting: Internal Medicine

## 2023-11-10 MED ORDER — TIRZEPATIDE-WEIGHT MANAGEMENT 7.5 MG/0.5ML SC SOAJ
7.5000 mg | SUBCUTANEOUS | 0 refills | Status: DC
Start: 2023-11-10 — End: 2023-11-29

## 2023-11-10 NOTE — Addendum Note (Signed)
 Addended by: Willem Klingensmith Y. on: 11/10/2023 11:24 AM     Modules accepted: Orders

## 2023-11-10 NOTE — Progress Notes (Signed)
 Refill of 7.5 sent to local pharmacy.  Patient is doing well with weight loss.  Is having some constipation is well-managed with medication.  Has remained active with golfing and walking.

## 2023-11-11 ENCOUNTER — Other Ambulatory Visit: Payer: Self-pay | Admitting: Internal Medicine

## 2023-11-11 DIAGNOSIS — G4733 Obstructive sleep apnea (adult) (pediatric): Secondary | ICD-10-CM

## 2023-11-11 DIAGNOSIS — I10 Essential (primary) hypertension: Secondary | ICD-10-CM

## 2023-11-11 DIAGNOSIS — E7800 Pure hypercholesterolemia, unspecified: Secondary | ICD-10-CM

## 2023-11-11 DIAGNOSIS — K760 Fatty (change of) liver, not elsewhere classified: Secondary | ICD-10-CM

## 2023-11-11 DIAGNOSIS — E669 Obesity, unspecified: Secondary | ICD-10-CM

## 2023-11-14 NOTE — Telephone Encounter (Signed)
 PA already done in February 2025. Zepbound  is a plan exclusion.

## 2023-11-14 NOTE — Progress Notes (Signed)
 PA was already done February 2025. Zepbound  is a plan exclusion.

## 2023-11-29 ENCOUNTER — Other Ambulatory Visit: Payer: Self-pay | Admitting: Internal Medicine

## 2023-11-29 DIAGNOSIS — K760 Fatty (change of) liver, not elsewhere classified: Secondary | ICD-10-CM

## 2023-11-29 DIAGNOSIS — G4733 Obstructive sleep apnea (adult) (pediatric): Secondary | ICD-10-CM

## 2023-11-29 DIAGNOSIS — E669 Obesity, unspecified: Secondary | ICD-10-CM

## 2023-11-29 MED ORDER — ZEPBOUND 7.5 MG/0.5ML SC SOLN
7.5000 mg | SUBCUTANEOUS | 0 refills | Status: DC
Start: 2023-11-29 — End: 2023-12-23

## 2023-11-29 MED ORDER — ZEPBOUND 7.5 MG/0.5ML SC SOLN
7.5000 mg | SUBCUTANEOUS | 0 refills | Status: DC
Start: 2023-11-29 — End: 2023-11-29

## 2023-11-29 NOTE — Addendum Note (Signed)
 Addended by: Prapti Grussing Y. on: 11/29/2023 03:23 PM     Modules accepted: Orders

## 2023-11-29 NOTE — Addendum Note (Signed)
 Addended by: Leeann Bady Y. on: 11/29/2023 03:21 PM     Modules accepted: Orders

## 2023-11-29 NOTE — Telephone Encounter (Signed)
 Converting patient to vials of Zepbound  at 7.5 mg which patient has been doing well.  1 month supply provided

## 2023-12-06 ENCOUNTER — Encounter: Payer: Self-pay | Admitting: Internal Medicine

## 2023-12-16 ENCOUNTER — Ambulatory Visit (INDEPENDENT_AMBULATORY_CARE_PROVIDER_SITE_OTHER): Payer: No Typology Code available for payment source | Admitting: Internal Medicine

## 2023-12-17 ENCOUNTER — Other Ambulatory Visit (INDEPENDENT_AMBULATORY_CARE_PROVIDER_SITE_OTHER): Payer: Self-pay | Admitting: Cardiovascular Disease

## 2023-12-17 DIAGNOSIS — I10 Essential (primary) hypertension: Secondary | ICD-10-CM

## 2023-12-22 ENCOUNTER — Other Ambulatory Visit: Payer: Self-pay | Admitting: Internal Medicine

## 2023-12-22 DIAGNOSIS — G4733 Obstructive sleep apnea (adult) (pediatric): Secondary | ICD-10-CM

## 2023-12-22 DIAGNOSIS — E669 Obesity, unspecified: Secondary | ICD-10-CM

## 2023-12-22 DIAGNOSIS — K760 Fatty (change of) liver, not elsewhere classified: Secondary | ICD-10-CM

## 2024-01-20 ENCOUNTER — Other Ambulatory Visit: Payer: Self-pay | Admitting: Internal Medicine

## 2024-01-20 ENCOUNTER — Encounter: Payer: Self-pay | Admitting: Internal Medicine

## 2024-01-20 DIAGNOSIS — G4733 Obstructive sleep apnea (adult) (pediatric): Secondary | ICD-10-CM

## 2024-01-20 DIAGNOSIS — I10 Essential (primary) hypertension: Secondary | ICD-10-CM

## 2024-01-20 DIAGNOSIS — E7800 Pure hypercholesterolemia, unspecified: Secondary | ICD-10-CM

## 2024-01-20 DIAGNOSIS — K760 Fatty (change of) liver, not elsewhere classified: Secondary | ICD-10-CM

## 2024-01-20 DIAGNOSIS — E669 Obesity, unspecified: Secondary | ICD-10-CM

## 2024-01-20 DIAGNOSIS — R911 Solitary pulmonary nodule: Secondary | ICD-10-CM

## 2024-01-20 DIAGNOSIS — E559 Vitamin D deficiency, unspecified: Secondary | ICD-10-CM

## 2024-01-23 ENCOUNTER — Encounter: Payer: Self-pay | Admitting: Internal Medicine

## 2024-01-23 ENCOUNTER — Telehealth: Payer: Self-pay | Admitting: Internal Medicine

## 2024-01-23 DIAGNOSIS — Z87891 Personal history of nicotine dependence: Secondary | ICD-10-CM

## 2024-01-23 DIAGNOSIS — R911 Solitary pulmonary nodule: Secondary | ICD-10-CM

## 2024-01-23 NOTE — Telephone Encounter (Signed)
 Guy Martinez called trying to schedule the CT Scan Lung and has been informed insurance won't cover unless w/o contrast or if has diagnosis or prior smoking.  Please advise what can be done to get covered.     Thank you

## 2024-01-24 ENCOUNTER — Encounter: Payer: Self-pay | Admitting: Internal Medicine

## 2024-01-24 DIAGNOSIS — K760 Fatty (change of) liver, not elsewhere classified: Secondary | ICD-10-CM

## 2024-01-24 DIAGNOSIS — E669 Obesity, unspecified: Secondary | ICD-10-CM

## 2024-01-24 DIAGNOSIS — G4733 Obstructive sleep apnea (adult) (pediatric): Secondary | ICD-10-CM

## 2024-01-25 ENCOUNTER — Encounter: Payer: Self-pay | Admitting: Internal Medicine

## 2024-01-25 DIAGNOSIS — R051 Acute cough: Secondary | ICD-10-CM

## 2024-01-25 MED ORDER — ZEPBOUND 7.5 MG/0.5ML SC SOLN
7.5000 mg | SUBCUTANEOUS | 0 refills | Status: DC
Start: 2024-01-25 — End: 2024-01-26

## 2024-01-25 MED ORDER — AZITHROMYCIN 250 MG PO TABS
ORAL_TABLET | ORAL | 0 refills | Status: AC
Start: 2024-01-25 — End: 2024-01-31

## 2024-01-25 NOTE — Progress Notes (Signed)
 Called in Z-Pak for suspected postviral bacterial upper respiratory infection.

## 2024-01-26 MED ORDER — ZEPBOUND 7.5 MG/0.5ML SC SOLN
7.5000 mg | SUBCUTANEOUS | 0 refills | Status: DC
Start: 2024-01-26 — End: 2024-02-28

## 2024-01-26 NOTE — Addendum Note (Signed)
 Addended by: Karel Turpen Y. on: 01/26/2024 10:47 AM     Modules accepted: Orders

## 2024-02-01 ENCOUNTER — Ambulatory Visit: Admission: RE | Admit: 2024-02-01 | Source: Ambulatory Visit

## 2024-02-22 ENCOUNTER — Ambulatory Visit: Attending: Internal Medicine

## 2024-02-22 ENCOUNTER — Ambulatory Visit: Payer: Self-pay | Admitting: Internal Medicine

## 2024-02-22 DIAGNOSIS — I10 Essential (primary) hypertension: Secondary | ICD-10-CM | POA: Insufficient documentation

## 2024-02-22 DIAGNOSIS — E7800 Pure hypercholesterolemia, unspecified: Secondary | ICD-10-CM | POA: Insufficient documentation

## 2024-02-22 DIAGNOSIS — E781 Pure hyperglyceridemia: Secondary | ICD-10-CM | POA: Insufficient documentation

## 2024-02-22 DIAGNOSIS — E559 Vitamin D deficiency, unspecified: Secondary | ICD-10-CM | POA: Insufficient documentation

## 2024-02-22 LAB — FERRITIN: Ferritin: 311 ng/mL — ABNORMAL HIGH (ref 21.80–274.70)

## 2024-02-22 LAB — COMPREHENSIVE METABOLIC PANEL
ALT: 60 U/L — ABNORMAL HIGH (ref <=55)
AST (SGOT): 36 U/L (ref <=41)
Albumin/Globulin Ratio: 2.4 — ABNORMAL HIGH (ref 0.9–2.2)
Albumin: 4.7 g/dL (ref 3.8–5.0)
Alkaline Phosphatase: 65 U/L (ref 37–117)
Anion Gap: 6 (ref 5.0–15.0)
BUN: 20 mg/dL (ref 9–28)
Bilirubin, Total: 1 mg/dL (ref 0.2–1.2)
CO2: 28 meq/L (ref 17–29)
Calcium: 9.5 mg/dL (ref 8.5–10.5)
Chloride: 106 meq/L (ref 99–111)
Creatinine: 1.1 mg/dL (ref 0.5–1.5)
GFR: >60 mL/min/1.73 m2 (ref >=60.0)
Globulin: 2 g/dL (ref 2.0–3.6)
Glucose: 90 mg/dL (ref 70–100)
Potassium: 4.2 meq/L (ref 3.5–5.3)
Protein, Total: 6.7 g/dL (ref 6.0–8.3)
Sodium: 140 meq/L (ref 135–145)

## 2024-02-22 LAB — LAB USE ONLY - CBC WITH DIFFERENTIAL
Absolute Basophils: 0.04 x10 3/uL (ref 0.00–0.08)
Absolute Eosinophils: 0.1 x10 3/uL (ref 0.00–0.44)
Absolute Immature Granulocytes: 0.02 x10 3/uL (ref 0.00–0.07)
Absolute Lymphocytes: 2.06 x10 3/uL (ref 0.42–3.22)
Absolute Monocytes: 0.35 x10 3/uL (ref 0.21–0.85)
Absolute Neutrophils: 3.26 x10 3/uL (ref 1.10–6.33)
Absolute nRBC: 0 x10 3/uL (ref <=0.00)
Basophils %: 0.7 %
Eosinophils %: 1.7 %
Hematocrit: 41.7 % (ref 37.6–49.6)
Hemoglobin: 14.1 g/dL (ref 12.5–17.1)
Immature Granulocytes %: 0.3 %
Lymphocytes %: 35.3 %
MCH: 29.9 pg (ref 25.1–33.5)
MCHC: 33.8 g/dL (ref 31.5–35.8)
MCV: 88.3 fL (ref 78.0–96.0)
MPV: 9.3 fL (ref 8.9–12.5)
Monocytes %: 6 %
Neutrophils %: 56 %
Platelet Count: 216 x10 3/uL (ref 142–346)
Preliminary Absolute Neutrophil Count: 3.26 x10 3/uL (ref 1.10–6.33)
RBC: 4.72 x10 6/uL (ref 4.20–5.90)
RDW: 13 % (ref 11–15)
WBC: 5.83 x10 3/uL (ref 3.10–9.50)
nRBC %: 0 /100{WBCs} (ref <=0.0)

## 2024-02-22 LAB — URINALYSIS WITH REFLEX TO MICROSCOPIC EXAM - REFLEX TO CULTURE
Urine Bilirubin: NEGATIVE
Urine Blood: NEGATIVE
Urine Glucose: NEGATIVE
Urine Ketones: NEGATIVE mg/dL
Urine Leukocyte Esterase: NEGATIVE
Urine Nitrite: NEGATIVE
Urine Protein: NEGATIVE
Urine Specific Gravity: 1.027 (ref 1.001–1.035)
Urine Urobilinogen: NORMAL mg/dL (ref 0.2–2.0)
Urine pH: 6.5 (ref 5.0–8.0)

## 2024-02-22 LAB — LIPID PANEL
Cholesterol / HDL Ratio: 3.6 {index}
Cholesterol: 123 mg/dL (ref <=199)
HDL: 34 mg/dL — ABNORMAL LOW (ref >=40)
LDL Calculated: 70 mg/dL (ref 0–99)
Triglycerides: 97 mg/dL (ref 34–149)
VLDL Calculated: 19 mg/dL (ref 10–40)

## 2024-02-22 LAB — IRON PROFILE
Iron Saturation: 22 % (ref 15–50)
Iron: 68 ug/dL (ref 41–168)
TIBC: 314 ug/dL (ref 261–462)
UIBC: 246 ug/dL (ref 126–382)

## 2024-02-22 LAB — TSH: TSH: 2.44 u[IU]/mL (ref 0.35–4.94)

## 2024-02-22 LAB — VITAMIN D, 25 OH, TOTAL: Vitamin D 25-OH, Total: 50 ng/mL (ref 30–100)

## 2024-02-22 LAB — PSA TOTAL, ANNUAL SCREENING: Prostate Specific Antigen, Total: 0.99 ng/mL (ref 0.000–4.000)

## 2024-02-22 LAB — URIC ACID: Uric Acid: 5.7 mg/dL (ref 3.6–9.7)

## 2024-02-28 ENCOUNTER — Encounter: Payer: Self-pay | Admitting: Internal Medicine

## 2024-02-28 ENCOUNTER — Ambulatory Visit (INDEPENDENT_AMBULATORY_CARE_PROVIDER_SITE_OTHER): Payer: No Typology Code available for payment source | Admitting: Internal Medicine

## 2024-02-28 VITALS — BP 125/80 | HR 67 | Temp 98.0°F | Resp 12 | Ht 71.0 in | Wt 241.6 lb

## 2024-02-28 DIAGNOSIS — K760 Fatty (change of) liver, not elsewhere classified: Secondary | ICD-10-CM

## 2024-02-28 DIAGNOSIS — I10 Essential (primary) hypertension: Secondary | ICD-10-CM

## 2024-02-28 DIAGNOSIS — Z0000 Encounter for general adult medical examination without abnormal findings: Secondary | ICD-10-CM

## 2024-02-28 DIAGNOSIS — D225 Melanocytic nevi of trunk: Secondary | ICD-10-CM

## 2024-02-28 DIAGNOSIS — M75101 Unspecified rotator cuff tear or rupture of right shoulder, not specified as traumatic: Secondary | ICD-10-CM

## 2024-02-28 DIAGNOSIS — E7800 Pure hypercholesterolemia, unspecified: Secondary | ICD-10-CM

## 2024-02-28 DIAGNOSIS — R911 Solitary pulmonary nodule: Secondary | ICD-10-CM

## 2024-02-28 DIAGNOSIS — E559 Vitamin D deficiency, unspecified: Secondary | ICD-10-CM

## 2024-02-28 DIAGNOSIS — G4733 Obstructive sleep apnea (adult) (pediatric): Secondary | ICD-10-CM

## 2024-02-28 MED ORDER — ZEPBOUND 10 MG/0.5ML SC SOLN
10.0000 mg | SUBCUTANEOUS | 0 refills | Status: DC
Start: 2024-02-28 — End: 2024-03-22

## 2024-02-28 NOTE — Progress Notes (Signed)
 Guy Martinez is a 52 y.o. male presents today for annual physical    Chief Complaint   Patient presents with    Annual Exam     Cards/Sleep: [facility]. Last visit 07/2023 with follow up due 01/2024  - HTN - Losartan -hydrochlorothiazide 100/25 mg daily.  110-130s/60-80s.  - HLD - Atorvastatin  40 mg  - Family history of early cardiac disease - Evaluated at [facility] heart for non-obstructive disease.   - OSA - on CPAP     [facility] [place]. Last visit 08/2023 with follow up 08/2024.  Mild Hepatic Steatosis - Stable labs.     FEN:  Low Vitamin D  -has not been regularly taking his vitamin D  supplementation  Obesity - On zepbound  7.5mg . Has last about 50lbs.     [facility]  Left Shoulder Repair - S/p left shoulder arthroscopy  07/2022 with good results.  Some right shoulder pain and considering seeing sports med     Derm: [facility]  Numerous moles - 6 mo follow up     Pulm:  Lung Nodule - Due for repeat CT screen.    Health Maintenance  Colonoscopy: 2023 with Dr. Erika. Repeat due 2028.  Testicular Exams: Done regularly  Derm: [facility]  Dental: Annually     Surgical History: Reviewed and noted below  Family History: Reviewed and noted below. Father with history of early heart attack.  Social Hisory: Reviewed and noted below     Family: Lives with wife and four kids. 22 (working and living at home), 20 (Football at [employer]), 19 (Ole Miss), 17 Environmental manager [employer]). Rottie mix and another rescue.  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   Stress: Low. Good oulets and support network among colleagues and friends.  Depression Screening: Negative  Hobbies: Golf in the 7.8 @ [employer]. Kids.  Exercise: Golf daily (ride and walk). No dedicated exercise.  Diet: Focusing on protein intake. Increased vegetable and fruit intake.  Sleep: 7 hrs/night. No difficulty falling or staying asleep. Might wake up earlier occasionally. Using cpap. Well rested in the AM and  no daytime somnolence.    HISTORY     Problem List[1]    Medical History[2]    Medications Ordered Prior to Encounter[3]    Allergies[4]    Past Surgical History[5]    Family History[6]    Social History[7]      REVIEW OF SYSTEMS     Review of Systems   Constitutional:  Negative for chills, fever, malaise/fatigue and weight loss.   HENT:  Negative for congestion, ear discharge, ear pain, hearing loss, sinus pain and sore throat.    Eyes:  Negative for blurred vision, double vision, pain and discharge.   Respiratory:  Negative for cough, shortness of breath and wheezing.    Cardiovascular:  Negative for chest pain, palpitations, leg swelling and PND.   Gastrointestinal:  Negative for abdominal pain, blood in stool, constipation, diarrhea, nausea and vomiting.   Genitourinary:  Negative for dysuria, frequency and hematuria.   Musculoskeletal:  Negative for back pain, joint pain, myalgias and neck pain.   Skin:  Negative for rash.   Neurological:  Negative for dizziness, focal weakness, loss of consciousness, weakness and headaches.   Psychiatric/Behavioral:  The patient does not have insomnia.          PHYSICAL EXAM     Vital Signs (most recent): BP 125/80 (BP Site: Right  arm, Patient Position: Sitting, Cuff Size: Medium)   Pulse 67   Temp 98 F (36.7 C) (Oral)   Resp 12   Ht 1.803 m (5' 11)   Wt 109.6 kg (241 lb 9.6 oz)   SpO2 98%   BMI 33.70 kg/m     Physical Exam  Vitals reviewed.   Constitutional:       General: He is not in acute distress.     Appearance: Normal appearance. He is normal weight. He is not ill-appearing, toxic-appearing or diaphoretic.   HENT:      Head: Normocephalic and atraumatic.      Right Ear: Tympanic membrane, ear canal and external ear normal. There is no impacted cerumen.      Left Ear: Tympanic membrane, ear canal and external ear normal. There is no impacted cerumen.      Nose: Nose normal. No congestion or rhinorrhea.      Mouth/Throat:      Mouth: Mucous membranes are moist.       Pharynx: Oropharynx is clear. No oropharyngeal exudate or posterior oropharyngeal erythema.   Eyes:      General: No scleral icterus.        Right eye: No discharge.         Left eye: No discharge.      Extraocular Movements: Extraocular movements intact.      Conjunctiva/sclera: Conjunctivae normal.      Pupils: Pupils are equal, round, and reactive to light.   Neck:      Vascular: No carotid bruit.   Cardiovascular:      Rate and Rhythm: Normal rate and regular rhythm.      Pulses: Normal pulses.      Heart sounds: Normal heart sounds. No murmur heard.     No friction rub. No gallop.   Pulmonary:      Effort: Pulmonary effort is normal. No respiratory distress.      Breath sounds: Normal breath sounds. No stridor. No wheezing, rhonchi or rales.   Chest:      Chest wall: No tenderness.   Abdominal:      General: Abdomen is flat. Bowel sounds are normal. There is no distension.      Palpations: Abdomen is soft. There is no mass.      Tenderness: There is no abdominal tenderness. There is no right CVA tenderness, left CVA tenderness, guarding or rebound.      Hernia: No hernia is present.   Genitourinary:     Penis: Normal.       Testes: Normal.   Musculoskeletal:         General: No swelling, tenderness, deformity or signs of injury. Normal range of motion.      Cervical back: Normal range of motion and neck supple. No rigidity or tenderness.      Right lower leg: No edema.      Left lower leg: No edema.   Lymphadenopathy:      Cervical: No cervical adenopathy.   Skin:     General: Skin is warm and dry.      Capillary Refill: Capillary refill takes less than 2 seconds.      Coloration: Skin is not jaundiced or pale.      Findings: No bruising, erythema, lesion or rash.      Comments: Rash on left lateral malleolus.   Neurological:      General: No focal deficit present.      Mental Status: He is alert  and oriented to person, place, and time. Mental status is at baseline.      Cranial Nerves: No cranial nerve  deficit.      Sensory: No sensory deficit.      Motor: No weakness.      Coordination: Coordination normal.      Gait: Gait normal.      Deep Tendon Reflexes: Reflexes normal.   Psychiatric:         Mood and Affect: Mood normal.         Behavior: Behavior normal.         Thought Content: Thought content normal.         Judgment: Judgment normal.          ASSESSMENT & PLAN     1. Annual physical exam  ECG 12 lead (No Charge)      2. Morbid obesity (CMS/HCC)  tirzepatide  (Zepbound ) 10 MG/0.5ML injection vial      3. Primary hypertension        4. Pure hypercholesterolemia        5. OSA (obstructive sleep apnea)        6. Lung nodule        7. Hepatic steatosis        8. Vitamin D  deficiency        9. Melanocytic nevi of trunk        10. Tear of right rotator cuff, unspecified tear extent, unspecified whether traumatic            Cards/Sleep: [facility]  HTN -blood pressure is borderline on current dose of losartan /Sotacor thiazide.  Discussed increasing cardiovascular exercise to help reduce this number  HLD -LDL at 70.  Continue current dose.  Family history of early cardiac disease - Evaluated at [facility] for non-obstructive disease.   OSA - on CPAP     FEN:  Low Vitamin D  -normal vitamin D  levels.  Continue current supplementation  Obesity -uptitrating Zepbound  from 7.5 mg to 10 mg.  Discussed importance of dietary modifications along with regular exercise.  Patient unsure if he is using the vials correctly and offered coming in for a nursing visit to make sure that administration is done correctly.     Ortho: [place]  Shoulder pain.  Left shoulder is doing well after arthroscopy.  Has plans to follow-up again with a nova for right shoulder pain     Derm:  Numerous moles - annual follow up  Rash on left lateral malleolus - Requesting notes from derm.     Pulm:  Lung Nodule - Overdue for repeat CT     Health Maintenance  - COVID up to date  - Flu shot up to date  - Shingles over due  - Pneumonia Vaccine: due  - Tdap: up  to date  - Hep C: negative  - Colonoscopy Repeat due 2028  - Dexa at 53 yo  - LDCT: See above  - AAA: US  at 53 yo  - PSA normal  - Derm annual visits    EKG: Normal Sinus Rhythm. Normal axis. Normal intervals. No ST elevations, ST depression, q waves, or inverted T waves appreciated.    Office follow up in 12 months for repeat physical or sooner as needed.    Signed,  Ozell CINDERELLA Cramp, MD 02/28/2024 11:32 AM             [1]   Patient Active Problem List  Diagnosis    HLD (hyperlipidemia)  Morbid obesity (CMS/HCC)    HTN (hypertension)    Disorder of hair follicle    Melanocytic nevi of trunk    Tinea pedis    Seborrheic keratosis    Vitamin D  deficiency    OSA (obstructive sleep apnea)    Lung nodule    Tear of right rotator cuff, unspecified tear extent, unspecified whether traumatic    Hepatic steatosis   [2]   Past Medical History:  Diagnosis Date    Actinic keratosis 03/19/2020    H/O Coronary CT Angiogram 10/2010    H/O echocardiogram 10/2014    H/O stress echocardiogram 02/2009    normal    H/O treadmill stress test 01/2008, 05/2010, 11/2015, 12/12/15    Hyperlipidemia     MANAGED WITH MEDS.    Hypertension     controlled w/med per pt    Sebaceous cyst 03/19/2020    Sleep apnea     +OSA - USES CPAP NIGHTLY.   [3]   Current Outpatient Medications on File Prior to Visit   Medication Sig Dispense Refill    atorvastatin  (LIPITOR) 40 MG tablet TAKE 1 TABLET BY MOUTH EVERY DAY 90 tablet 3    BIOTIN PO Take by mouth      losartan -hydrochlorothiazide (HYZAAR) 100-25 MG per tablet TAKE 1 TABLET BY MOUTH EVERY DAY 90 tablet 3    [DISCONTINUED] tirzepatide  (Zepbound ) 7.5 MG/0.5ML injection vial Inject 0.5 mLs (7.5 mg) into the skin once a week 2 mL 0     No current facility-administered medications on file prior to visit.   [4] No Known Allergies  [5]   Past Surgical History:  Procedure Laterality Date    COLONOSCOPY, DIAGNOSTIC (SCREENING) N/A 12/03/2021    Repeat 5 years. Tubular Adenoma;  Surgeon: Erika Liza RAMAN, MD;   Location: [facility];  Service: Gastroenterology;    rotator cuff surgery Left 07/01/2022    WISDOM TOOTH EXTRACTION     [6]   Family History  Problem Relation Name Age of Onset    Diverticulitis Mother      Cancer Father Trayvon Trumbull 70        Prostate, Bladder    Heart attack Father Pepe Mineau 45        4 MI's    Stroke Father Earsel Shouse 55        2 strokes    Hypertension Father Colum Colt     Diabetes Father Damontae Loppnow     Hyperlipidemia Father Dreden Rivere     Heart disease Father Fredis Malkiewicz     Myocardial Infarction Father Elyan Vanwieren     No known problems Sister      No known problems Brother      No known problems Paternal Grandmother      COPD Paternal Grandfather Montae Stager     Heart attack Paternal Grandfather Ashir Kunz     Diabetes Paternal Grandfather Jaikob Borgwardt     Heart disease Paternal Grandparent      Diabetes Paternal Grandparent      Emphysema Paternal Grandparent          pulmonary    No known problems Son Signe     Appendicitis Son Signa     Asthma Son Joanell     No known problems Son Shyrl     Pancreatic cancer Paternal Uncle  13   [7]   Social History  Tobacco Use    Smoking status: Former     Current packs/day: 0.00  Average packs/day: 0.3 packs/day for 2.0 years (0.5 ttl pk-yrs)     Types: Cigarettes     Start date: 03/04/2009     Quit date: 03/05/2011     Years since quitting: 12.9    Smokeless tobacco: Former     Types: Snuff     Quit date: 05/31/2012    Tobacco comments:     Social use. 5-7 cig/week   Vaping Use    Vaping status: Never Used   Substance Use Topics    Alcohol use: Yes     Alcohol/week: 13.0 standard drinks of alcohol     Types: 6 Cans of beer, 7 Drinks containing 0.5 oz of alcohol per week     Comment: Moderate drinker    Drug use: Never

## 2024-02-28 NOTE — Progress Notes (Signed)
[] :   Deferred per patient.    []  : Deferred per patient.    []  : Deferred per patient.    []  : Unable to perform. Equipment unavailable.    [x]  EKG: Test performed and results discussed with physician.    []  :  None    [x]  Blood work: Performed during previous visit.              [x]  Updated patient's chart:  Allergies  Medications  History  Depression Screening  Health Maintenance  Immunizations  Vital signs  Pharmacy  Advanced Directive    [x]  Advanced Directive: Discussed Advanced Directive with patient today. Patient currently does not have an Advanced Directive. Provided patient with the [place]  Advanced Directive for Healthcare Providers form. Patient will provide a copy to be scanned into chart once completed.                Patient seen by Dr. Luke for physical exam.

## 2024-03-12 ENCOUNTER — Encounter (INDEPENDENT_AMBULATORY_CARE_PROVIDER_SITE_OTHER): Payer: Self-pay

## 2024-03-13 ENCOUNTER — Encounter: Payer: Self-pay | Admitting: Internal Medicine

## 2024-03-21 ENCOUNTER — Encounter (INDEPENDENT_AMBULATORY_CARE_PROVIDER_SITE_OTHER): Payer: Self-pay | Admitting: Cardiovascular Disease

## 2024-03-21 ENCOUNTER — Ambulatory Visit (INDEPENDENT_AMBULATORY_CARE_PROVIDER_SITE_OTHER): Admitting: Cardiovascular Disease

## 2024-03-21 DIAGNOSIS — E7800 Pure hypercholesterolemia, unspecified: Secondary | ICD-10-CM

## 2024-03-21 DIAGNOSIS — E782 Mixed hyperlipidemia: Secondary | ICD-10-CM

## 2024-03-21 LAB — ECG 12-LEAD, NO CHARGE
Atrial Rate: 62 {beats}/min
IHS MUSE NARRATIVE AND IMPRESSION: NORMAL
P Axis: -5 degrees
P-R Interval: 192 ms
Q-T Interval: 398 ms
QRS Duration: 98 ms
QTC Calculation (Bezet): 403 ms
R Axis: 41 degrees
T Axis: 36 degrees
Ventricular Rate: 62 {beats}/min

## 2024-03-21 NOTE — Progress Notes (Signed)
 [place]  HEART CARDIOLOGY OFFICE PROGRESS NOTE    [facility] OFFICE  [place]  HEART [facility] OFFICE -CARDIOLOGY  [address]  [place] [address]  Dept: [phone]  Dept Fax: [phone]       Patient Name: Guy Martinez    Date of Visit:  March 21, 2024  Date of Birth: [birth year]  AGE: 53 y.o.  Medical Record #: [id number]  Requesting Physician: Guy CINDERELLA Cramp, MD      CHIEF COMPLAINT: Pure hypercholesterolemia      HISTORY OF PRESENT ILLNESS:    He is a pleasant 53 y.o. male who presents today for routine follow-up.    He is really doing well since our last visit.  He has lost about 60 pounds.  He is playing 36 holes of golf daily.  His blood pressure and cholesterol are under excellent control.  Liver markers are improving.  Carotid ultrasound pending.    PAST MEDICAL HISTORY: He has a past medical history of Actinic keratosis (03/19/2020), H/O Coronary CT Angiogram (10/2010), H/O echocardiogram (10/2014), H/O stress echocardiogram (02/2009), H/O treadmill stress test (01/2008, 05/2010, 11/2015, 12/12/15), Hyperlipidemia, Hypertension, Sebaceous cyst (03/19/2020), and Sleep apnea. He has a past surgical history that includes Wisdom tooth extraction; COLONOSCOPY, DIAGNOSTIC (SCREENING) (N/A, 12/03/2021); and rotator cuff surgery (Left, 07/01/2022).    Allergies[1]    MEDICATIONS:   Patient's current medications were reviewed. ONLY Cardiac medications were updated unless others were addressed in assessment and plan.    Current Outpatient Medications   Medication Instructions    atorvastatin  (LIPITOR) 40 mg, Oral, Daily    BIOTIN PO Take by mouth    losartan -hydrochlorothiazide (HYZAAR) 100-25 MG per tablet 1 tablet, Oral, Daily    Zepbound  10 mg, Subcutaneous, weekly       FAMILY HISTORY: family history includes Appendicitis in his son; Asthma in his son; COPD in his paternal grandfather; Cancer (age of onset: 33) in his father; Diabetes in his father, paternal grandfather, and paternal  grandparent; Diverticulitis in his mother; Emphysema in his paternal grandparent; Heart attack in his paternal grandfather; Heart attack (age of onset: 56) in his father; Heart disease in his father and paternal grandparent; Hyperlipidemia in his father; Hypertension in his father; Myocardial Infarction in his father; No known problems in his brother, paternal grandmother, sister, son, and son; Pancreatic cancer (age of onset: 59) in his paternal uncle; Stroke (age of onset: 58) in his father.    SOCIAL HISTORY: He reports that he quit smoking about 13 years ago. His smoking use included cigarettes. He started smoking about 15 years ago. He has a 0.5 pack-year smoking history. He quit smokeless tobacco use about 11 years ago.  His smokeless tobacco use included snuff. He reports current alcohol use of about 13.0 standard drinks of alcohol per week. He reports that he does not use drugs.    PHYSICAL EXAMINATION    Visit Vitals  BP 124/82 (BP Site: Left arm, Patient Position: Sitting, Cuff Size: Large)   Pulse 73   Ht 1.803 m (5' 11)   Wt 105.4 kg (232 lb 6.4 oz)   SpO2 98%   BMI 32.41 kg/m       Constitutional: Cooperative, alert, no acute distress.  Neck: No carotid bruits, JVP normal.  Cardiac: Regular rate and rhythm, normal S1 and S2; no S3 or S4. No murmurs. No rubs, no gallops.  Pulmonary: Clear to auscultation bilaterally, no wheezing, no rhonchi, no rales.  Abdomen: soft, non-distended, non-TTP  Extremities: no edema.  Vascular: +2 pulses in radial artery bilaterally, 2+ pedal pulses bilaterally.    LABS REVIEWED:   Lab Results   Component Value Date    WBC 5.83 02/22/2024    HGB 14.1 02/22/2024    HCT 41.7 02/22/2024    PLT 216 02/22/2024     Lab Results   Component Value Date    GLU 90 02/22/2024    BUN 20 02/22/2024    CREAT 1.1 02/22/2024    NA 140 02/22/2024    K 4.2 02/22/2024    CL 106 02/22/2024    CO2 28 02/22/2024    AST 36 02/22/2024    ALT 60 (H) 02/22/2024     Lab Results   Component Value Date     MG 2.2 08/10/2017    TSH 2.44 02/22/2024    HGBA1C 5.1 02/16/2023    BNP 15.9 08/10/2017     Lab Results   Component Value Date    CHOL 123 02/22/2024    TRIG 97 02/22/2024    HDL 34 (L) 02/22/2024    LDL 70 02/22/2024       IMPRESSION:   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 CAC 126, 92nd percentile 12/2019.  Asymptomatic carotid atherosclerosis less than 50% bilateral in 03/2022  Hypertension-adequately controlled today.  Continue current pharmacotherapy  Hyperlipidemia-on atorvastatin  40 mg with lipids at goal  Family history of coronary disease-father had his first event in his 38s; Lp(a) 7 (2023)  Normal ETT in 2021.  Body mass index is 32.41 kg/m.  Sleep apnea on CPAP      RECOMMENDATIONS:  Continue current cardiac medications.  Atorvastatin  40 mg daily  Losartan -hydrochlorothiazide 100/25 mg daily  Impressive weight loss, continue GLP-1 agonist  Heart of the diet and routine exercise  Follow-up in 12 months                                                 Orders Placed This Encounter   Procedures    Office Visit ([facility])     SIGNED:    Camellia JONETTA Law, MD         [1] No Known Allergies

## 2024-03-21 NOTE — Patient Instructions (Addendum)
 ______________________________________________________  ______________________________________________________    Mediterranean Diet    This diet can be simple to follow. See below for modified adherence screener  One point for each category below. Goal is 12-13 points.    Use olive oil as the principal source of fat for cooking/eating  Consume 4 or more tablespoons of olive oil daily. Including frying, salads, meats, etc.  Consume 3 or more servings of vegetables daily  Consume 4 or more servings of fruit daily  On average, consume less than 1 serving of red meat, hamburger, sausages, ham, etc daily  On average, consume less than 1 serving of butter, margarine, cream daily (1 serving = 12 g)  On average, consume less than 1 serving of sugar-sweetened beverage daily  Consume 3 or more servings of legumes per week (beans, peas, lentils, etc)  Consume 3 or more servings of fish or shellfish per week  Consume 3 or more servings of nuts per week (1 serving = 30 g)  Consume no more than 2 commercial pastries (cookies, cakes, biscuits etc) per week  Consume more [place] and chicken instead of veal, pork, hamburger, sausage  Consume sofrito with vegetables at least twice weekly. Sofrito is a sauce made with tomato, onion, garlic simmered with olive oil.  ______________________________________________________  ______________________________________________________    If you would like individualized recommendations, I recommend:    Scherry Ran RDN, CSOWM  [email or web address]  Phone: [phone]  Fax: [phone]    Eula Flax with [facility]  [phone]  [phone]  [email or web address]  [email or web address]      ____________________________________________________  ____________________________________________________      Please visit [email or web address] for more information from the [employer] of Cardiology    EXERCISE RECOMMENDATIONS  For prevention of heart  disease        Muscle strengthening exercises involving all major muscle groups should also be performed 2 days per week a moderate or greater intensity.  For patients 65 years and older, exercises emphasizing functional balance and strength training at moderate intensity should be performed 3 days per week.  Adherence to the recommendations above have been shown to reduce cardiovascular disease by up to 40% and reduce death by up to 31%.    What about going above and beyond?  There is evidence that doing MORE exercise than the above recommendations will increase the benefits of exercise.  There is also evidence that including vigorous exercise rather than only moderate exercise (for example, High Intensity Interval Training) will lead to additional benefits in cardiovascular function    For those just getting started...  Patients who change from daily inactivity (no exercise) to 30 minutes of walking per day get the maximum amount of relative benefit.    The above was adapted from information included in the following publication:  Kristen Cardinal, et al. Exercise for Primary and Secondary Prevention of Cardiovascular Disease. J Am Coll Cardiol. 2022 Sep, 80 (11) 2841-3244.  [email or web address]

## 2024-03-22 ENCOUNTER — Other Ambulatory Visit: Payer: Self-pay | Admitting: Internal Medicine

## 2024-03-26 ENCOUNTER — Encounter (INDEPENDENT_AMBULATORY_CARE_PROVIDER_SITE_OTHER): Payer: Self-pay

## 2024-03-26 ENCOUNTER — Ambulatory Visit
Admission: RE | Admit: 2024-03-26 | Discharge: 2024-03-26 | Disposition: A | Discharge status: Home / Self Care | Source: Ambulatory Visit | Attending: Cardiovascular Disease | Admitting: Cardiovascular Disease

## 2024-03-26 DIAGNOSIS — I6523 Occlusion and stenosis of bilateral carotid arteries: Secondary | ICD-10-CM | POA: Insufficient documentation

## 2024-03-30 ENCOUNTER — Encounter: Payer: Self-pay | Admitting: Internal Medicine

## 2024-04-18 ENCOUNTER — Other Ambulatory Visit: Payer: Self-pay | Admitting: Geriatric Medicine

## 2024-05-02 ENCOUNTER — Encounter: Payer: Self-pay | Admitting: Internal Medicine

## 2024-05-15 ENCOUNTER — Other Ambulatory Visit: Payer: Self-pay | Admitting: Internal Medicine

## 2024-06-15 ENCOUNTER — Other Ambulatory Visit: Payer: Self-pay | Admitting: Internal Medicine

## 2024-06-29 ENCOUNTER — Encounter (INDEPENDENT_AMBULATORY_CARE_PROVIDER_SITE_OTHER): Payer: Self-pay | Admitting: Sleep Medicine

## 2024-06-29 ENCOUNTER — Ambulatory Visit (INDEPENDENT_AMBULATORY_CARE_PROVIDER_SITE_OTHER): Admitting: Sleep Medicine

## 2024-06-29 VITALS — BP 122/78 | HR 67 | Ht 71.0 in | Wt 234.0 lb

## 2024-06-29 DIAGNOSIS — G4733 Obstructive sleep apnea (adult) (pediatric): Secondary | ICD-10-CM

## 2024-06-29 NOTE — Progress Notes (Signed)
 [place]  HEART SLEEP OFFICE PROGRESS NOTE    [facility] [place]  [place]  HEART [place] OFF[facility]  [address]  [place] [address]  Dept: [phone]  Dept Fax: [phone]       Patient Name: Guy Martinez    Date of Visit:  June 29, 2024  Date of Birth: 11/21/1970  AGE: 54 y.o.  Medical Record #: [id number]      CHIEF COMPLAINT:    Chief Complaint   Patient presents with    Follow-up     annual        HISTORY OF PRESENT ILLNESS:    54 year old man known to me returns for annual check-in.  Last seen by Burnard Edison, PA back in October 2024.  To briefly review history of hypertension, hyperlipidemia, moderate OSA diagnosed in 2022 by HST AHI 27 O2 nadir 66% on PAP therapy since that time.  Most recent device set up through quality DME in April 2023 ResMed AirSense 10 unit.  He actually leaves this unit at his home in [place], has another device, an AirSense 11 paid for out-of-pocket in April 2024.  Historically excellent compliance.    30-day CR on ResMed AirSense 11 APAP unit continues to show excellent use 83% over 4 hours, 100% of days use APAP settings 4-16 P90 8 AHI 0.3    Continues to follow with cardiology OV note 03/21/2024 with Dr. Julio reviewed.  Sounds are overall doing well continues to be quite active blood pressure and cholesterol under excellent control advised annual follow-up in cardiology clinic    PAST MEDICAL HISTORY: He has a past medical history of Actinic keratosis (03/19/2020), H/O Coronary CT Angiogram (10/2010), H/O echocardiogram (10/2014), H/O stress echocardiogram (02/2009), H/O treadmill stress test (01/2008, 05/2010, 11/2015, 12/12/15), Hyperlipidemia, Hypertension, Pulmonary hypertension (CMS/HCC), Sebaceous cyst (03/19/2020), and Sleep apnea. He has a past surgical history that includes Wisdom tooth extraction; COLONOSCOPY, DIAGNOSTIC (SCREENING) (N/A, 12/03/2021); and rotator cuff surgery (Left, 07/01/2022).    ALLERGIES:  Allergies[1]    MEDICATIONS:   Current Outpatient Medications   Medication Instructions    atorvastatin  (LIPITOR) 40 mg, Oral, Daily    BIOTIN PO Take by mouth    losartan -hydrochlorothiazide (HYZAAR) 100-25 MG per tablet 1 tablet, Oral, Daily    Zepbound  10 MG/0.5ML injection vial INJECT 0.5 ML (10 MG) UNDER THE SKIN ONCE WEEKLY (0.5ML= 50 UNITS)         FAMILY HISTORY: family history includes Appendicitis in his son; Asthma in his son; COPD in his paternal grandfather; Cancer (age of onset: 53) in his father; Diabetes in his father, paternal grandfather, and paternal grandparent; Diverticulitis in his mother; Emphysema in his paternal grandparent; Heart attack in his paternal grandfather; Heart attack (age of onset: 76) in his father; Heart disease in his father and paternal grandparent; Hyperlipidemia in his father; Hypertension in his father; Myocardial Infarction in his father; No known problems in his brother, paternal grandmother, sister, son, and son; Pancreatic cancer (age of onset: 33) in his paternal uncle; Stroke (age of onset: 50) in his father.    SOCIAL HISTORY: He reports that he quit smoking about 13 years ago. His smoking use included cigarettes. He started smoking about 15 years ago. He has a 0.5 pack-year smoking history. He quit smokeless tobacco use about 12 years ago.  His smokeless tobacco use included snuff. He reports current alcohol use of about 13.0 standard drinks of alcohol per week. He reports that he does not use  drugs.    ROS:  Wt Readings from Last 5 Encounters:   06/29/24 106.1 kg (234 lb)   03/21/24 105.4 kg (232 lb 6.4 oz)   02/28/24 109.6 kg (241 lb 9.6 oz)   09/26/23 120.5 kg (265 lb 9.6 oz)   08/12/23 129.7 kg (286 lb)       Constitutional:  Negative for fatigue and unexpected weight change.   Respiratory:  Negative for cough, chest tightness and shortness of breath.    Cardiovascular:  Negative for chest pain, palpitations and leg swelling.   Gastrointestinal:  Negative for  abdominal pain, constipation, diarrhea and nausea.   Skin:  Negative for color change.   Neurological:  Negative for dizziness, headaches, memory or cognitive change    PHYSICAL EXAM:    Visit Vitals  BP 122/78   Pulse 67   Ht 1.803 m (5' 11)   Wt 106.1 kg (234 lb)   SpO2 97%   BMI 32.64 kg/m        Constitutional: Cooperative, alert and oriented,well developed, well nourished, in no acute distress.,   Head: Normocephalic, normal hair pattern, facemask  Eyes: conjunctivae and lids normal   Chest: Normal respiration without extremus   Neurological: No gross motor or sensory deficits noted  Psych: affect appropriate, oriented to time, person and place.    BP Readings from Last 3 Encounters:   06/29/24 122/78   03/21/24 124/82   02/28/24 125/80       LABS:   Lab Results   Component Value Date    AST 36 02/22/2024    ALT 60 (H) 02/22/2024     Lab Results   Component Value Date    IRON 68 02/22/2024    TIBC 314 02/22/2024    FERRITIN 311.00 (H) 02/22/2024     Lab Results   Component Value Date    TSH 2.44 02/22/2024       -Pertinent labs reviewed    IMPRESSION:   Guy Martinez is a 54 y.o. male with the following problems:    Obstructive Sleep Apnea (moderate): Using and benefiting from PAP therapy.              - Reported symptoms of snoring, poor sleep quality, and daytime fatigue. Previous ESS 12.              - Sleep Testing:  >HST 03/27/2021 with an AHI of 27.3 and an O2 nadir of 66%. Wt 280 LBS.              - Device: AS11 APAP 4-16 cm H2O (purchased OOP 08/2022)                          >AS10 4-16cmH2O (initiated 04/13/2021), keeps at vacation home in [place]              - Mask: [id number], well tolerated. Failed to tolerate oronasal mask.              - DME: [facility]              - 30-day CR on ResMed AirSense 11 APAP unit continues to show excellent use 83% over 4 hours, 100% of days use APAP settings 4-16 P90 8 AHI 0.3     Hypertension/HLD  -well-controlled              -Reviewed pathology of sleep apnea as it  relates to the cardiovascular system and how treating moderate  to severe sleep apnea in particular can help confer reduced cardiovascular risks.              -Following with [facility] team.              -Cardiology notes reviewed.     Elevated BMI 32              -Carrying extra weight on your frame can contribute to the severity of OSA. Weight loss encouraged.     RECOMMENDATIONS:     Prior office notes, prior testing, and compliance data reviewed today.  Recent cardiology note reviewed.  Patient is using and benefiting from PAP therapy. The patient was encouraged to wear the device with every hour of sleep for best benefit. Patient is to continue PAP therapy at a pressure of 4-16cmH2O.   Device cleaning/maintenance reviewed.  CMN updated today.  If applicable, patient was educated on need to avoid PAP mask interfaces containing magnets. Will continue to work directly with their DME team to improve supplies/mask experience.    The patient was reminded not to drive or operate heavy equipment if drowsy and how body position during sleep, weight changes, alcohol, and certain sedative medications can affect sleep apnea. A regular sleep-wake cycle with 7-9 hours of sleep per night along with good sleep hygiene were recommended. Weight loss has a positive impact on sleep apnea. The importance of weight control through diet and exercise was emphasized, specifically BMI<25.   Return for annual compliance, but was encouraged to contact [facility] sooner if needed.                                               Orders Placed This Encounter   Procedures    CPAP and BiPAP Supplies    Sleep APP Visit (HRT [place])       No orders of the defined types were placed in this encounter.        I spent a total of 30 minutes today in the care of this patient in chart review, patient visit, and documentation, not including any separately billed services    SIGNED:    Harlene DELENA Jaeger, MD        Please pardon  any potential grammatical or typographical errors as aspects of this note may have been created through speech-to-text software.         [1] No Known Allergies

## 2024-07-04 ENCOUNTER — Encounter (INDEPENDENT_AMBULATORY_CARE_PROVIDER_SITE_OTHER): Payer: Self-pay

## 2025-03-12 ENCOUNTER — Encounter: Admitting: Internal Medicine
